# Patient Record
Sex: Male | Born: 1935 | Race: White | Hispanic: No | Smoking: Never smoker
Health system: Southern US, Community
[De-identification: ages and names within clinical notes are randomized; demographics above are authoritative.]

## PROBLEM LIST (undated history)

## (undated) DIAGNOSIS — G454 Transient global amnesia: Secondary | ICD-10-CM

## (undated) DIAGNOSIS — M169 Osteoarthritis of hip, unspecified: Secondary | ICD-10-CM

## (undated) DIAGNOSIS — M179 Osteoarthritis of knee, unspecified: Secondary | ICD-10-CM

## (undated) DIAGNOSIS — M1710 Unilateral primary osteoarthritis, unspecified knee: Secondary | ICD-10-CM

## (undated) DIAGNOSIS — N400 Enlarged prostate without lower urinary tract symptoms: Secondary | ICD-10-CM

## (undated) DIAGNOSIS — C9110 Chronic lymphocytic leukemia of B-cell type not having achieved remission: Secondary | ICD-10-CM

## (undated) DIAGNOSIS — N319 Neuromuscular dysfunction of bladder, unspecified: Secondary | ICD-10-CM

## (undated) DIAGNOSIS — K4090 Unilateral inguinal hernia, without obstruction or gangrene, not specified as recurrent: Secondary | ICD-10-CM

## (undated) DIAGNOSIS — K862 Cyst of pancreas: Secondary | ICD-10-CM

## (undated) DIAGNOSIS — M47812 Spondylosis without myelopathy or radiculopathy, cervical region: Secondary | ICD-10-CM

## (undated) DIAGNOSIS — N4340 Spermatocele of epididymis, unspecified: Secondary | ICD-10-CM

## (undated) DIAGNOSIS — R718 Other abnormality of red blood cells: Secondary | ICD-10-CM

## (undated) HISTORY — DX: Spermatocele of epididymis, unspecified: N43.40

## (undated) HISTORY — DX: Osteoarthritis of hip, unspecified: M16.9

## (undated) HISTORY — DX: Unilateral inguinal hernia, without obstruction or gangrene, not specified as recurrent: K40.90

## (undated) HISTORY — DX: Benign prostatic hyperplasia without lower urinary tract symptoms: N40.0

## (undated) HISTORY — DX: Spondylosis without myelopathy or radiculopathy, cervical region: M47.812

## (undated) HISTORY — DX: Cyst of pancreas: K86.2

## (undated) HISTORY — PX: COLONOSCOPY: SHX174

## (undated) HISTORY — DX: Chronic lymphocytic leukemia of B-cell type not having achieved remission: C91.10

## (undated) HISTORY — DX: Neuromuscular dysfunction of bladder, unspecified: N31.9

## (undated) HISTORY — DX: Osteoarthritis of knee, unspecified: M17.9

## (undated) HISTORY — PX: BACK SURGERY: SHX140

## (undated) HISTORY — DX: Unilateral primary osteoarthritis, unspecified knee: M17.10

## (undated) HISTORY — DX: Gilbert syndrome: E80.4

## (undated) HISTORY — DX: Other abnormality of red blood cells: R71.8

## (undated) HISTORY — DX: Transient global amnesia: G45.4

---

## 1997-11-24 ENCOUNTER — Inpatient Hospital Stay (HOSPITAL_COMMUNITY): Admission: RE | Admit: 1997-11-24 | Discharge: 1997-11-26 | Payer: Self-pay | Admitting: Urology

## 1997-12-04 ENCOUNTER — Ambulatory Visit (HOSPITAL_COMMUNITY): Admission: RE | Admit: 1997-12-04 | Discharge: 1997-12-04 | Payer: Self-pay | Admitting: Neurosurgery

## 1997-12-30 ENCOUNTER — Observation Stay (HOSPITAL_COMMUNITY): Admission: RE | Admit: 1997-12-30 | Discharge: 1997-12-31 | Payer: Self-pay | Admitting: Neurosurgery

## 2001-03-01 ENCOUNTER — Ambulatory Visit (HOSPITAL_COMMUNITY): Admission: RE | Admit: 2001-03-01 | Discharge: 2001-03-01 | Payer: Self-pay | Admitting: Geriatric Medicine

## 2002-01-13 ENCOUNTER — Encounter: Payer: Self-pay | Admitting: Geriatric Medicine

## 2002-01-13 ENCOUNTER — Encounter: Admission: RE | Admit: 2002-01-13 | Discharge: 2002-01-13 | Payer: Self-pay | Admitting: Geriatric Medicine

## 2002-01-14 ENCOUNTER — Ambulatory Visit (HOSPITAL_COMMUNITY): Admission: RE | Admit: 2002-01-14 | Discharge: 2002-01-14 | Payer: Self-pay | Admitting: Geriatric Medicine

## 2002-03-14 ENCOUNTER — Encounter: Payer: Self-pay | Admitting: Internal Medicine

## 2002-03-14 ENCOUNTER — Encounter: Admission: RE | Admit: 2002-03-14 | Discharge: 2002-03-14 | Payer: Self-pay | Admitting: Internal Medicine

## 2003-05-01 ENCOUNTER — Ambulatory Visit (HOSPITAL_COMMUNITY): Admission: RE | Admit: 2003-05-01 | Discharge: 2003-05-01 | Payer: Self-pay | Admitting: Geriatric Medicine

## 2003-05-20 ENCOUNTER — Ambulatory Visit (HOSPITAL_COMMUNITY): Admission: RE | Admit: 2003-05-20 | Discharge: 2003-05-20 | Payer: Self-pay | Admitting: Gastroenterology

## 2005-03-27 HISTORY — PX: EUS: SHX5427

## 2005-05-31 ENCOUNTER — Encounter: Admission: RE | Admit: 2005-05-31 | Discharge: 2005-05-31 | Payer: Self-pay | Admitting: Geriatric Medicine

## 2005-06-06 ENCOUNTER — Encounter: Admission: RE | Admit: 2005-06-06 | Discharge: 2005-06-06 | Payer: Self-pay | Admitting: Geriatric Medicine

## 2005-06-29 ENCOUNTER — Encounter (INDEPENDENT_AMBULATORY_CARE_PROVIDER_SITE_OTHER): Payer: Self-pay | Admitting: Specialist

## 2005-06-29 ENCOUNTER — Ambulatory Visit (HOSPITAL_COMMUNITY): Admission: RE | Admit: 2005-06-29 | Discharge: 2005-06-29 | Payer: Self-pay | Admitting: Gastroenterology

## 2005-07-03 ENCOUNTER — Ambulatory Visit: Payer: Self-pay | Admitting: Gastroenterology

## 2005-07-26 ENCOUNTER — Encounter: Payer: Self-pay | Admitting: Urology

## 2005-10-23 ENCOUNTER — Ambulatory Visit: Payer: Self-pay | Admitting: Gastroenterology

## 2005-10-24 ENCOUNTER — Ambulatory Visit: Payer: Self-pay | Admitting: Cardiology

## 2005-10-27 ENCOUNTER — Encounter: Admission: RE | Admit: 2005-10-27 | Discharge: 2005-10-27 | Payer: Self-pay | Admitting: Geriatric Medicine

## 2006-04-11 ENCOUNTER — Ambulatory Visit: Payer: Self-pay | Admitting: Gastroenterology

## 2006-04-11 LAB — CONVERTED CEMR LAB
BUN: 17 mg/dL (ref 6–23)
Creatinine, Ser: 1.2 mg/dL (ref 0.4–1.5)

## 2006-04-16 ENCOUNTER — Ambulatory Visit: Payer: Self-pay | Admitting: Internal Medicine

## 2006-05-08 ENCOUNTER — Ambulatory Visit: Payer: Self-pay | Admitting: Gastroenterology

## 2006-08-28 ENCOUNTER — Emergency Department (HOSPITAL_COMMUNITY): Admission: EM | Admit: 2006-08-28 | Discharge: 2006-08-28 | Payer: Self-pay | Admitting: Emergency Medicine

## 2009-05-05 ENCOUNTER — Ambulatory Visit (HOSPITAL_COMMUNITY): Admission: RE | Admit: 2009-05-05 | Discharge: 2009-05-05 | Payer: Self-pay | Admitting: Urology

## 2009-11-03 ENCOUNTER — Encounter: Admission: RE | Admit: 2009-11-03 | Discharge: 2009-11-03 | Payer: Self-pay | Admitting: Geriatric Medicine

## 2010-04-11 LAB — CBC
HCT: 42.7 % (ref 39.0–52.0)
MCH: 32.1 pg (ref 26.0–34.0)
MCHC: 34.7 g/dL (ref 30.0–36.0)
MCV: 92.6 fL (ref 78.0–100.0)
Platelets: 298 10*3/uL (ref 150–400)
RBC: 4.61 MIL/uL (ref 4.22–5.81)
RDW: 12.8 % (ref 11.5–15.5)
WBC: 13.4 10*3/uL — ABNORMAL HIGH (ref 4.0–10.5)

## 2010-04-11 LAB — BASIC METABOLIC PANEL
BUN: 16 mg/dL (ref 6–23)
CO2: 29 mEq/L (ref 19–32)
Calcium: 9.2 mg/dL (ref 8.4–10.5)
Chloride: 99 mEq/L (ref 96–112)
Creatinine, Ser: 1.1 mg/dL (ref 0.4–1.5)
GFR calc Af Amer: >60 mL/min (ref >60)
GFR calc non Af Amer: >60 mL/min (ref >60)
Glucose, Bld: 80 mg/dL (ref 70–99)
Potassium: 3.8 mEq/L (ref 3.5–5.1)
Sodium: 136 mEq/L (ref 135–145)

## 2010-04-11 LAB — SURGICAL PCR SCREEN
MRSA, PCR: NEGATIVE
Staphylococcus aureus: NEGATIVE

## 2010-04-15 ENCOUNTER — Ambulatory Visit (HOSPITAL_COMMUNITY)
Admission: RE | Admit: 2010-04-15 | Discharge: 2010-04-15 | Payer: Self-pay | Source: Home / Self Care | Attending: Urology | Admitting: Urology

## 2010-04-17 NOTE — Op Note (Signed)
  [name], Christopher Fowler               ACCOUNT NO.:  [id number]  MEDICAL RECORD NO.:  [id number]          PATIENT TYPE:  AMB  LOCATION:  DAY                          FACILITY:  [facility]  PHYSICIAN:  Heloise Purpura, MD      DATE OF BIRTH:  14-Feb-1925  DATE OF PROCEDURE:  04/15/2010 DATE OF DISCHARGE:                              OPERATIVE REPORT   PREOPERATIVE DIAGNOSIS:  Recurrent balanitis.  POSTOPERATIVE DIAGNOSIS:  Recurrent balanitis.  PROCEDURE:  Adult circumcision.  SURGEON:  Heloise Purpura, M.D.  ANESTHESIA:  General.  COMPLICATIONS:  None.  ESTIMATED BLOOD LOSS:  Minimal.  INTRAVENOUS FLUIDS:  800 mL of crystalloid.  INDICATIONS:  [name] is an 77 year old gentleman with recent recurrent balanitis.  We discussed options for long-term management, including circumcision.  He elected to proceed and, after discussion regarding the potential risks and benefits, he gave [place] his informed consent.  DESCRIPTION OF PROCEDURE:  The patient was taken to the operating room and a general anesthetic was administered.  He was given preoperative antibiotics, placed in the supine position, and his genitalia was prepped and draped in the usual sterile fashion.  Next, a preoperative time-out was performed.  The foreskin was then examined and pulled over the glans of the penis.  The proximal and distal incision sites were then marked appropriately in a circumferential fashion.  A knife was then used to incise the skin at each of these marked incision sites circumferentially and Strully scissors were placed under the foreskin to be resected and this was divided in the midline.  Mosquito clamps were then placed on the edges of the foreskin to be resected and a Bovie electrode was used to excise the skin from the underlying dartos fascia. Hemostasis was then achieved with electrocautery where necessary.  The skin edges were then reapproximated proximally and distally at the 12, 3, 6 and 9  o'clock positions with interrupted 3-0 chromic sutures. Running 3-0 chromic sutures were then used to close the remaining incisions in each quadrant.  Xeroform gauze was then applied with a Kling dressing and Coban to keep it in place.  A penile block was then performed with 10 mL of 1% plain lidocaine.  The patient tolerated the procedure well and without complications.  He was able to be extubated and transferred to the recovery unit in satisfactory condition.     Heloise Purpura, MD     LB/MEDQ  D:  04/15/2010  T:  04/15/2010  Job:  [id number]  Electronically Signed by Heloise Purpura MD on 04/17/2010 05:27:26 PM

## 2010-06-15 LAB — CBC
HCT: 43.9 % (ref 39.0–52.0)
Hemoglobin: 14.9 g/dL (ref 13.0–17.0)
MCHC: 34 g/dL (ref 30.0–36.0)
MCV: 95.9 fL (ref 78.0–100.0)
Platelets: 260 10*3/uL (ref 150–400)
RBC: 4.57 MIL/uL (ref 4.22–5.81)
RDW: 13.5 % (ref 11.5–15.5)
WBC: 10.9 10*3/uL — ABNORMAL HIGH (ref 4.0–10.5)

## 2010-08-12 NOTE — Assessment & Plan Note (Signed)
[facility]                         GASTROENTEROLOGY OFFICE NOTE   NAME:Christopher Fowler, Christopher Fowler                      MRN:          [id number]  DATE:05/08/2006                            DOB:          1924/07/07    PRIMARY CARE PHYSICIAN:  Dr. Ann Maki T. Fowler.   GASTROINTESTINAL PROBLEM LIST:  1. Incidentally found tail of pancreas cyst.  The original computed      tomography scan March 2007 found 1.8-cm x 2.1-cm cystic lesion with      nearby punctate calcifications.  Asymptomatic.  Then EUS April 2007      similar cyst with calcifications, not enough fluid to send for      tumor markers or amylase, but FNA showed no neoplastic cells.      Computed tomography scans July 2007 as well as January 2008 showed      no growth in this lesion.   INTERVAL HISTORY:  I last saw Christopher Fowler at the time of his upper  endoscopic ultrasound about 10 months ago.  Since then 2 serial CT scans  have shown no growth of the cystic lesion in the tail of his pancreas.  He has been asymptomatic.   CURRENT MEDICATIONS:  Avodart, hydrochlorothiazide, Lexapro, Ambien,  multivitamin, aspirin.   PHYSICAL EXAMINATION:  Height 6 foot 1 inches, weight 181 pounds, blood  pressure 140/78, pulse 84.  CONSTITUTIONAL:  Generally well-appearing.  ABDOMEN:  Soft, nontender, nondistended, normal bowel sounds.   ASSESSMENT/PLAN:  A 77 year old man with incidental small tail of  pancreas cyst.   Imaging over approximately one year has shown no growth of this cyst.  The USFNA showed no dysplastic cells.  I suspect this is a serous, non-  neoplastic process and given that this was picked up incidentally and it  has shown no growth, and that FNA showed no sign of malignant cells.  I  think he could just be followed clinically without need for routine  imaging.     Rachael Fee, MD  Electronically Signed    DPJ/MedQ  DD: 05/08/2006  DT: 05/08/2006  Job #: [id number]   cc:   Christopher T.  Fowler, M.D.

## 2010-08-12 NOTE — Op Note (Signed)
NAME:  Christopher Fowler, Christopher Fowler                         ACCOUNT NO.:  [id number]   MEDICAL RECORD NO.:  [id number]                   PATIENT TYPE:  AMB   LOCATION:  ENDO                                 FACILITY:  [facility]   PHYSICIAN:  Danise Edge, M.D.                DATE OF BIRTH:  Jul 12, 1924   DATE OF PROCEDURE:  05/20/2003  DATE OF DISCHARGE:                                 OPERATIVE REPORT   PROCEDURE:  Screening colonoscopy.   REFERRED BY:  Hal T. Stoneking, M.D.   INDICATIONS FOR PROCEDURE:  Christopher Fowler is a 77 year old male born  1924/05/06.  Christopher Fowler health maintenance flexible proctosigmoidoscopy  in 2000 was normal.  He is scheduled to undergo his first screening  colonoscopy with polypectomy to prevent colon cancer.   ENDOSCOPIST:  Danise Edge, M.D.   PREMEDICATION:  Versed 5 mg, Demerol 50 mg.   DESCRIPTION OF PROCEDURE:  After obtaining informed consent, Christopher Fowler was  placed in the left lateral decubitus position. I administered intravenous  Demerol and intravenous Versed to achieve conscious sedation for the  procedure. The patient's blood pressure, oxygen saturation and cardiac  rhythm were monitored throughout the procedure and documented in the medical  record.   Anal inspection was normal. Digital rectal exam revealed a nonnodular  prostate.  The Olympus adjustable pediatric colonoscope was introduced into  the rectum and advanced to the cecum. Colonic preparation for the exam today  was excellent.   RECTUM:  Normal.   SIGMOID COLON AND DESCENDING COLON:  Normal.   SPLENIC FLEXURE:  Normal.   TRANSVERSE COLON:  Normal.   HEPATIC FLEXURE:  Normal.   ASCENDING COLON:  Normal.   CECUM AND ILEOCECAL VALVE:  Normal.   ASSESSMENT:  Normal screening proctocolonoscopy to the cecum.  No endoscopic  evidence for the presence of colorectal neoplasia.                                               Danise Edge, M.D.    MJ/MEDQ  D:  05/20/2003   T:  05/20/2003  Job:  [id number]   cc:   Hal T. Stoneking, M.D.  [address] [place], [place] [address]  Fax: [phone]

## 2011-01-12 LAB — COMPREHENSIVE METABOLIC PANEL
ALT: 20
AST: 30
Albumin: 3.3 — ABNORMAL LOW
Alkaline Phosphatase: 51
BUN: 15
CO2: 27
Calcium: 8.3 — ABNORMAL LOW
Chloride: 97
Creatinine, Ser: 1.02
GFR calc Af Amer: >60
GFR calc non Af Amer: >60
Glucose, Bld: 128 — ABNORMAL HIGH
Potassium: 3.2 — ABNORMAL LOW
Sodium: 134 — ABNORMAL LOW
Total Bilirubin: 1.7 — ABNORMAL HIGH
Total Protein: 5.9 — ABNORMAL LOW

## 2011-01-12 LAB — URINALYSIS, ROUTINE W REFLEX MICROSCOPIC
Bilirubin Urine: NEGATIVE
Glucose, UA: NEGATIVE
Hgb urine dipstick: NEGATIVE
Ketones, ur: NEGATIVE
Nitrite: NEGATIVE
Protein, ur: NEGATIVE
Specific Gravity, Urine: 1.017
Urobilinogen, UA: 1
pH: 7.5

## 2011-01-12 LAB — DIFFERENTIAL
Basophils Absolute: 0
Basophils Relative: 0
Eosinophils Absolute: 0
Eosinophils Relative: 0
Lymphocytes Relative: 9 — ABNORMAL LOW
Lymphs Abs: 1
Monocytes Absolute: 0.9 — ABNORMAL HIGH
Monocytes Relative: 7
Neutro Abs: 10.1 — ABNORMAL HIGH
Neutrophils Relative %: 84 — ABNORMAL HIGH

## 2011-01-12 LAB — CBC
HCT: 42.4
Hemoglobin: 14.7
MCHC: 34.5
MCV: 93.1
Platelets: 261
RBC: 4.56
RDW: 13.1
WBC: 12.1 — ABNORMAL HIGH

## 2011-05-22 DIAGNOSIS — L570 Actinic keratosis: Secondary | ICD-10-CM | POA: Diagnosis not present

## 2011-05-22 DIAGNOSIS — L821 Other seborrheic keratosis: Secondary | ICD-10-CM | POA: Diagnosis not present

## 2011-05-22 DIAGNOSIS — L578 Other skin changes due to chronic exposure to nonionizing radiation: Secondary | ICD-10-CM | POA: Diagnosis not present

## 2011-05-29 DIAGNOSIS — F411 Generalized anxiety disorder: Secondary | ICD-10-CM | POA: Diagnosis not present

## 2011-07-24 ENCOUNTER — Other Ambulatory Visit: Payer: Self-pay | Admitting: Geriatric Medicine

## 2011-07-24 DIAGNOSIS — Z0000 Encounter for general adult medical examination without abnormal findings: Secondary | ICD-10-CM | POA: Diagnosis not present

## 2011-07-24 DIAGNOSIS — E041 Nontoxic single thyroid nodule: Secondary | ICD-10-CM | POA: Diagnosis not present

## 2011-07-24 DIAGNOSIS — Z1331 Encounter for screening for depression: Secondary | ICD-10-CM | POA: Diagnosis not present

## 2011-07-24 DIAGNOSIS — I10 Essential (primary) hypertension: Secondary | ICD-10-CM | POA: Diagnosis not present

## 2011-07-24 DIAGNOSIS — Z79899 Other long term (current) drug therapy: Secondary | ICD-10-CM | POA: Diagnosis not present

## 2011-07-28 ENCOUNTER — Ambulatory Visit
Admission: RE | Admit: 2011-07-28 | Discharge: 2011-07-28 | Disposition: A | Discharge status: Home / Self Care | Payer: Medicare Other | Source: Ambulatory Visit | Attending: Geriatric Medicine | Admitting: Geriatric Medicine

## 2011-07-28 DIAGNOSIS — E042 Nontoxic multinodular goiter: Secondary | ICD-10-CM | POA: Diagnosis not present

## 2011-07-28 DIAGNOSIS — E041 Nontoxic single thyroid nodule: Secondary | ICD-10-CM

## 2011-09-06 DIAGNOSIS — N35919 Unspecified urethral stricture, male, unspecified site: Secondary | ICD-10-CM | POA: Diagnosis not present

## 2011-09-06 DIAGNOSIS — R339 Retention of urine, unspecified: Secondary | ICD-10-CM | POA: Diagnosis not present

## 2011-09-15 DIAGNOSIS — R319 Hematuria, unspecified: Secondary | ICD-10-CM | POA: Diagnosis not present

## 2011-09-19 DIAGNOSIS — Z79899 Other long term (current) drug therapy: Secondary | ICD-10-CM | POA: Diagnosis not present

## 2011-09-19 DIAGNOSIS — R319 Hematuria, unspecified: Secondary | ICD-10-CM | POA: Diagnosis not present

## 2011-10-26 DIAGNOSIS — F411 Generalized anxiety disorder: Secondary | ICD-10-CM | POA: Diagnosis not present

## 2011-11-13 DIAGNOSIS — H2510 Age-related nuclear cataract, unspecified eye: Secondary | ICD-10-CM | POA: Diagnosis not present

## 2012-01-22 DIAGNOSIS — M542 Cervicalgia: Secondary | ICD-10-CM | POA: Diagnosis not present

## 2012-01-22 DIAGNOSIS — J309 Allergic rhinitis, unspecified: Secondary | ICD-10-CM | POA: Diagnosis not present

## 2012-01-22 DIAGNOSIS — I10 Essential (primary) hypertension: Secondary | ICD-10-CM | POA: Diagnosis not present

## 2012-01-22 DIAGNOSIS — Z79899 Other long term (current) drug therapy: Secondary | ICD-10-CM | POA: Diagnosis not present

## 2012-01-26 DIAGNOSIS — Z79899 Other long term (current) drug therapy: Secondary | ICD-10-CM | POA: Diagnosis not present

## 2012-01-26 DIAGNOSIS — E871 Hypo-osmolality and hyponatremia: Secondary | ICD-10-CM | POA: Diagnosis not present

## 2012-02-09 DIAGNOSIS — Z23 Encounter for immunization: Secondary | ICD-10-CM | POA: Diagnosis not present

## 2012-02-26 DIAGNOSIS — J309 Allergic rhinitis, unspecified: Secondary | ICD-10-CM | POA: Diagnosis not present

## 2012-02-26 DIAGNOSIS — I10 Essential (primary) hypertension: Secondary | ICD-10-CM | POA: Diagnosis not present

## 2012-02-26 DIAGNOSIS — M549 Dorsalgia, unspecified: Secondary | ICD-10-CM | POA: Diagnosis not present

## 2012-03-11 DIAGNOSIS — F411 Generalized anxiety disorder: Secondary | ICD-10-CM | POA: Diagnosis not present

## 2012-05-13 DIAGNOSIS — L821 Other seborrheic keratosis: Secondary | ICD-10-CM | POA: Diagnosis not present

## 2012-05-13 DIAGNOSIS — L570 Actinic keratosis: Secondary | ICD-10-CM | POA: Diagnosis not present

## 2012-05-13 DIAGNOSIS — L578 Other skin changes due to chronic exposure to nonionizing radiation: Secondary | ICD-10-CM | POA: Diagnosis not present

## 2012-05-13 DIAGNOSIS — D1801 Hemangioma of skin and subcutaneous tissue: Secondary | ICD-10-CM | POA: Diagnosis not present

## 2012-05-13 DIAGNOSIS — L819 Disorder of pigmentation, unspecified: Secondary | ICD-10-CM | POA: Diagnosis not present

## 2012-07-29 DIAGNOSIS — Z0000 Encounter for general adult medical examination without abnormal findings: Secondary | ICD-10-CM | POA: Diagnosis not present

## 2012-07-29 DIAGNOSIS — Z1331 Encounter for screening for depression: Secondary | ICD-10-CM | POA: Diagnosis not present

## 2012-07-29 DIAGNOSIS — Z79899 Other long term (current) drug therapy: Secondary | ICD-10-CM | POA: Diagnosis not present

## 2012-08-08 DIAGNOSIS — F411 Generalized anxiety disorder: Secondary | ICD-10-CM | POA: Diagnosis not present

## 2012-09-03 DIAGNOSIS — J309 Allergic rhinitis, unspecified: Secondary | ICD-10-CM | POA: Diagnosis not present

## 2012-09-13 DIAGNOSIS — R339 Retention of urine, unspecified: Secondary | ICD-10-CM | POA: Diagnosis not present

## 2012-09-13 DIAGNOSIS — N35919 Unspecified urethral stricture, male, unspecified site: Secondary | ICD-10-CM | POA: Diagnosis not present

## 2012-11-28 DIAGNOSIS — R198 Other specified symptoms and signs involving the digestive system and abdomen: Secondary | ICD-10-CM | POA: Diagnosis not present

## 2012-12-02 DIAGNOSIS — K5900 Constipation, unspecified: Secondary | ICD-10-CM | POA: Diagnosis not present

## 2012-12-09 DIAGNOSIS — Z1211 Encounter for screening for malignant neoplasm of colon: Secondary | ICD-10-CM | POA: Diagnosis not present

## 2012-12-11 DIAGNOSIS — Z23 Encounter for immunization: Secondary | ICD-10-CM | POA: Diagnosis not present

## 2013-01-06 DIAGNOSIS — F411 Generalized anxiety disorder: Secondary | ICD-10-CM | POA: Diagnosis not present

## 2013-01-31 DIAGNOSIS — I10 Essential (primary) hypertension: Secondary | ICD-10-CM | POA: Diagnosis not present

## 2013-01-31 DIAGNOSIS — C9110 Chronic lymphocytic leukemia of B-cell type not having achieved remission: Secondary | ICD-10-CM | POA: Diagnosis not present

## 2013-01-31 DIAGNOSIS — M542 Cervicalgia: Secondary | ICD-10-CM | POA: Diagnosis not present

## 2013-01-31 DIAGNOSIS — Z79899 Other long term (current) drug therapy: Secondary | ICD-10-CM | POA: Diagnosis not present

## 2013-02-10 DIAGNOSIS — H2510 Age-related nuclear cataract, unspecified eye: Secondary | ICD-10-CM | POA: Diagnosis not present

## 2013-02-19 DIAGNOSIS — L570 Actinic keratosis: Secondary | ICD-10-CM | POA: Diagnosis not present

## 2013-02-19 DIAGNOSIS — Z85828 Personal history of other malignant neoplasm of skin: Secondary | ICD-10-CM | POA: Diagnosis not present

## 2013-02-19 DIAGNOSIS — L723 Sebaceous cyst: Secondary | ICD-10-CM | POA: Diagnosis not present

## 2013-02-19 DIAGNOSIS — D1801 Hemangioma of skin and subcutaneous tissue: Secondary | ICD-10-CM | POA: Diagnosis not present

## 2013-02-19 DIAGNOSIS — L821 Other seborrheic keratosis: Secondary | ICD-10-CM | POA: Diagnosis not present

## 2013-02-19 DIAGNOSIS — L739 Follicular disorder, unspecified: Secondary | ICD-10-CM | POA: Diagnosis not present

## 2013-04-07 DIAGNOSIS — F411 Generalized anxiety disorder: Secondary | ICD-10-CM | POA: Diagnosis not present

## 2013-05-02 DIAGNOSIS — N35919 Unspecified urethral stricture, male, unspecified site: Secondary | ICD-10-CM | POA: Diagnosis not present

## 2013-05-06 ENCOUNTER — Other Ambulatory Visit: Payer: Self-pay | Admitting: Geriatric Medicine

## 2013-05-06 DIAGNOSIS — M542 Cervicalgia: Secondary | ICD-10-CM

## 2013-05-06 DIAGNOSIS — M546 Pain in thoracic spine: Secondary | ICD-10-CM | POA: Diagnosis not present

## 2013-05-06 DIAGNOSIS — C9110 Chronic lymphocytic leukemia of B-cell type not having achieved remission: Secondary | ICD-10-CM | POA: Diagnosis not present

## 2013-05-06 DIAGNOSIS — Z79899 Other long term (current) drug therapy: Secondary | ICD-10-CM | POA: Diagnosis not present

## 2013-05-06 DIAGNOSIS — I10 Essential (primary) hypertension: Secondary | ICD-10-CM | POA: Diagnosis not present

## 2013-05-10 ENCOUNTER — Ambulatory Visit
Admission: RE | Admit: 2013-05-10 | Discharge: 2013-05-10 | Disposition: A | Discharge status: Home / Self Care | Payer: Medicare Other | Source: Ambulatory Visit | Attending: Geriatric Medicine | Admitting: Geriatric Medicine

## 2013-05-10 DIAGNOSIS — M546 Pain in thoracic spine: Secondary | ICD-10-CM | POA: Diagnosis not present

## 2013-05-10 DIAGNOSIS — M542 Cervicalgia: Secondary | ICD-10-CM

## 2013-05-10 DIAGNOSIS — M47812 Spondylosis without myelopathy or radiculopathy, cervical region: Secondary | ICD-10-CM | POA: Diagnosis not present

## 2013-05-23 DIAGNOSIS — M542 Cervicalgia: Secondary | ICD-10-CM | POA: Diagnosis not present

## 2013-05-23 DIAGNOSIS — I10 Essential (primary) hypertension: Secondary | ICD-10-CM | POA: Diagnosis not present

## 2013-05-23 DIAGNOSIS — Z6829 Body mass index (BMI) 29.0-29.9, adult: Secondary | ICD-10-CM | POA: Diagnosis not present

## 2013-05-23 DIAGNOSIS — M47812 Spondylosis without myelopathy or radiculopathy, cervical region: Secondary | ICD-10-CM | POA: Diagnosis not present

## 2013-05-27 DIAGNOSIS — R293 Abnormal posture: Secondary | ICD-10-CM | POA: Diagnosis not present

## 2013-05-27 DIAGNOSIS — M2560 Stiffness of unspecified joint, not elsewhere classified: Secondary | ICD-10-CM | POA: Diagnosis not present

## 2013-05-27 DIAGNOSIS — M542 Cervicalgia: Secondary | ICD-10-CM | POA: Diagnosis not present

## 2013-05-28 DIAGNOSIS — M2560 Stiffness of unspecified joint, not elsewhere classified: Secondary | ICD-10-CM | POA: Diagnosis not present

## 2013-05-28 DIAGNOSIS — R293 Abnormal posture: Secondary | ICD-10-CM | POA: Diagnosis not present

## 2013-05-28 DIAGNOSIS — M542 Cervicalgia: Secondary | ICD-10-CM | POA: Diagnosis not present

## 2013-05-29 DIAGNOSIS — R293 Abnormal posture: Secondary | ICD-10-CM | POA: Diagnosis not present

## 2013-05-29 DIAGNOSIS — M542 Cervicalgia: Secondary | ICD-10-CM | POA: Diagnosis not present

## 2013-05-29 DIAGNOSIS — M2560 Stiffness of unspecified joint, not elsewhere classified: Secondary | ICD-10-CM | POA: Diagnosis not present

## 2013-06-02 DIAGNOSIS — M542 Cervicalgia: Secondary | ICD-10-CM | POA: Diagnosis not present

## 2013-06-02 DIAGNOSIS — M2560 Stiffness of unspecified joint, not elsewhere classified: Secondary | ICD-10-CM | POA: Diagnosis not present

## 2013-06-02 DIAGNOSIS — R293 Abnormal posture: Secondary | ICD-10-CM | POA: Diagnosis not present

## 2013-06-02 DIAGNOSIS — R030 Elevated blood-pressure reading, without diagnosis of hypertension: Secondary | ICD-10-CM | POA: Diagnosis not present

## 2013-06-11 DIAGNOSIS — I10 Essential (primary) hypertension: Secondary | ICD-10-CM | POA: Diagnosis not present

## 2013-06-11 DIAGNOSIS — M542 Cervicalgia: Secondary | ICD-10-CM | POA: Diagnosis not present

## 2013-07-11 DIAGNOSIS — J309 Allergic rhinitis, unspecified: Secondary | ICD-10-CM | POA: Diagnosis not present

## 2013-07-23 DIAGNOSIS — F411 Generalized anxiety disorder: Secondary | ICD-10-CM | POA: Diagnosis not present

## 2013-08-01 ENCOUNTER — Other Ambulatory Visit: Payer: Self-pay | Admitting: Geriatric Medicine

## 2013-08-01 DIAGNOSIS — Z23 Encounter for immunization: Secondary | ICD-10-CM | POA: Diagnosis not present

## 2013-08-01 DIAGNOSIS — Z0000 Encounter for general adult medical examination without abnormal findings: Secondary | ICD-10-CM | POA: Diagnosis not present

## 2013-08-01 DIAGNOSIS — I10 Essential (primary) hypertension: Secondary | ICD-10-CM | POA: Diagnosis not present

## 2013-08-01 DIAGNOSIS — E041 Nontoxic single thyroid nodule: Secondary | ICD-10-CM

## 2013-08-01 DIAGNOSIS — Z1331 Encounter for screening for depression: Secondary | ICD-10-CM | POA: Diagnosis not present

## 2013-08-01 DIAGNOSIS — R634 Abnormal weight loss: Secondary | ICD-10-CM | POA: Diagnosis not present

## 2013-08-01 DIAGNOSIS — Z79899 Other long term (current) drug therapy: Secondary | ICD-10-CM | POA: Diagnosis not present

## 2013-08-04 ENCOUNTER — Ambulatory Visit
Admission: RE | Admit: 2013-08-04 | Discharge: 2013-08-04 | Disposition: A | Discharge status: Home / Self Care | Payer: Medicare Other | Source: Ambulatory Visit | Attending: Geriatric Medicine | Admitting: Geriatric Medicine

## 2013-08-04 DIAGNOSIS — E041 Nontoxic single thyroid nodule: Secondary | ICD-10-CM

## 2013-09-12 DIAGNOSIS — M542 Cervicalgia: Secondary | ICD-10-CM | POA: Diagnosis not present

## 2013-09-12 DIAGNOSIS — R634 Abnormal weight loss: Secondary | ICD-10-CM | POA: Diagnosis not present

## 2013-09-12 DIAGNOSIS — I10 Essential (primary) hypertension: Secondary | ICD-10-CM | POA: Diagnosis not present

## 2013-09-19 DIAGNOSIS — N476 Balanoposthitis: Secondary | ICD-10-CM | POA: Diagnosis not present

## 2013-09-19 DIAGNOSIS — R339 Retention of urine, unspecified: Secondary | ICD-10-CM | POA: Diagnosis not present

## 2013-09-19 DIAGNOSIS — N400 Enlarged prostate without lower urinary tract symptoms: Secondary | ICD-10-CM | POA: Diagnosis not present

## 2013-09-22 DIAGNOSIS — L570 Actinic keratosis: Secondary | ICD-10-CM | POA: Diagnosis not present

## 2013-09-22 DIAGNOSIS — L820 Inflamed seborrheic keratosis: Secondary | ICD-10-CM | POA: Diagnosis not present

## 2013-09-22 DIAGNOSIS — Z85828 Personal history of other malignant neoplasm of skin: Secondary | ICD-10-CM | POA: Diagnosis not present

## 2013-09-24 DIAGNOSIS — F411 Generalized anxiety disorder: Secondary | ICD-10-CM | POA: Diagnosis not present

## 2013-12-10 DIAGNOSIS — Z23 Encounter for immunization: Secondary | ICD-10-CM | POA: Diagnosis not present

## 2013-12-29 DIAGNOSIS — N359 Urethral stricture, unspecified: Secondary | ICD-10-CM | POA: Diagnosis not present

## 2013-12-29 DIAGNOSIS — R339 Retention of urine, unspecified: Secondary | ICD-10-CM | POA: Diagnosis not present

## 2013-12-29 DIAGNOSIS — N400 Enlarged prostate without lower urinary tract symptoms: Secondary | ICD-10-CM | POA: Diagnosis not present

## 2014-01-26 DIAGNOSIS — F411 Generalized anxiety disorder: Secondary | ICD-10-CM | POA: Diagnosis not present

## 2014-02-23 DIAGNOSIS — D225 Melanocytic nevi of trunk: Secondary | ICD-10-CM | POA: Diagnosis not present

## 2014-02-23 DIAGNOSIS — D1801 Hemangioma of skin and subcutaneous tissue: Secondary | ICD-10-CM | POA: Diagnosis not present

## 2014-02-23 DIAGNOSIS — L814 Other melanin hyperpigmentation: Secondary | ICD-10-CM | POA: Diagnosis not present

## 2014-02-23 DIAGNOSIS — L821 Other seborrheic keratosis: Secondary | ICD-10-CM | POA: Diagnosis not present

## 2014-02-23 DIAGNOSIS — L570 Actinic keratosis: Secondary | ICD-10-CM | POA: Diagnosis not present

## 2014-02-23 DIAGNOSIS — Z85828 Personal history of other malignant neoplasm of skin: Secondary | ICD-10-CM | POA: Diagnosis not present

## 2014-02-24 DIAGNOSIS — N390 Urinary tract infection, site not specified: Secondary | ICD-10-CM | POA: Diagnosis not present

## 2014-03-25 DIAGNOSIS — Z79899 Other long term (current) drug therapy: Secondary | ICD-10-CM | POA: Diagnosis not present

## 2014-03-25 DIAGNOSIS — M791 Myalgia: Secondary | ICD-10-CM | POA: Diagnosis not present

## 2014-03-25 DIAGNOSIS — I10 Essential (primary) hypertension: Secondary | ICD-10-CM | POA: Diagnosis not present

## 2014-03-25 DIAGNOSIS — K219 Gastro-esophageal reflux disease without esophagitis: Secondary | ICD-10-CM | POA: Diagnosis not present

## 2014-04-20 DIAGNOSIS — H25043 Posterior subcapsular polar age-related cataract, bilateral: Secondary | ICD-10-CM | POA: Diagnosis not present

## 2014-05-04 DIAGNOSIS — K219 Gastro-esophageal reflux disease without esophagitis: Secondary | ICD-10-CM | POA: Diagnosis not present

## 2014-05-04 DIAGNOSIS — F458 Other somatoform disorders: Secondary | ICD-10-CM | POA: Diagnosis not present

## 2014-05-22 DIAGNOSIS — C4449 Other specified malignant neoplasm of skin of scalp and neck: Secondary | ICD-10-CM | POA: Diagnosis not present

## 2014-05-22 DIAGNOSIS — D492 Neoplasm of unspecified behavior of bone, soft tissue, and skin: Secondary | ICD-10-CM | POA: Diagnosis not present

## 2014-05-22 DIAGNOSIS — Z85828 Personal history of other malignant neoplasm of skin: Secondary | ICD-10-CM | POA: Diagnosis not present

## 2014-05-22 DIAGNOSIS — L820 Inflamed seborrheic keratosis: Secondary | ICD-10-CM | POA: Diagnosis not present

## 2014-06-01 DIAGNOSIS — C490 Malignant neoplasm of connective and soft tissue of head, face and neck: Secondary | ICD-10-CM | POA: Diagnosis not present

## 2014-06-01 DIAGNOSIS — Z85828 Personal history of other malignant neoplasm of skin: Secondary | ICD-10-CM | POA: Diagnosis not present

## 2014-06-01 DIAGNOSIS — C4442 Squamous cell carcinoma of skin of scalp and neck: Secondary | ICD-10-CM | POA: Diagnosis not present

## 2014-06-25 DIAGNOSIS — F411 Generalized anxiety disorder: Secondary | ICD-10-CM | POA: Diagnosis not present

## 2014-07-31 DIAGNOSIS — K219 Gastro-esophageal reflux disease without esophagitis: Secondary | ICD-10-CM | POA: Diagnosis not present

## 2014-08-25 ENCOUNTER — Ambulatory Visit (INDEPENDENT_AMBULATORY_CARE_PROVIDER_SITE_OTHER): Payer: Medicare Other | Admitting: Family Medicine

## 2014-08-25 VITALS — BP 128/72 | HR 81 | Temp 98.4°F | Resp 16 | Ht 72.5 in | Wt 167.8 lb

## 2014-08-25 DIAGNOSIS — W540XXA Bitten by dog, initial encounter: Secondary | ICD-10-CM

## 2014-08-25 DIAGNOSIS — L03113 Cellulitis of right upper limb: Secondary | ICD-10-CM

## 2014-08-25 DIAGNOSIS — T148 Other injury of unspecified body region: Secondary | ICD-10-CM

## 2014-08-25 MED ORDER — AMOXICILLIN-POT CLAVULANATE 875-125 MG PO TABS: 1.0000 | ORAL_TABLET | Freq: Two times a day (BID) | ORAL | Status: DC

## 2014-08-25 MED ORDER — MUPIROCIN 2 % EX OINT: 1.0000 "application " | TOPICAL_OINTMENT | Freq: Three times a day (TID) | CUTANEOUS | Status: DC

## 2014-08-25 NOTE — Progress Notes (Signed)
  Subjective:  Patient ID: Christopher Bile., male    DOB: 04/02/1924  Age: 81 y.o. MRN: [id number]  81 year old man who reached down to pet a dog that his daughter is caring for. The dog has a lot of pain due to cancer. The dog bit the patient on the right hand, the dorsum of the hand overlying the first and second metatarsals and a little around into the webspace area. This happened on Friday night almost 4 days ago. It was a holiday weekend. The patient intended to see his doctor but he found out that his doctor did not care for animal bite injuries and was sent over here. The patient did have a tetanus shot sometime in the last couple of years. The dog is cared for by his daughter who lives in [place]. The dog is a rescue dog that has a malignancy, and she is providing individualize hospice care for the dog. The dog is fully vaccinated. The dog has returned to [place].  Objective:  There are half a dozen bite marks in the back of the right hand. The largest one is about 1 cm in diameter with torn skin that is healing back and is crusted a little. There is some surrounding erythema.. The other marks or less apparent except for one that has a couple of tabs of skin folded back from it. The patient's hand is intact. Assessment & Plan:   Assessment: Dog bite right hand with mild cellulitis  Plan: There are no Patient Instructions on file for this visit.   HOPPER,DAVID, MD 08/25/2014

## 2014-08-25 NOTE — Patient Instructions (Signed)
.  Augmentin 875 twice a day for 1 week  Wash twice daily with soap and water and apply some mupirocin ointment. Continue this until the wound is looking nice and close to resolution  Return if needed  Informed your daughter that the dog needs to be kept under close observation for a minimum of 10-14 days  This is being reported to the health department authorities, and you may be contacted regarding it.

## 2014-09-18 ENCOUNTER — Other Ambulatory Visit: Payer: Self-pay | Admitting: Geriatric Medicine

## 2014-09-18 DIAGNOSIS — I10 Essential (primary) hypertension: Secondary | ICD-10-CM | POA: Diagnosis not present

## 2014-09-18 DIAGNOSIS — E041 Nontoxic single thyroid nodule: Secondary | ICD-10-CM

## 2014-09-18 DIAGNOSIS — N401 Enlarged prostate with lower urinary tract symptoms: Secondary | ICD-10-CM | POA: Diagnosis not present

## 2014-09-18 DIAGNOSIS — Z79899 Other long term (current) drug therapy: Secondary | ICD-10-CM | POA: Diagnosis not present

## 2014-09-18 DIAGNOSIS — R339 Retention of urine, unspecified: Secondary | ICD-10-CM | POA: Diagnosis not present

## 2014-09-18 DIAGNOSIS — Z0000 Encounter for general adult medical examination without abnormal findings: Secondary | ICD-10-CM | POA: Diagnosis not present

## 2014-09-18 DIAGNOSIS — R3916 Straining to void: Secondary | ICD-10-CM | POA: Diagnosis not present

## 2014-09-18 DIAGNOSIS — N359 Urethral stricture, unspecified: Secondary | ICD-10-CM | POA: Diagnosis not present

## 2014-09-18 DIAGNOSIS — C9110 Chronic lymphocytic leukemia of B-cell type not having achieved remission: Secondary | ICD-10-CM | POA: Diagnosis not present

## 2014-09-18 DIAGNOSIS — Z1389 Encounter for screening for other disorder: Secondary | ICD-10-CM | POA: Diagnosis not present

## 2014-09-23 ENCOUNTER — Ambulatory Visit
Admission: RE | Admit: 2014-09-23 | Discharge: 2014-09-23 | Disposition: A | Discharge status: Home / Self Care | Payer: Medicare Other | Source: Ambulatory Visit | Attending: Geriatric Medicine | Admitting: Geriatric Medicine

## 2014-09-23 DIAGNOSIS — E041 Nontoxic single thyroid nodule: Secondary | ICD-10-CM

## 2014-09-23 DIAGNOSIS — E042 Nontoxic multinodular goiter: Secondary | ICD-10-CM | POA: Diagnosis not present

## 2014-10-16 DIAGNOSIS — F411 Generalized anxiety disorder: Secondary | ICD-10-CM | POA: Diagnosis not present

## 2014-12-19 DIAGNOSIS — Z23 Encounter for immunization: Secondary | ICD-10-CM | POA: Diagnosis not present

## 2015-02-25 DIAGNOSIS — D485 Neoplasm of uncertain behavior of skin: Secondary | ICD-10-CM | POA: Diagnosis not present

## 2015-02-25 DIAGNOSIS — L570 Actinic keratosis: Secondary | ICD-10-CM | POA: Diagnosis not present

## 2015-02-25 DIAGNOSIS — L821 Other seborrheic keratosis: Secondary | ICD-10-CM | POA: Diagnosis not present

## 2015-02-25 DIAGNOSIS — D1801 Hemangioma of skin and subcutaneous tissue: Secondary | ICD-10-CM | POA: Diagnosis not present

## 2015-02-25 DIAGNOSIS — L820 Inflamed seborrheic keratosis: Secondary | ICD-10-CM | POA: Diagnosis not present

## 2015-02-25 DIAGNOSIS — Z85828 Personal history of other malignant neoplasm of skin: Secondary | ICD-10-CM | POA: Diagnosis not present

## 2015-03-12 DIAGNOSIS — C9110 Chronic lymphocytic leukemia of B-cell type not having achieved remission: Secondary | ICD-10-CM | POA: Diagnosis not present

## 2015-03-12 DIAGNOSIS — J309 Allergic rhinitis, unspecified: Secondary | ICD-10-CM | POA: Diagnosis not present

## 2015-03-12 DIAGNOSIS — I10 Essential (primary) hypertension: Secondary | ICD-10-CM | POA: Diagnosis not present

## 2015-03-12 DIAGNOSIS — Z79899 Other long term (current) drug therapy: Secondary | ICD-10-CM | POA: Diagnosis not present

## 2015-03-15 DIAGNOSIS — F411 Generalized anxiety disorder: Secondary | ICD-10-CM | POA: Diagnosis not present

## 2015-06-01 DIAGNOSIS — Z0000 Encounter for general adult medical examination without abnormal findings: Secondary | ICD-10-CM | POA: Diagnosis not present

## 2015-06-01 DIAGNOSIS — B965 Pseudomonas (aeruginosa) (mallei) (pseudomallei) as the cause of diseases classified elsewhere: Secondary | ICD-10-CM | POA: Diagnosis not present

## 2015-06-01 DIAGNOSIS — N390 Urinary tract infection, site not specified: Secondary | ICD-10-CM | POA: Diagnosis not present

## 2015-06-01 DIAGNOSIS — R300 Dysuria: Secondary | ICD-10-CM | POA: Diagnosis not present

## 2015-06-11 DIAGNOSIS — C9110 Chronic lymphocytic leukemia of B-cell type not having achieved remission: Secondary | ICD-10-CM | POA: Diagnosis not present

## 2015-08-13 DIAGNOSIS — C9110 Chronic lymphocytic leukemia of B-cell type not having achieved remission: Secondary | ICD-10-CM | POA: Diagnosis not present

## 2015-09-06 DIAGNOSIS — F411 Generalized anxiety disorder: Secondary | ICD-10-CM | POA: Diagnosis not present

## 2015-09-10 DIAGNOSIS — H2513 Age-related nuclear cataract, bilateral: Secondary | ICD-10-CM | POA: Diagnosis not present

## 2015-09-24 DIAGNOSIS — N401 Enlarged prostate with lower urinary tract symptoms: Secondary | ICD-10-CM | POA: Diagnosis not present

## 2015-09-24 DIAGNOSIS — R338 Other retention of urine: Secondary | ICD-10-CM | POA: Diagnosis not present

## 2015-09-24 DIAGNOSIS — N359 Urethral stricture, unspecified: Secondary | ICD-10-CM | POA: Diagnosis not present

## 2015-10-06 DIAGNOSIS — C9110 Chronic lymphocytic leukemia of B-cell type not having achieved remission: Secondary | ICD-10-CM | POA: Diagnosis not present

## 2015-10-19 ENCOUNTER — Encounter: Payer: Self-pay | Admitting: Hematology and Oncology

## 2015-10-19 DIAGNOSIS — Z79899 Other long term (current) drug therapy: Secondary | ICD-10-CM | POA: Diagnosis not present

## 2015-10-19 DIAGNOSIS — I10 Essential (primary) hypertension: Secondary | ICD-10-CM | POA: Diagnosis not present

## 2015-10-19 DIAGNOSIS — E041 Nontoxic single thyroid nodule: Secondary | ICD-10-CM | POA: Diagnosis not present

## 2015-10-19 DIAGNOSIS — F325 Major depressive disorder, single episode, in full remission: Secondary | ICD-10-CM | POA: Diagnosis not present

## 2015-10-19 DIAGNOSIS — Z0000 Encounter for general adult medical examination without abnormal findings: Secondary | ICD-10-CM | POA: Diagnosis not present

## 2015-10-19 DIAGNOSIS — C9110 Chronic lymphocytic leukemia of B-cell type not having achieved remission: Secondary | ICD-10-CM | POA: Diagnosis not present

## 2015-10-20 ENCOUNTER — Other Ambulatory Visit: Payer: Self-pay | Admitting: Geriatric Medicine

## 2015-10-20 DIAGNOSIS — E041 Nontoxic single thyroid nodule: Secondary | ICD-10-CM

## 2015-10-22 ENCOUNTER — Ambulatory Visit
Admission: RE | Admit: 2015-10-22 | Discharge: 2015-10-22 | Disposition: A | Discharge status: Home / Self Care | Payer: Medicare Other | Source: Ambulatory Visit | Attending: Geriatric Medicine | Admitting: Geriatric Medicine

## 2015-10-22 DIAGNOSIS — E042 Nontoxic multinodular goiter: Secondary | ICD-10-CM | POA: Diagnosis not present

## 2015-10-22 DIAGNOSIS — E041 Nontoxic single thyroid nodule: Secondary | ICD-10-CM

## 2015-10-28 ENCOUNTER — Other Ambulatory Visit (HOSPITAL_COMMUNITY)
Admission: RE | Admit: 2015-10-28 | Discharge: 2015-10-28 | Disposition: A | Discharge status: Home / Self Care | Payer: Medicare Other | Source: Ambulatory Visit | Attending: Hematology and Oncology | Admitting: Hematology and Oncology

## 2015-10-28 ENCOUNTER — Encounter: Payer: Self-pay | Admitting: Hematology and Oncology

## 2015-10-28 ENCOUNTER — Ambulatory Visit (HOSPITAL_BASED_OUTPATIENT_CLINIC_OR_DEPARTMENT_OTHER): Payer: Medicare Other

## 2015-10-28 ENCOUNTER — Ambulatory Visit (HOSPITAL_BASED_OUTPATIENT_CLINIC_OR_DEPARTMENT_OTHER): Payer: Medicare Other | Admitting: Hematology and Oncology

## 2015-10-28 ENCOUNTER — Telehealth: Payer: Self-pay | Admitting: Hematology and Oncology

## 2015-10-28 DIAGNOSIS — D72820 Lymphocytosis (symptomatic): Secondary | ICD-10-CM

## 2015-10-28 DIAGNOSIS — D649 Anemia, unspecified: Secondary | ICD-10-CM | POA: Diagnosis not present

## 2015-10-28 DIAGNOSIS — C9110 Chronic lymphocytic leukemia of B-cell type not having achieved remission: Secondary | ICD-10-CM | POA: Diagnosis not present

## 2015-10-28 DIAGNOSIS — D638 Anemia in other chronic diseases classified elsewhere: Secondary | ICD-10-CM

## 2015-10-28 LAB — CBC & DIFF AND RETIC
BASO%: 0.8 % (ref 0.0–2.0)
BASOS ABS: 0.3 10*3/uL — AB (ref 0.0–0.1)
EOS ABS: 0.3 10*3/uL (ref 0.0–0.5)
EOS%: 0.6 % (ref 0.0–7.0)
HEMATOCRIT: 36.9 % — AB (ref 38.4–49.9)
HGB: 12 g/dL — ABNORMAL LOW (ref 13.0–17.1)
IMMATURE RETIC FRACT: 3.1 % (ref 3.00–10.60)
LYMPH%: 86.2 % — AB (ref 14.0–49.0)
MCH: 29.6 pg (ref 27.2–33.4)
MCHC: 32.5 g/dL (ref 32.0–36.0)
MCV: 91.1 fL (ref 79.3–98.0)
MONO#: 0.2 10*3/uL (ref 0.1–0.9)
MONO%: 0.5 % (ref 0.0–14.0)
NEUT#: 5 10*3/uL (ref 1.5–6.5)
NEUT%: 11.9 % — AB (ref 39.0–75.0)
PLATELETS: 144 10*3/uL (ref 140–400)
RBC: 4.05 10*6/uL — ABNORMAL LOW (ref 4.20–5.82)
RDW: 14.5 % (ref 11.0–14.6)
Retic %: 0.77 % — ABNORMAL LOW (ref 0.80–1.80)
Retic Ct Abs: 31.19 10*3/uL — ABNORMAL LOW (ref 34.80–93.90)
WBC: 41.8 10*3/uL — AB (ref 4.0–10.3)
lymph#: 36 10*3/uL — ABNORMAL HIGH (ref 0.9–3.3)

## 2015-10-28 LAB — LACTATE DEHYDROGENASE: LDH: 192 U/L (ref 125–245)

## 2015-10-28 LAB — COMPREHENSIVE METABOLIC PANEL
ALT: 19 U/L (ref 0–55)
AST: 33 U/L (ref 5–34)
Albumin: 3.8 g/dL (ref 3.5–5.0)
Alkaline Phosphatase: 82 U/L (ref 40–150)
Anion Gap: 8 mEq/L (ref 3–11)
BUN: 22.9 mg/dL (ref 7.0–26.0)
CALCIUM: 8.8 mg/dL (ref 8.4–10.4)
CO2: 29 meq/L (ref 22–29)
Chloride: 99 mEq/L (ref 98–109)
Creatinine: 1.2 mg/dL (ref 0.7–1.3)
EGFR: 53 mL/min/{1.73_m2} — AB (ref >90)
Glucose: 97 mg/dl (ref 70–140)
POTASSIUM: 3.8 meq/L (ref 3.5–5.1)
Sodium: 136 mEq/L (ref 136–145)
Total Bilirubin: 1.57 mg/dL — ABNORMAL HIGH (ref 0.20–1.20)
Total Protein: 6.6 g/dL (ref 6.4–8.3)

## 2015-10-28 LAB — TECHNOLOGIST REVIEW

## 2015-10-28 NOTE — Progress Notes (Signed)
[facility] NOTE  Patient Care Team: Lajean Manes, MD as PCP - General (Internal Medicine)  CHIEF COMPLAINTS/PURPOSE OF CONSULTATION:  Chronic leukocytosis, suspect CLL  HISTORY OF PRESENTING ILLNESS:  Christopher Fowler. 82 y.o. male is here because of elevated WBC.  He was found to have abnormal CBC from abnormal CBC from routine blood work monitoring. I have the opportunity to review his CBC from 2008. He had mildly elevated leukocytosis but without lymphocytosis. According to blood work from his primary care physician office over the past 6 months, he had evidence of anemia and leukocytosis with white blood cell count as high as 35.9 and hemoglobin of 12.5 He denies recent infection. The last prescription antibiotics was more than 3 months ago There is not reported symptoms of sinus congestion, cough, urinary frequency/urgency or dysuria, diarrhea, joint swelling/pain or abnormal skin rash.  He had no prior history or diagnosis of cancer. His age appropriate screening programs are up-to-date. The patient has no prior diagnosis of autoimmune disease and was not prescribed corticosteroids related products. MEDICAL HISTORY:  Past Medical History:  Diagnosis Date  . BPH (benign prostatic hyperplasia)   . Degenerative joint disease (DJD) of hip    hip  . DJD (degenerative joint disease) of cervical spine   . DJD (degenerative joint disease) of knee    left  . Elevated MCV   . Gilbert's syndrome   . Hernia, inguinal    left  . Neurogenic bladder   . Pancreatic cyst    2cm negative needle aspiration  . Spermatocele    right  . 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 BACK SURGERY     L4 herniated disk    SOCIAL HISTORY: Social History   Social History  . Marital status: Married    Spouse name: [place]  . Number of children: 1  . Years of education: N/A   Occupational  History  . retired    Social History Main Topics  . Smoking status: Never Smoker  . Smokeless tobacco: Never Used  . Alcohol use 1.8 oz/week    3 Glasses of wine per week  . Drug use: No  . Sexual activity: Not on file   Other Topics Concern  . Not on file   Social History Narrative  . No narrative on file    FAMILY HISTORY: Family History  Problem Relation Age of Onset  . Cancer Mother 54    breast ca  . Cancer Paternal Uncle 34    unknown    ALLERGIES:  is allergic to sulfa antibiotics.  MEDICATIONS:  Current Outpatient Prescriptions  Medication Sig Dispense Refill  . aspirin 81 MG tablet Take 81 mg by mouth daily.    Marland Kitchen escitalopram (LEXAPRO) 20 MG tablet Take 20 mg by mouth daily.    . finasteride (PROSCAR) 5 MG tablet Take 5 mg by mouth daily.    . hydrochlorothiazide (MICROZIDE) 12.5 MG capsule Take 12.5 mg by mouth daily.    . Multiple Vitamins-Minerals (MULTIVITAMIN WITH MINERALS) tablet Take 1 tablet by mouth daily.    . polyethylene glycol (MIRALAX / GLYCOLAX) packet Take 17 g by mouth 2 (two) times daily.    . temazepam (RESTORIL) 30 MG capsule Take 30 mg by mouth at bedtime as needed for sleep.    Marland Kitchen VITAMIN B1-B12 IM Inject into the muscle every 30 (thirty) days.     No current  facility-administered medications for this visit.     REVIEW OF SYSTEMS:   Constitutional: Denies fevers, chills or abnormal night sweats Eyes: Denies blurriness of vision, double vision or watery eyes Ears, nose, mouth, throat, and face: Denies mucositis or sore throat Respiratory: Denies cough, dyspnea or wheezes Cardiovascular: Denies palpitation, chest discomfort or lower extremity swelling Gastrointestinal:  Denies nausea, heartburn or change in bowel habits Skin: Denies abnormal skin rashes Lymphatics: Denies new lymphadenopathy or easy bruising Neurological:Denies numbness, tingling or new weaknesses Behavioral/Psych: Mood is stable, no new changes  All other systems  were reviewed with the patient and are negative.  PHYSICAL EXAMINATION: ECOG PERFORMANCE STATUS: 0 - Asymptomatic  Vitals:   10/28/15 1333  BP: 123/60  Pulse: 91  Resp: 18  Temp: 98.1 F (36.7 C)   Filed Weights   10/28/15 1333  Weight: 166 lb 11.2 oz (75.6 kg)    GENERAL:alert, no distress and comfortable SKIN: skin color, texture, turgor are normal, no rashes or significant lesions EYES: normal, conjunctiva are pink and non-injected, sclera clear OROPHARYNX:no exudate, no erythema and lips, buccal mucosa, and tongue normal  NECK: supple, thyroid normal size, non-tender, without nodularity LYMPH:  no palpable lymphadenopathy in the cervical, axillary or inguinal LUNGS: clear to auscultation and percussion with normal breathing effort HEART: regular rate & rhythm and no murmurs and no lower extremity edema ABDOMEN:abdomen soft, non-tender and normal bowel sounds Musculoskeletal:no cyanosis of digits and no clubbing  PSYCH: alert & oriented x 3 with fluent speech NEURO: no focal motor/sensory deficits  LABORATORY DATA:  I have reviewed the data as listed Recent Results (from the past 2160 hour(s))  CBC & Diff and Retic     Status: Abnormal   Collection Time: 10/28/15  3:07 PM  Result Value Ref Range   WBC 41.8 (H) 4.0 - 10.3 10e3/uL   NEUT# 5.0 1.5 - 6.5 10e3/uL   HGB 12.0 (L) 13.0 - 17.1 g/dL   HCT 36.9 (L) 38.4 - 49.9 %   Platelets 144 140 - 400 10e3/uL   MCV 91.1 79.3 - 98.0 fL   MCH 29.6 27.2 - 33.4 pg   MCHC 32.5 32.0 - 36.0 g/dL   RBC 4.05 (L) 4.20 - 5.82 10e6/uL   RDW 14.5 11.0 - 14.6 %   lymph# 36.0 (H) 0.9 - 3.3 10e3/uL   MONO# 0.2 0.1 - 0.9 10e3/uL   Eosinophils Absolute 0.3 0.0 - 0.5 10e3/uL   Basophils Absolute 0.3 (H) 0.0 - 0.1 10e3/uL   NEUT% 11.9 (L) 39.0 - 75.0 %   LYMPH% 86.2 (H) 14.0 - 49.0 %   MONO% 0.5 0.0 - 14.0 %   EOS% 0.6 0.0 - 7.0 %   BASO% 0.8 0.0 - 2.0 %   Retic % 0.77 (L) 0.80 - 1.80 %   Retic Ct Abs 31.19 (L) 34.80 - 93.90 10e3/uL    Immature Retic Fract 3.10 3.00 - 10.60 %  Comprehensive metabolic panel     Status: Abnormal   Collection Time: 10/28/15  3:07 PM  Result Value Ref Range   Sodium 136 136 - 145 mEq/L   Potassium 3.8 3.5 - 5.1 mEq/L   Chloride 99 98 - 109 mEq/L   CO2 29 22 - 29 mEq/L   Glucose 97 70 - 140 mg/dl    Comment: Glucose reference range is for nonfasting patients. Fasting glucose reference range is 70- 100.   BUN 22.9 7.0 - 26.0 mg/dL   Creatinine 1.2 0.7 - 1.3 mg/dL  Total Bilirubin 1.57 (H) 0.20 - 1.20 mg/dL   Alkaline Phosphatase 82 40 - 150 U/L   AST 33 5 - 34 U/L   ALT 19 0 - 55 U/L   Total Protein 6.6 6.4 - 8.3 g/dL   Albumin 3.8 3.5 - 5.0 g/dL   Calcium 8.8 8.4 - 10.4 mg/dL   Anion Gap 8 3 - 11 mEq/L   EGFR 53 (L) >90 ml/min/1.73 m2    Comment: eGFR is calculated using the CKD-EPI Creatinine Equation (2009)  Lactate dehydrogenase (LDH)     Status: None   Collection Time: 10/28/15  3:07 PM  Result Value Ref Range   LDH 192 125 - 245 U/L  TECHNOLOGIST REVIEW     Status: None   Collection Time: 10/28/15  3:07 PM  Result Value Ref Range   Technologist Review      Large Variant lymphs present, many cleaved and/or with nucleoli    RADIOGRAPHIC STUDIES: I have personally reviewed the radiological images as listed and agreed with the findings in the report. US Thyroid  Result Date: 10/23/2015 CLINICAL DATA:  Follow-up thyroid nodules. EXAM: THYROID ULTRASOUND TECHNIQUE: Ultrasound examination of the thyroid gland and adjacent soft tissues was performed. COMPARISON:  09/23/2014 FINDINGS: Right thyroid lobe Measurements: 5.2 x 1.9 x 1.9 cm. At least three small heterogeneous nodules in the right thyroid lobe. Largest nodule is in the mid aspect and measures 0.7 x 0.4 x 0.3 cm. Nodule along the inferior right thyroid lobe measures 0.6 x 0.5 x 0.4 cm. Left thyroid lobe Measurements: 4.7 x 2.1 x 2.0 cm. Left thyroid tissue is mildly heterogeneous. There is a heterogeneous nodule along the  superior aspect that measures 1.0 x 0.7 x 0.5 cm. This nodule appears to be hypervascular and previously measured 0.9 x 0.5 x 0.8 cm. Nodule was hypervascular on the previous examination. Additional tiny nodules in the left thyroid lobe. Isthmus Thickness: 0.4 cm.  No nodules visualized. Lymphadenopathy Prominent hypoechoic nodules along both sides of the neck. Largest right neck lymph node measures 0.7 cm in short axis. Previously, the largest right neck measured up to 0.5 cm in the short axis. Largest left neck node measures 0.8 cm in the short axis and previously the largest left node measured 0.5 cm in the short axis. IMPRESSION: Bilateral thyroid nodules. No significant change in the dominant nodule in the left thyroid lobe. Prominent bilateral neck lymph nodes. There is concern for interval enlargement of these lymph nodes. Recommend clinical correlation. Lymph nodes may be better characterized with a neck CT. Electronically Signed   By: Markus Daft M.D.   On: 10/23/2015 08:30   ASSESSMENT & PLAN  CLL (chronic lymphocytic leukemia) (HCC) The absolute lymphocytosis with mild anemia is likely related to undiagnosed CLL. We discussed briefly about the natural history of CLL. I will order a full cytometry and FISH for prognosis. I will see him again next week to review test results  Gilbert's syndrome The elevated total bilirubin is likely due to Gilbert's syndrome. This has been present for long time and he is not symptomatic. Recommend close observation only  Anemia due to chronic illness This is likely anemia of chronic disease. The patient denies recent history of bleeding such as epistaxis, hematuria or hematochezia. He is asymptomatic from the anemia. We will observe for now.     Orders Placed This Encounter  Procedures  . CBC & Diff and Retic    Standing Status:   Future    Number  of Occurrences:   1    Standing Expiration Date:   12/01/2016  . Comprehensive metabolic panel     Standing Status:   Future    Number of Occurrences:   1    Standing Expiration Date:   12/01/2016  . Lactate dehydrogenase (LDH)    Standing Status:   Future    Number of Occurrences:   1    Standing Expiration Date:   12/01/2016  . FISH, Peripheral Blood    CLL    Standing Status:   Future    Number of Occurrences:   1    Standing Expiration Date:   12/01/2016  . Flow Cytometry    CLL    Standing Status:   Future    Number of Occurrences:   1    Standing Expiration Date:   12/01/2016    All questions were answered. The patient knows to call the clinic with any problems, questions or concerns. I spent 30 minutes counseling the patient face to face. The total time spent in the appointment was 40 minutes and more than 50% was on counseling.     [facility], Kemal Amores, MD 10/28/2015 4:15 PM

## 2015-10-28 NOTE — Telephone Encounter (Signed)
Gave pt cal & avs °

## 2015-10-29 ENCOUNTER — Encounter: Payer: Self-pay | Admitting: Hematology and Oncology

## 2015-10-29 DIAGNOSIS — D638 Anemia in other chronic diseases classified elsewhere: Secondary | ICD-10-CM | POA: Insufficient documentation

## 2015-10-29 NOTE — Assessment & Plan Note (Signed)
The elevated total bilirubin is likely due to Gilbert's syndrome. This has been present for long time and he is not symptomatic. Recommend close observation only 

## 2015-10-29 NOTE — Assessment & Plan Note (Signed)
This is likely anemia of chronic disease. The patient denies recent history of bleeding such as epistaxis, hematuria or hematochezia. He is asymptomatic from the anemia. We will observe for now.  

## 2015-10-29 NOTE — Assessment & Plan Note (Signed)
The absolute lymphocytosis with mild anemia is likely related to undiagnosed CLL. We discussed briefly about the natural history of CLL. I will order a full cytometry and FISH for prognosis. I will see him again next week to review test results

## 2015-11-02 LAB — FLOW CYTOMETRY

## 2015-11-03 LAB — TISSUE HYBRIDIZATION TO NCBH

## 2015-11-03 LAB — FISH, PERIPHERAL BLOOD

## 2015-11-05 ENCOUNTER — Telehealth: Payer: Self-pay | Admitting: Hematology and Oncology

## 2015-11-05 ENCOUNTER — Ambulatory Visit (HOSPITAL_BASED_OUTPATIENT_CLINIC_OR_DEPARTMENT_OTHER): Payer: Medicare Other | Admitting: Hematology and Oncology

## 2015-11-05 ENCOUNTER — Encounter: Payer: Self-pay | Admitting: Hematology and Oncology

## 2015-11-05 VITALS — BP 128/65 | HR 102 | Temp 98.2°F | Resp 18 | Wt 165.6 lb

## 2015-11-05 DIAGNOSIS — C9110 Chronic lymphocytic leukemia of B-cell type not having achieved remission: Secondary | ICD-10-CM | POA: Diagnosis not present

## 2015-11-05 DIAGNOSIS — D638 Anemia in other chronic diseases classified elsewhere: Secondary | ICD-10-CM | POA: Diagnosis not present

## 2015-11-05 NOTE — Telephone Encounter (Signed)
per pof to sch pt appr-gave pt copy of avs

## 2015-11-05 NOTE — Assessment & Plan Note (Signed)
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 He does not require treatment. We discussed some of the signs and symptoms of watch out for disease progression such as progressive fatigue due to anemia, increasing bruising due to thrombocytopenia, abnormal growth of lymphadenopathy, anorexia or weight loss. I will see him next year around April 2018 for further follow-up with repeat history, examination and blood work

## 2015-11-05 NOTE — Assessment & Plan Note (Signed)
This is likely anemia of chronic disease. The patient denies recent history of bleeding such as epistaxis, hematuria or hematochezia. He is asymptomatic from the anemia. We will observe for now.  

## 2015-11-05 NOTE — Progress Notes (Signed)
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 He returns today. He feels well. He asks a lot of questions regarding diagnosis of CLL. He bruises easily due to aspirin therapy but otherwise no major bleeding complications. The patient denies any recent signs or symptoms of bleeding such as spontaneous epistaxis, hematuria or hematochezia.  REVIEW OF SYSTEMS:   Constitutional: Denies fevers, chills or abnormal weight loss Eyes: Denies blurriness of vision Ears, nose, mouth, throat, and face: Denies mucositis or sore throat Respiratory: Denies cough, dyspnea or wheezes Cardiovascular: Denies palpitation, chest discomfort or lower extremity swelling Gastrointestinal:  Denies nausea, heartburn or change in bowel habits Skin: Denies abnormal skin rashes Lymphatics: Denies new lymphadenopathy  Neurological:Denies numbness, tingling or new weaknesses Behavioral/Psych: Mood is stable, no new changes  All other systems were reviewed with the patient and are negative.  I have reviewed the past medical history, past surgical history, social history and family history with the patient and they are unchanged from previous note.  ALLERGIES:  is allergic to sulfa antibiotics.  MEDICATIONS:  Current Outpatient Prescriptions  Medication Sig Dispense Refill  . aspirin 81 MG tablet Take 81 mg by mouth daily.    Marland Kitchen escitalopram (LEXAPRO) 20 MG tablet Take 20 mg by mouth daily.    . finasteride (PROSCAR) 5 MG tablet Take 5 mg by mouth daily.    . hydrochlorothiazide (MICROZIDE) 12.5 MG capsule Take 12.5 mg by mouth daily.    . Multiple Vitamins-Minerals  (MULTIVITAMIN WITH MINERALS) tablet Take 1 tablet by mouth daily.    . polyethylene glycol (MIRALAX / GLYCOLAX) packet Take 17 g by mouth 2 (two) times daily.    . temazepam (RESTORIL) 30 MG capsule Take 30 mg by mouth at bedtime as needed for sleep.    Marland Kitchen VITAMIN B1-B12 IM Inject into the muscle every 30 (thirty) days.     No current facility-administered medications for this visit.     PHYSICAL EXAMINATION: ECOG PERFORMANCE STATUS: 0 - Asymptomatic  Vitals:   11/05/15 0832  BP: 128/65  Pulse: (!) 102  Resp: 18  Temp: 98.2 F (36.8 C)   Filed Weights   11/05/15 0832  Weight: 165 lb 9.6 oz (75.1 kg)    GENERAL:alert, no distress and comfortable SKIN: he has minor skin bruising EYES: normal, Conjunctiva are pink and non-injected, sclera clear Musculoskeletal:no cyanosis of digits and no clubbing  NEURO: alert & oriented x 3 with fluent speech, no focal motor/sensory deficits  LABORATORY DATA:  I have reviewed the data as listed    Component Value Date/Time   NA 136 10/28/2015 1507   K 3.8 10/28/2015 1507   CL 99 04/06/2010 1045   CO2 29 10/28/2015 1507   GLUCOSE 97 10/28/2015 1507   BUN 22.9 10/28/2015 1507   CREATININE 1.2 10/28/2015 1507   CALCIUM 8.8 10/28/2015 1507   PROT 6.6 10/28/2015 1507   ALBUMIN 3.8 10/28/2015 1507   AST 33 10/28/2015 1507   ALT 19 10/28/2015 1507   ALKPHOS 82 10/28/2015 1507   BILITOT 1.57 (H) 10/28/2015 1507   GFRNONAA >60 04/06/2010 1045   GFRAA  04/06/2010 1045    >60        The eGFR has been calculated using the MDRD equation. This calculation has not been validated in all clinical situations. eGFR's persistently <60 mL/min signify possible Chronic Kidney Disease.    No results found for: SPEP, UPEP  Lab Results  Component Value Date   WBC 41.8 (H) 10/28/2015   NEUTROABS 5.0 10/28/2015   HGB 12.0 (L) 10/28/2015   HCT 36.9 (L) 10/28/2015   MCV 91.1 10/28/2015   PLT 144 10/28/2015      Chemistry      Component  Value Date/Time   NA 136 10/28/2015 1507   K 3.8 10/28/2015 1507   CL 99 04/06/2010 1045   CO2 29 10/28/2015 1507   BUN 22.9 10/28/2015 1507   CREATINE 1.2 10/28/2015 1507      Component Value Date/Time   CALCIUM 8.8 10/28/2015 1507   ALKPHOS 82 10/28/2015 1507   AST 33 10/28/2015 1507   ALT 19 10/28/2015 1507   BILITOT 1.57 (H) 10/28/2015 1507     ASSESSMENT & PLAN:  CLL (chronic lymphocytic leukemia) (HCC) Overall, he has stage 0 disease I believe the anemia is unrelated Deletion 13q is typically associated with slow disease progression. He does not require treatment. We discussed some of the signs and symptoms of watch out for disease progression such as progressive fatigue due to anemia, increasing bruising due to thrombocytopenia, abnormal growth of lymphadenopathy, anorexia or weight loss. I will see him next year around April 2018 for further follow-up with repeat history, examination and blood work  Anemia due to chronic illness This is likely anemia of chronic disease. The patient denies recent history of bleeding such as epistaxis, hematuria or hematochezia. He is asymptomatic from the anemia. We will observe for now.    Orders Placed This Encounter  Procedures  . CBC with Differential/Platelet    Standing Status:   Future    Standing Expiration Date:   12/09/2016   All questions were answered. The patient knows to call the clinic with any problems, questions or concerns. No barriers to learning was detected. I spent 15 minutes counseling the patient face to face. The total time spent in the appointment was 20 minutes and more than 50% was on counseling and review of test results     , , MD 11/05/2015 8:56 AM   

## 2015-11-08 NOTE — Telephone Encounter (Signed)
Pt notified of correct appt.05/04/16 0800

## 2015-12-20 DIAGNOSIS — Z23 Encounter for immunization: Secondary | ICD-10-CM | POA: Diagnosis not present

## 2016-01-31 DIAGNOSIS — F411 Generalized anxiety disorder: Secondary | ICD-10-CM | POA: Diagnosis not present

## 2016-02-23 DIAGNOSIS — I10 Essential (primary) hypertension: Secondary | ICD-10-CM | POA: Diagnosis not present

## 2016-02-23 DIAGNOSIS — F325 Major depressive disorder, single episode, in full remission: Secondary | ICD-10-CM | POA: Diagnosis not present

## 2016-05-04 ENCOUNTER — Telehealth: Payer: Self-pay | Admitting: Hematology and Oncology

## 2016-05-04 ENCOUNTER — Encounter: Payer: Self-pay | Admitting: Hematology and Oncology

## 2016-05-04 ENCOUNTER — Other Ambulatory Visit: Payer: Medicare Other

## 2016-05-04 ENCOUNTER — Ambulatory Visit (HOSPITAL_BASED_OUTPATIENT_CLINIC_OR_DEPARTMENT_OTHER): Payer: Medicare Other | Admitting: Hematology and Oncology

## 2016-05-04 DIAGNOSIS — C8300 Small cell B-cell lymphoma, unspecified site: Secondary | ICD-10-CM | POA: Insufficient documentation

## 2016-05-04 DIAGNOSIS — C9110 Chronic lymphocytic leukemia of B-cell type not having achieved remission: Secondary | ICD-10-CM

## 2016-05-04 DIAGNOSIS — D61818 Other pancytopenia: Secondary | ICD-10-CM | POA: Diagnosis not present

## 2016-05-04 DIAGNOSIS — D649 Anemia, unspecified: Secondary | ICD-10-CM | POA: Diagnosis not present

## 2016-05-04 LAB — CBC WITH DIFFERENTIAL/PLATELET
BASO%: 0.5 % (ref 0.0–2.0)
BASOS ABS: 0.5 10*3/uL — AB (ref 0.0–0.1)
EOS%: 0.5 % (ref 0.0–7.0)
Eosinophils Absolute: 0.5 10*3/uL (ref 0.0–0.5)
HEMATOCRIT: 36.7 % — AB (ref 38.4–49.9)
HEMOGLOBIN: 11.7 g/dL — AB (ref 13.0–17.1)
LYMPH#: 97.9 10*3/uL — AB (ref 0.9–3.3)
LYMPH%: 91.8 % — ABNORMAL HIGH (ref 14.0–49.0)
MCH: 30.6 pg (ref 27.2–33.4)
MCHC: 31.9 g/dL — ABNORMAL LOW (ref 32.0–36.0)
MCV: 95.8 fL (ref 79.3–98.0)
MONO#: 1.5 10*3/uL — ABNORMAL HIGH (ref 0.1–0.9)
MONO%: 1.4 % (ref 0.0–14.0)
NEUT#: 6.1 10*3/uL (ref 1.5–6.5)
NEUT%: 5.8 % — ABNORMAL LOW (ref 39.0–75.0)
Platelets: 127 10*3/uL — ABNORMAL LOW (ref 140–400)
RBC: 3.83 10*6/uL — ABNORMAL LOW (ref 4.20–5.82)
RDW: 15.1 % — ABNORMAL HIGH (ref 11.0–14.6)
WBC: 106.6 10*3/uL (ref 4.0–10.3)

## 2016-05-04 LAB — TECHNOLOGIST REVIEW

## 2016-05-04 NOTE — Telephone Encounter (Signed)
Appointments scheduled per 05/04/16 los. Patient was given a copy of the AVS report and appointment schedule, per 05/04/16 los. ° °

## 2016-05-04 NOTE — Assessment & Plan Note (Signed)
He is not symptomatic from anemia or thrombocytopenia. Observe only for now 

## 2016-05-04 NOTE — Assessment & Plan Note (Signed)
He has significant signs of disease progression with more than doubling of his white blood cell count in 6 months and associated pancytopenia. He had mild night sweats. I recommend PET/CT scan imaging. If PET scan show no signs of aggressive lymphoma, we will proceed with treatment. I briefly discussed choices of treatment including oral agents versus infusional treatment. Due to his financial situation, he prefers infusion treatment. I will bring him back again in about 2 weeks to review all test results. My plan would be to prescribe Gazyva single agent only I will arrange for further blood work and chemotherapy education class on his return visit

## 2016-05-04 NOTE — Progress Notes (Signed)
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 He returns for follow-up. He noted occasional night sweats. He denies palpable lymphadenopathy himself. No recent anorexia or weight loss. Denies recent infection. The patient denies any recent signs or symptoms of bleeding such as spontaneous epistaxis, hematuria or hematochezia.  REVIEW OF SYSTEMS:   Constitutional: Denies fevers, chills or abnormal weight loss Eyes: Denies blurriness of vision Ears, nose, mouth, throat, and face: Denies mucositis or sore throat Respiratory: Denies cough, dyspnea or wheezes Cardiovascular: Denies palpitation, chest discomfort or lower extremity swelling Gastrointestinal:  Denies nausea, heartburn or change in bowel habits Skin: Denies abnormal skin rashes Lymphatics: Denies new lymphadenopathy or easy bruising Neurological:Denies numbness, tingling or new weaknesses Behavioral/Psych: Mood is stable, no new changes  All other systems were reviewed with the patient and are negative.  I have reviewed the past medical history, past surgical history, social history and family history with the patient and they are unchanged from previous note.  ALLERGIES:  is allergic to sulfa antibiotics.  MEDICATIONS:  Current Outpatient Prescriptions  Medication Sig Dispense Refill  . aspirin 81 MG tablet Take 81 mg by mouth daily.    Marland Kitchen escitalopram (LEXAPRO) 20 MG tablet Take 20 mg by mouth daily.    . finasteride (PROSCAR) 5 MG tablet Take 5 mg by mouth daily.    . Multiple Vitamins-Minerals (MULTIVITAMIN WITH MINERALS) tablet Take 1 tablet by mouth daily.    .  polyethylene glycol (MIRALAX / GLYCOLAX) packet Take 17 g by mouth 2 (two) times daily.    . temazepam (RESTORIL) 30 MG capsule Take 30 mg by mouth at bedtime as needed for sleep.    Marland Kitchen VITAMIN B1-B12 IM Inject into the muscle every 30 (thirty) days.    Marland Kitchen LORazepam (ATIVAN) 0.5 MG tablet      No current facility-administered medications for this visit.     PHYSICAL EXAMINATION: ECOG PERFORMANCE STATUS: 1 - Symptomatic but completely ambulatory  Vitals:   05/04/16 0821  BP: 117/60  Pulse: 74  Resp: 18  Temp: 98.7 F (37.1 C)   Filed Weights   05/04/16 0821  Weight: 161 lb 14.4 oz (73.4 kg)    GENERAL:alert, no distress and comfortable SKIN: skin color, texture, turgor are normal, no rashes or significant lesions EYES: normal, Conjunctiva are pink and non-injected, sclera clear OROPHARYNX:no exudate, no erythema and lips, buccal mucosa, and tongue normal  NECK: supple, thyroid normal size, non-tender, without nodularity LYMPH:  He has palpable lymphadenopathy in bilateral inguinal region  LUNGS: clear to auscultation and percussion with normal breathing effort HEART: regular rate & rhythm and no murmurs and no lower extremity edema ABDOMEN:abdomen soft, non-tender and normal bowel sounds Musculoskeletal:no cyanosis of digits and no clubbing  NEURO: alert & oriented x 3 with fluent speech, no focal motor/sensory deficits  LABORATORY DATA:  I have reviewed the data as listed    Component Value Date/Time   NA 136 10/28/2015 1507   K 3.8 10/28/2015 1507   CL 99 04/06/2010 1045   CO2 29 10/28/2015 1507   GLUCOSE 97 10/28/2015 1507   BUN  22.9 10/28/2015 1507   CREATININE 1.2 10/28/2015 1507   CALCIUM 8.8 10/28/2015 1507   PROT 6.6 10/28/2015 1507   ALBUMIN 3.8 10/28/2015 1507   AST 33 10/28/2015 1507   ALT 19 10/28/2015 1507   ALKPHOS 82 10/28/2015 1507   BILITOT 1.57 (H) 10/28/2015 1507   GFRNONAA >60 04/06/2010 1045   GFRAA  04/06/2010 1045    >60        The eGFR has  been calculated using the MDRD equation. This calculation has not been validated in all clinical situations. eGFR's persistently <60 mL/min signify possible Chronic Kidney Disease.    No results found for: SPEP, UPEP  Lab Results  Component Value Date   WBC 41.8 (H) 10/28/2015   NEUTROABS 5.0 10/28/2015   HGB 12.0 (L) 10/28/2015   HCT 36.9 (L) 10/28/2015   MCV 91.1 10/28/2015   PLT 144 10/28/2015      Chemistry      Component Value Date/Time   NA 136 10/28/2015 1507   K 3.8 10/28/2015 1507   CL 99 04/06/2010 1045   CO2 29 10/28/2015 1507   BUN 22.9 10/28/2015 1507   CREATININE 1.2 10/28/2015 1507      Component Value Date/Time   CALCIUM 8.8 10/28/2015 1507   ALKPHOS 82 10/28/2015 1507   AST 33 10/28/2015 1507   ALT 19 10/28/2015 1507   BILITOT 1.57 (H) 10/28/2015 1507       ASSESSMENT & PLAN:  Lymphoma, small lymphocytic (HCC) He has significant signs of disease progression with more than doubling of his white blood cell count in 6 months and associated pancytopenia. He had mild night sweats. I recommend PET/CT scan imaging. If PET scan show no signs of aggressive lymphoma, we will proceed with treatment. I briefly discussed choices of treatment including oral agents versus infusional treatment. Due to his financial situation, he prefers infusion treatment. I will bring him back again in about 2 weeks to review all test results. My plan would be to prescribe Gazyva single agent only I will arrange for further blood work and chemotherapy education class on his return visit  Pancytopenia, acquired [facility]) He is not symptomatic from anemia or thrombocytopenia. Observe only for now   Orders Placed This Encounter  Procedures  . NM PET Image Initial (PI) Skull Base To Thigh    Standing Status:   Future    Standing Expiration Date:   07/04/2017    Order Specific Question:   Reason for Exam (SYMPTOM  OR DIAGNOSIS REQUIRED)    Answer:   staging lymphoma    Order  Specific Question:   Preferred imaging location?    Answer:   [facility]  . Comprehensive metabolic panel    Standing Status:   Future    Standing Expiration Date:   06/08/2017  . CBC with Differential/Platelet    Standing Status:   Future    Standing Expiration Date:   06/08/2017  . Lactate dehydrogenase (LDH)    Standing Status:   Future    Standing Expiration Date:   06/08/2017  . Hepatitis B core antibody, IgM    Standing Status:   Future    Standing Expiration Date:   06/08/2017  . Hepatitis B surface antibody    Standing Status:   Future    Standing Expiration Date:   06/08/2017  . Hepatitis B surface antigen    Standing Status:   Future    Standing Expiration Date:   06/08/2017  . Uric  acid    Standing Status:   Future    Standing Expiration Date:   06/08/2017   All questions were answered. The patient knows to call the clinic with any problems, questions or concerns. No barriers to learning was detected. I spent 25 minutes counseling the patient face to face. The total time spent in the appointment was 40 minutes and more than 50% was on counseling and review of test results     [name], MD 05/04/2016 8:51 AM

## 2016-05-11 ENCOUNTER — Encounter: Payer: Self-pay | Admitting: Hematology and Oncology

## 2016-05-12 ENCOUNTER — Telehealth: Payer: Self-pay

## 2016-05-12 NOTE — Telephone Encounter (Signed)
Patient called back, he left message that he still does not have his PET scan appointment. Sent message to [place] for authorization. Called patient and told him we are still working on it, he has appointment with Dr. Alvy Bimler on 2-20.

## 2016-05-16 ENCOUNTER — Other Ambulatory Visit: Payer: Medicare Other

## 2016-05-16 ENCOUNTER — Telehealth: Payer: Self-pay | Admitting: *Deleted

## 2016-05-16 ENCOUNTER — Ambulatory Visit: Payer: Medicare Other | Admitting: Hematology and Oncology

## 2016-05-16 NOTE — Telephone Encounter (Signed)
Spoke with patient regarding schedule. Pt verbalized understanding  05/24/16  0730- arrive for PET, NPO after [place]- [place] 1000 Chemo Education 1200 lab 1300 Dr Alvy Bimler  Msg to scheduler

## 2016-05-24 ENCOUNTER — Other Ambulatory Visit: Payer: Medicare Other

## 2016-05-24 ENCOUNTER — Other Ambulatory Visit (HOSPITAL_BASED_OUTPATIENT_CLINIC_OR_DEPARTMENT_OTHER): Payer: Medicare Other

## 2016-05-24 ENCOUNTER — Ambulatory Visit (HOSPITAL_COMMUNITY)
Admission: RE | Admit: 2016-05-24 | Discharge: 2016-05-24 | Disposition: A | Discharge status: Home / Self Care | Payer: Medicare Other | Source: Ambulatory Visit | Attending: Hematology and Oncology | Admitting: Hematology and Oncology

## 2016-05-24 ENCOUNTER — Encounter: Payer: Self-pay | Admitting: *Deleted

## 2016-05-24 ENCOUNTER — Telehealth: Payer: Self-pay | Admitting: Hematology and Oncology

## 2016-05-24 ENCOUNTER — Encounter: Payer: Self-pay | Admitting: Hematology and Oncology

## 2016-05-24 ENCOUNTER — Ambulatory Visit (HOSPITAL_BASED_OUTPATIENT_CLINIC_OR_DEPARTMENT_OTHER): Payer: Medicare Other | Admitting: Hematology and Oncology

## 2016-05-24 VITALS — BP 123/66 | HR 88 | Temp 98.5°F | Resp 18 | Ht 72.5 in | Wt 158.3 lb

## 2016-05-24 DIAGNOSIS — I2510 Atherosclerotic heart disease of native coronary artery without angina pectoris: Secondary | ICD-10-CM | POA: Insufficient documentation

## 2016-05-24 DIAGNOSIS — C9110 Chronic lymphocytic leukemia of B-cell type not having achieved remission: Secondary | ICD-10-CM

## 2016-05-24 DIAGNOSIS — N433 Hydrocele, unspecified: Secondary | ICD-10-CM | POA: Insufficient documentation

## 2016-05-24 DIAGNOSIS — M47896 Other spondylosis, lumbar region: Secondary | ICD-10-CM | POA: Insufficient documentation

## 2016-05-24 DIAGNOSIS — J320 Chronic maxillary sinusitis: Secondary | ICD-10-CM | POA: Diagnosis not present

## 2016-05-24 DIAGNOSIS — I708 Atherosclerosis of other arteries: Secondary | ICD-10-CM | POA: Diagnosis not present

## 2016-05-24 DIAGNOSIS — D61818 Other pancytopenia: Secondary | ICD-10-CM | POA: Diagnosis not present

## 2016-05-24 DIAGNOSIS — I700 Atherosclerosis of aorta: Secondary | ICD-10-CM | POA: Diagnosis not present

## 2016-05-24 DIAGNOSIS — M12852 Other specific arthropathies, not elsewhere classified, left hip: Secondary | ICD-10-CM | POA: Insufficient documentation

## 2016-05-24 DIAGNOSIS — Z7189 Other specified counseling: Secondary | ICD-10-CM | POA: Insufficient documentation

## 2016-05-24 DIAGNOSIS — M12851 Other specific arthropathies, not elsewhere classified, right hip: Secondary | ICD-10-CM | POA: Insufficient documentation

## 2016-05-24 DIAGNOSIS — C8300 Small cell B-cell lymphoma, unspecified site: Secondary | ICD-10-CM

## 2016-05-24 DIAGNOSIS — R161 Splenomegaly, not elsewhere classified: Secondary | ICD-10-CM | POA: Insufficient documentation

## 2016-05-24 DIAGNOSIS — C8590 Non-Hodgkin lymphoma, unspecified, unspecified site: Secondary | ICD-10-CM | POA: Diagnosis not present

## 2016-05-24 LAB — URIC ACID: Uric Acid, Serum: 5.2 mg/dl (ref 2.6–7.4)

## 2016-05-24 LAB — CBC WITH DIFFERENTIAL/PLATELET
BASO%: 0.2 % (ref 0.0–2.0)
Basophils Absolute: 0.2 10*3/uL — ABNORMAL HIGH (ref 0.0–0.1)
EOS ABS: 0.1 10*3/uL (ref 0.0–0.5)
EOS%: 0.1 % (ref 0.0–7.0)
HEMATOCRIT: 36.3 % — AB (ref 38.4–49.9)
HEMOGLOBIN: 11.8 g/dL — AB (ref 13.0–17.1)
LYMPH#: 95 10*3/uL — AB (ref 0.9–3.3)
LYMPH%: 95.8 % — ABNORMAL HIGH (ref 14.0–49.0)
MCH: 31.1 pg (ref 27.2–33.4)
MCHC: 32.5 g/dL (ref 32.0–36.0)
MCV: 95.8 fL (ref 79.3–98.0)
MONO#: 0.9 10*3/uL (ref 0.1–0.9)
MONO%: 0.9 % (ref 0.0–14.0)
NEUT%: 3 % — ABNORMAL LOW (ref 39.0–75.0)
NEUTROS ABS: 3 10*3/uL (ref 1.5–6.5)
PLATELETS: 159 10*3/uL (ref 140–400)
RBC: 3.79 10*6/uL — ABNORMAL LOW (ref 4.20–5.82)
RDW: 14.9 % — AB (ref 11.0–14.6)
WBC: 99.1 10*3/uL — AB (ref 4.0–10.3)

## 2016-05-24 LAB — COMPREHENSIVE METABOLIC PANEL
ALT: 20 U/L (ref 0–55)
AST: 35 U/L — AB (ref 5–34)
Albumin: 4.1 g/dL (ref 3.5–5.0)
Alkaline Phosphatase: 99 U/L (ref 40–150)
Anion Gap: 8 mEq/L (ref 3–11)
BUN: 21.5 mg/dL (ref 7.0–26.0)
CALCIUM: 9 mg/dL (ref 8.4–10.4)
CHLORIDE: 103 meq/L (ref 98–109)
CO2: 28 mEq/L (ref 22–29)
Creatinine: 1.2 mg/dL (ref 0.7–1.3)
EGFR: 53 mL/min/{1.73_m2} — ABNORMAL LOW (ref >90)
Glucose: 104 mg/dl (ref 70–140)
POTASSIUM: 4.1 meq/L (ref 3.5–5.1)
Sodium: 138 mEq/L (ref 136–145)
Total Bilirubin: 1.68 mg/dL — ABNORMAL HIGH (ref 0.20–1.20)
Total Protein: 6.8 g/dL (ref 6.4–8.3)

## 2016-05-24 LAB — TECHNOLOGIST REVIEW

## 2016-05-24 LAB — GLUCOSE, CAPILLARY: Glucose-Capillary: 107 mg/dL — ABNORMAL HIGH (ref 65–99)

## 2016-05-24 LAB — LACTATE DEHYDROGENASE: LDH: 180 U/L (ref 125–245)

## 2016-05-24 MED ORDER — ALLOPURINOL 300 MG PO TABS: 300.0000 mg | ORAL_TABLET | Freq: Every day | ORAL | 0 refills | Status: DC

## 2016-05-24 MED ORDER — ONDANSETRON HCL 8 MG PO TABS: 8.0000 mg | ORAL_TABLET | Freq: Three times a day (TID) | ORAL | 3 refills | Status: DC | PRN

## 2016-05-24 MED ORDER — FLUDEOXYGLUCOSE F - 18 (FDG) INJECTION
7.8700 | Freq: Once | INTRAVENOUS | Status: AC | PRN
  Administered 2016-05-24: 7.87 via INTRAVENOUS

## 2016-05-24 MED ORDER — ACYCLOVIR 400 MG PO TABS: 400.0000 mg | ORAL_TABLET | Freq: Every day | ORAL | 5 refills | Status: DC

## 2016-05-24 NOTE — Telephone Encounter (Signed)
Scheduled appts and gave Patient AVS  And calendar for appts per 05/24/2016 los.

## 2016-05-24 NOTE — Progress Notes (Signed)
START OFF PATHWAY REGIMEN - Lymphoma and CLL   [id number]:Obinutuzumab + Chlorambucil q28 Days:   A cycle is every 28 days:     Obinutuzumab        Dose Mod: None     Obinutuzumab        Dose Mod: None     Obinutuzumab        Dose Mod: None     Obinutuzumab        Dose Mod: None     Chlorambucil        Dose Mod: None  **Always confirm dose/schedule in your pharmacy ordering system**    Patient Characteristics: Small Lymphocytic Leukemia (SLL), First Line, Treatment Indicated, 17p del (-), Fit Patient, Age >= 86 Disease Type: Small Lymphocytic Leukemia (SLL) Disease Type: Not Applicable [place] Stage: IV Line of therapy: First Line Treatment Indicated? Treatment Indicated* 17p Deletion Status: Negative Fit or Frail* Patient? Fit Patient Patient Age: Age >= 65  Intent of Therapy: Non-Curative / Palliative Intent, Discussed with Patient

## 2016-05-24 NOTE — Assessment & Plan Note (Signed)
The patient is aware he has incurable disease and treatment is strictly palliative. 

## 2016-05-24 NOTE — Assessment & Plan Note (Signed)
I reviewed the imaging study with the patient and family members. Previous FISH analysis confirmed deletion 13 q.  PET CT scan is consistent with CLL without signs of malignant transformation to lymphoma. I do not feel biopsy is necessary. I do not feel port placement is necessary. Given his age and other comorbidities, I will not prescribe chemotherapy. I shared with them the current guidelines in terms of treatment options.  The decision was made based on publication at the [facility]. It is a category 1 recommendation from [employer]CCN. Nelson Chimes, M.D., Johna Sheriff, M.D., Rubbie Battiest, M.S., Syliva Overman, M.D., Buckner Malta, M.D., Lana Fish. Roderick Pee, M.D., Bradd Burner, M.D., John Giovanni, M.D., Cecil Cobbs, M.D., Lina Sar, M.D., Annie Main Opat, M.D., Derenda Mis. Roxy Manns, M.D., Kathyrn Drown, M.D., Boyce Medici, M.D., Lequita Asal, M.D., Viona Gilmore, M.D., Beryle Beams, Ph.D., Ree Kida, M.D., Sheralyn Boatman, M.D., Tyrone Apple, M.., Katherine Roan, B.., Sharen Heck, M.D., and Bonney Leitz, M.D. Alison Stalling J Med 2014; [id number] 20, 2014DOI: 10.1056/[id number]  The chemotherapy consists of obinutuzumab & chlorambucil. We discussed the role of chemotherapy. The intent is for palliative.   1. Chlorambucil was administered orally at a dose of 0.5 mg per kilogram of body weight on days 1 and 15 of each cycle  2. Obinutuzumab was administered intravenously at a dose of 1000 mg on days 1, 8, and 15 of cycle 1 and on day 1 of cycles 2 through 6  3. Prophylaxis for infusion-related reactions and the tumor lysis syndrome included fluid intake and premedication with allopurinol, acetaminophen, antihistamines, and glucocorticoids. 4. Antimicrobial prophylaxis with acyclovir   The combination chemotherapy yield an average progression free survival approximately 26.7 months and response rates approximately 21%.  Some of the short term  side-effects included, though not limited to, risk of fatigue, weight loss, tumor lysis syndrome, risk of allergic reactions, pancytopenia, life-threatening infections, need for transfusions of blood products, nausea, vomiting, change in bowel habits, risk of congestive heart failure, admission to hospital for various reasons, and risks of death.   Long term side-effects are also discussed including permanent damage to nerve function, chronic fatigue, and rare secondary malignancy including bone marrow disorders.   The patient is aware that the response rates discussed earlier is not guaranteed.    After a long discussion, patient made an informed decision to proceed with the prescribed plan of care.  Due to his age, I will not prescribe chlorambucil. I will start him on prednisone daily at 20 mg until the stop date of treatment.  He will start allopurinol for tumor lysis prophylaxis. I will start him on acyclovir for viral prophylaxis.  I plan to see him on second week of treatment for toxicity review.

## 2016-05-24 NOTE — Assessment & Plan Note (Signed)
He is not symptomatic from anemia or thrombocytopenia. Observe only for now The thrombocytopenia fluctuated back to normal today.  This is likely because of presence of splenomegaly.

## 2016-05-24 NOTE — Progress Notes (Signed)
[place] OFFICE PROGRESS NOTE  Patient Care Team: Lajean Manes, MD as PCP - General (Internal Medicine)  SUMMARY OF ONCOLOGIC HISTORY:   CLL (chronic lymphocytic leukemia) ([place])   10/28/2015 Pathology Results    Flow cytometry confirmed CLL      10/28/2015 Tumor Marker    FISH panel confirmed del 13 q      05/24/2016 PET scan    There is a mildly prominent AP window lymph node and numerous small axillary lymph nodes, but these are not hypermetabolic beyond the mediastinal blood pool activity. 2. Mild splenomegaly, without abnormal splenic activity. 3. Other imaging findings of potential clinical significance: Chronic left frontal and right maxillary sinusitis. Coronary, aortic arch, and branch vessel atherosclerotic vascular disease. Aortoiliac atherosclerotic vascular disease. Mildly prominent prostate gland. Right scrotal hydrocele. Spondylosis, with postoperative findings in the lumbar spine. Degenerative arthropathy of both hips.       INTERVAL HISTORY: Please see below for problem oriented charting. He returns with family members to review imaging study and test results. He has no new symptoms since I saw him.  REVIEW OF SYSTEMS:   Constitutional: Denies fevers, chills or abnormal weight loss Eyes: Denies blurriness of vision Ears, nose, mouth, throat, and face: Denies mucositis or sore throat Respiratory: Denies cough, dyspnea or wheezes Cardiovascular: Denies palpitation, chest discomfort or lower extremity swelling Gastrointestinal:  Denies nausea, heartburn or change in bowel habits Skin: Denies abnormal skin rashes Lymphatics: Denies new lymphadenopathy or easy bruising Neurological:Denies numbness, tingling or new weaknesses Behavioral/Psych: Mood is stable, no new changes  All other systems were reviewed with the patient and are negative.  I have reviewed the past medical history, past surgical history, social history and family history with the  patient and they are unchanged from previous note.  ALLERGIES:  is allergic to sulfa antibiotics.  MEDICATIONS:  Current Outpatient Prescriptions  Medication Sig Dispense Refill  . acyclovir (ZOVIRAX) 400 MG tablet Take 1 tablet (400 mg total) by mouth daily. 30 tablet 5  . allopurinol (ZYLOPRIM) 300 MG tablet Take 1 tablet (300 mg total) by mouth daily. 30 tablet 0  . aspirin 81 MG tablet Take 81 mg by mouth daily.    Marland Kitchen escitalopram (LEXAPRO) 20 MG tablet Take 20 mg by mouth daily.    . finasteride (PROSCAR) 5 MG tablet Take 5 mg by mouth daily.    Marland Kitchen LORazepam (ATIVAN) 0.5 MG tablet     . Multiple Vitamins-Minerals (MULTIVITAMIN WITH MINERALS) tablet Take 1 tablet by mouth daily.    . ondansetron (ZOFRAN) 8 MG tablet Take 1 tablet (8 mg total) by mouth every 8 (eight) hours as needed for nausea. 30 tablet 3  . polyethylene glycol (MIRALAX / GLYCOLAX) packet Take 17 g by mouth 2 (two) times daily.    . temazepam (RESTORIL) 30 MG capsule Take 30 mg by mouth at bedtime as needed for sleep.    Marland Kitchen VITAMIN B1-B12 IM Inject into the muscle every 30 (thirty) days.     No current facility-administered medications for this visit.     PHYSICAL EXAMINATION: ECOG PERFORMANCE STATUS: 0 - Asymptomatic  Vitals:   05/24/16 1235  BP: 123/66  Pulse: 88  Resp: 18  Temp: 98.5 F (36.9 C)   Filed Weights   05/24/16 1235  Weight: 158 lb 4.8 oz (71.8 kg)    GENERAL:alert, no distress and comfortable SKIN: skin color, texture, turgor are normal, no rashes or significant lesions EYES: normal, Conjunctiva are pink and non-injected,  sclera clear OROPHARYNX:no exudate, no erythema and lips, buccal mucosa, and tongue normal  NECK: supple, thyroid normal size, non-tender, without nodularity LYMPH:  no palpable lymphadenopathy in the cervical, axillary or inguinal LUNGS: clear to auscultation and percussion with normal breathing effort HEART: regular rate & rhythm and no murmurs and no lower extremity  edema ABDOMEN:abdomen soft, non-tender and normal bowel sounds Musculoskeletal:no cyanosis of digits and no clubbing  NEURO: alert & oriented x 3 with fluent speech, no focal motor/sensory deficits  LABORATORY DATA:  I have reviewed the data as listed    Component Value Date/Time   NA 138 05/24/2016 1208   K 4.1 05/24/2016 1208   CL 99 04/06/2010 1045   CO2 28 05/24/2016 1208   GLUCOSE 104 05/24/2016 1208   BUN 21.5 05/24/2016 1208   CREATININE 1.2 05/24/2016 1208   CALCIUM 9.0 05/24/2016 1208   PROT 6.8 05/24/2016 1208   ALBUMIN 4.1 05/24/2016 1208   AST 35 (H) 05/24/2016 1208   ALT 20 05/24/2016 1208   ALKPHOS 99 05/24/2016 1208   BILITOT 1.68 (H) 05/24/2016 1208   GFRNONAA >60 04/06/2010 1045   GFRAA  04/06/2010 1045    >60        The eGFR has been calculated using the MDRD equation. This calculation has not been validated in all clinical situations. eGFR's persistently <60 mL/min signify possible Chronic Kidney Disease.    No results found for: SPEP, UPEP  Lab Results  Component Value Date   WBC 99.1 (HH) 05/24/2016   NEUTROABS 3.0 05/24/2016   HGB 11.8 (L) 05/24/2016   HCT 36.3 (L) 05/24/2016   MCV 95.8 05/24/2016   PLT 159 05/24/2016      Chemistry      Component Value Date/Time   NA 138 05/24/2016 1208   K 4.1 05/24/2016 1208   CL 99 04/06/2010 1045   CO2 28 05/24/2016 1208   BUN 21.5 05/24/2016 1208   CREATININE 1.2 05/24/2016 1208      Component Value Date/Time   CALCIUM 9.0 05/24/2016 1208   ALKPHOS 99 05/24/2016 1208   AST 35 (H) 05/24/2016 1208   ALT 20 05/24/2016 1208   BILITOT 1.68 (H) 05/24/2016 1208       RADIOGRAPHIC STUDIES: I have personally reviewed the radiological images as listed and agreed with the findings in the report. 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 EXAM: NUCLEAR MEDICINE PET SKULL BASE TO THIGH TECHNIQUE:  7.9 mCi F-18 FDG was injected intravenously. Full-ring PET imaging was performed from the skull base to thigh after the radiotracer. CT data was obtained and used for attenuation correction and anatomic localization. FASTING BLOOD GLUCOSE:  Value: 107 mg/dl COMPARISON:  Cervical and thoracic MRI dated 05/11/2013 FINDINGS: NECK No hypermetabolic lymph nodes in the neck. Polypoid mucoperiosteal thickening in the left frontal sinus. Chronic right maxillary sinusitis. Mild bilateral carotid bulb atherosclerotic calcification. Torus palatinus. CHEST There are numerous small axillary lymph nodes, particularly on the right side, most of which have fatty hila. The right axillary lymph nodes have maximum SUV of 2.2. Background mediastinal blood pool activity at 2.4. AP window lymph node 1.3 cm in short axis, maximum SUV 2.4 Coronary, aortic arch, and branch vessel atherosclerotic vascular disease. Biapical pleuroparenchymal scarring. Mild dependent subsegmental atelectasis in both lungs. No worrisome nodules identified. ABDOMEN/PELVIS No abnormal hypermetabolic activity within the liver, pancreas, adrenal glands, or spleen. No hypermetabolic lymph  nodes in the abdomen or pelvis. Physiologic activity in bowel and in the ureters. Aortoiliac atherosclerotic vascular disease. Mildly prominent prostate gland indents the bladder base. Right scrotal hydrocele. Background liver activity SUV 2.9. Mild splenomegaly, splenic volume 580 cubic cm, no abnormal splenic activity currently. SKELETON No focal hypermetabolic activity to suggest skeletal metastasis. Spondylosis noted. Degenerative right sternoclavicular arthropathy. Postoperative findings in the lumbar spine. Lower lumbar degenerative facet arthropathy. Degenerative arthropathy of both hips. IMPRESSION: 1. There is a mildly prominent AP window lymph node and numerous small axillary lymph nodes, but these are not hypermetabolic beyond the mediastinal blood pool activity. 2. Mild  splenomegaly, without abnormal splenic activity. 3. Other imaging findings of potential clinical significance: Chronic left frontal and right maxillary sinusitis. Coronary, aortic arch, and branch vessel atherosclerotic vascular disease. Aortoiliac atherosclerotic vascular disease. Mildly prominent prostate gland. Right scrotal hydrocele. Spondylosis, with postoperative findings in the lumbar spine. Degenerative arthropathy of both hips. Electronically Signed   By: Van Clines M.D.   On: 05/24/2016 10:07    ASSESSMENT & PLAN:  CLL (chronic lymphocytic leukemia) ([place]) I reviewed the imaging study with the patient and family members. Previous FISH analysis confirmed deletion 13 q.  PET CT scan is consistent with CLL without signs of malignant transformation to lymphoma. I do not feel biopsy is necessary. I do not feel port placement is necessary. Given his age and other comorbidities, I will not prescribe chemotherapy. I shared with them the current guidelines in terms of treatment options.  The decision was made based on publication at the [facility]. It is a category 1 recommendation from [employer]CCN. Nelson Chimes, M.D., Johna Sheriff, M.D., Rubbie Battiest, M.S., Syliva Overman, M.D., Buckner Malta, M.D., Lana Fish. Roderick Pee, M.D., Bradd Burner, M.D., John Giovanni, M.D., Cecil Cobbs, M.D., Lina Sar, M.D., Annie Main Opat, M.D., Derenda Mis. Roxy Manns, M.D., Kathyrn Drown, M.D., Boyce Medici, M.D., Lequita Asal, M.D., Viona Gilmore, M.D., Beryle Beams, Ph.D., Ree Kida, M.D., Sheralyn Boatman, M.D., Tyrone Apple, M.Calpella., Katherine Roan, B.., Sharen Heck, M.D., and Bonney Leitz, M.D. Alison Stalling J Med 2014; [id number] 20, 2014DOI: 10.1056/[id number]  The chemotherapy consists of obinutuzumab & chlorambucil. We discussed the role of chemotherapy. The intent is for palliative.   1. Chlorambucil was administered orally at a dose of 0.5 mg per  kilogram of body weight on days 1 and 15 of each cycle  2. Obinutuzumab was administered intravenously at a dose of 1000 mg on days 1, 8, and 15 of cycle 1 and on day 1 of cycles 2 through 6  3. Prophylaxis for infusion-related reactions and the tumor lysis syndrome included fluid intake and premedication with allopurinol, acetaminophen, antihistamines, and glucocorticoids. 4. Antimicrobial prophylaxis with acyclovir   The combination chemotherapy yield an average progression free survival approximately 26.7 months and response rates approximately 21%.  Some of the short term side-effects included, though not limited to, risk of fatigue, weight loss, tumor lysis syndrome, risk of allergic reactions, pancytopenia, life-threatening infections, need for transfusions of blood products, nausea, vomiting, change in bowel habits, risk of congestive heart failure, admission to hospital for various reasons, and risks of death.   Long term side-effects are also discussed including permanent damage to nerve function, chronic fatigue, and rare secondary malignancy including bone marrow disorders.   The patient is aware that the response rates discussed earlier is not guaranteed.    After a long discussion, patient made an informed decision to proceed with the prescribed plan of care.  Due to his  age, I will not prescribe chlorambucil. I will start him on prednisone daily at 20 mg until the stop date of treatment.  He will start allopurinol for tumor lysis prophylaxis. I will start him on acyclovir for viral prophylaxis.  I plan to see him on second week of treatment for toxicity review.   Pancytopenia, acquired ([place]) He is not symptomatic from anemia or thrombocytopenia. Observe only for now The thrombocytopenia fluctuated back to normal today.  This is likely because of presence of splenomegaly.  Goals of care, counseling/discussion The patient is aware he has incurable disease and treatment is strictly  palliative.    No orders of the defined types were placed in this encounter.  All questions were answered. The patient knows to call the clinic with any problems, questions or concerns. No barriers to learning was detected. I spent 40 minutes counseling the patient face to face. The total time spent in the appointment was 50 minutes and more than 50% was on counseling and review of test results     [name], MD 05/24/2016 4:29 PM

## 2016-05-25 ENCOUNTER — Other Ambulatory Visit: Payer: Self-pay | Admitting: Hematology and Oncology

## 2016-05-25 ENCOUNTER — Telehealth: Payer: Self-pay

## 2016-05-25 DIAGNOSIS — C9110 Chronic lymphocytic leukemia of B-cell type not having achieved remission: Secondary | ICD-10-CM

## 2016-05-25 LAB — HEPATITIS B SURFACE ANTIBODY,QUALITATIVE: HEP B SURFACE AB, QUAL: NONREACTIVE

## 2016-05-25 LAB — HEPATITIS B SURFACE ANTIGEN: HBsAg Screen: NEGATIVE

## 2016-05-25 LAB — HEPATITIS B CORE ANTIBODY, IGM: Hep B Core Ab, IgM: NEGATIVE

## 2016-05-25 MED ORDER — PREDNISONE 20 MG PO TABS: 20.0000 mg | ORAL_TABLET | Freq: Every day | ORAL | 0 refills | Status: DC

## 2016-05-25 NOTE — Telephone Encounter (Signed)
Called patient and informed that prednisone Rx was e-scribed to pharmacy.

## 2016-05-26 DIAGNOSIS — H6123 Impacted cerumen, bilateral: Secondary | ICD-10-CM | POA: Diagnosis not present

## 2016-05-26 DIAGNOSIS — I10 Essential (primary) hypertension: Secondary | ICD-10-CM | POA: Diagnosis not present

## 2016-05-30 ENCOUNTER — Encounter: Payer: Self-pay | Admitting: Hematology and Oncology

## 2016-06-02 ENCOUNTER — Other Ambulatory Visit: Payer: Self-pay | Admitting: Hematology and Oncology

## 2016-06-02 DIAGNOSIS — C9110 Chronic lymphocytic leukemia of B-cell type not having achieved remission: Secondary | ICD-10-CM

## 2016-06-05 ENCOUNTER — Other Ambulatory Visit (HOSPITAL_BASED_OUTPATIENT_CLINIC_OR_DEPARTMENT_OTHER): Payer: Medicare Other

## 2016-06-05 ENCOUNTER — Encounter: Payer: Self-pay | Admitting: Nurse Practitioner

## 2016-06-05 ENCOUNTER — Ambulatory Visit (HOSPITAL_BASED_OUTPATIENT_CLINIC_OR_DEPARTMENT_OTHER): Payer: Medicare Other

## 2016-06-05 ENCOUNTER — Ambulatory Visit (HOSPITAL_BASED_OUTPATIENT_CLINIC_OR_DEPARTMENT_OTHER): Payer: Medicare Other | Admitting: Nurse Practitioner

## 2016-06-05 ENCOUNTER — Other Ambulatory Visit: Payer: Self-pay | Admitting: Hematology and Oncology

## 2016-06-05 VITALS — BP 105/53 | HR 82 | Temp 98.6°F | Resp 18

## 2016-06-05 DIAGNOSIS — F411 Generalized anxiety disorder: Secondary | ICD-10-CM

## 2016-06-05 DIAGNOSIS — Z5112 Encounter for antineoplastic immunotherapy: Secondary | ICD-10-CM

## 2016-06-05 DIAGNOSIS — C9110 Chronic lymphocytic leukemia of B-cell type not having achieved remission: Secondary | ICD-10-CM

## 2016-06-05 DIAGNOSIS — T7840XA Allergy, unspecified, initial encounter: Secondary | ICD-10-CM | POA: Insufficient documentation

## 2016-06-05 DIAGNOSIS — C8300 Small cell B-cell lymphoma, unspecified site: Secondary | ICD-10-CM

## 2016-06-05 LAB — CBC WITH DIFFERENTIAL/PLATELET
BASO%: 0.5 % (ref 0.0–2.0)
Basophils Absolute: 0.7 10*3/uL — ABNORMAL HIGH (ref 0.0–0.1)
EOS ABS: 0.2 10*3/uL (ref 0.0–0.5)
EOS%: 0.1 % (ref 0.0–7.0)
HCT: 34.3 % — ABNORMAL LOW (ref 38.4–49.9)
HEMOGLOBIN: 11.7 g/dL — AB (ref 13.0–17.1)
LYMPH%: 90.1 % — AB (ref 14.0–49.0)
MCH: 31.5 pg (ref 27.2–33.4)
MCHC: 34 g/dL (ref 32.0–36.0)
MCV: 92.8 fL (ref 79.3–98.0)
MONO#: 1.7 10*3/uL — ABNORMAL HIGH (ref 0.1–0.9)
MONO%: 1.1 % (ref 0.0–14.0)
NEUT%: 8.2 % — ABNORMAL LOW (ref 39.0–75.0)
NEUTROS ABS: 12.1 10*3/uL — AB (ref 1.5–6.5)
Platelets: 196 10*3/uL (ref 140–400)
RBC: 3.7 10*6/uL — AB (ref 4.20–5.82)
RDW: 14.5 % (ref 11.0–14.6)
WBC: 148.3 10*3/uL — AB (ref 4.0–10.3)
lymph#: 133.6 10*3/uL — ABNORMAL HIGH (ref 0.9–3.3)

## 2016-06-05 LAB — COMPREHENSIVE METABOLIC PANEL
ALK PHOS: 94 U/L (ref 40–150)
ALT: 19 U/L (ref 0–55)
ANION GAP: 8 meq/L (ref 3–11)
AST: 25 U/L (ref 5–34)
Albumin: 4.1 g/dL (ref 3.5–5.0)
BUN: 22.8 mg/dL (ref 7.0–26.0)
CALCIUM: 8.6 mg/dL (ref 8.4–10.4)
CHLORIDE: 103 meq/L (ref 98–109)
CO2: 25 mEq/L (ref 22–29)
CREATININE: 1.1 mg/dL (ref 0.7–1.3)
EGFR: 58 mL/min/{1.73_m2} — ABNORMAL LOW (ref >90)
Glucose: 134 mg/dl (ref 70–140)
Potassium: 4.3 mEq/L (ref 3.5–5.1)
Sodium: 136 mEq/L (ref 136–145)
Total Bilirubin: 1.57 mg/dL — ABNORMAL HIGH (ref 0.20–1.20)
Total Protein: 6.8 g/dL (ref 6.4–8.3)

## 2016-06-05 LAB — LACTATE DEHYDROGENASE: LDH: 161 U/L (ref 125–245)

## 2016-06-05 LAB — TECHNOLOGIST REVIEW

## 2016-06-05 LAB — URIC ACID: URIC ACID, SERUM: 2.1 mg/dL — AB (ref 2.6–7.4)

## 2016-06-05 MED ORDER — DEXAMETHASONE SODIUM PHOSPHATE 100 MG/10ML IJ SOLN
20.0000 mg | Freq: Once | INTRAMUSCULAR | Status: AC
  Administered 2016-06-05: 20 mg via INTRAVENOUS
  Filled 2016-06-05: qty 2

## 2016-06-05 MED ORDER — DIPHENHYDRAMINE HCL 50 MG/ML IJ SOLN
50.0000 mg | Freq: Once | INTRAMUSCULAR | Status: AC
  Administered 2016-06-05: 50 mg via INTRAVENOUS

## 2016-06-05 MED ORDER — METHYLPREDNISOLONE SODIUM SUCC 125 MG IJ SOLR
125.0000 mg | Freq: Once | INTRAMUSCULAR | Status: AC | PRN
  Administered 2016-06-05: 60 mg via INTRAVENOUS

## 2016-06-05 MED ORDER — FAMOTIDINE IN NACL 20-0.9 MG/50ML-% IV SOLN
20.0000 mg | Freq: Once | INTRAVENOUS | Status: AC | PRN
  Administered 2016-06-05: 20 mg via INTRAVENOUS

## 2016-06-05 MED ORDER — ACETAMINOPHEN 325 MG PO TABS
650.0000 mg | ORAL_TABLET | Freq: Once | ORAL | Status: AC
  Administered 2016-06-05: 650 mg via ORAL

## 2016-06-05 MED ORDER — ACETAMINOPHEN 325 MG PO TABS
ORAL_TABLET | ORAL | Status: AC
  Filled 2016-06-05: qty 2

## 2016-06-05 MED ORDER — DIPHENHYDRAMINE HCL 50 MG/ML IJ SOLN
INTRAMUSCULAR | Status: AC
  Filled 2016-06-05: qty 1

## 2016-06-05 MED ORDER — SODIUM CHLORIDE 0.9 % IV SOLN
Freq: Once | INTRAVENOUS | Status: AC
  Administered 2016-06-05: 12:00:00 via INTRAVENOUS

## 2016-06-05 MED ORDER — SODIUM CHLORIDE 0.9 % IV SOLN
100.0000 mg | Freq: Once | INTRAVENOUS | Status: AC
  Administered 2016-06-05: 100 mg via INTRAVENOUS
  Filled 2016-06-05: qty 4

## 2016-06-05 NOTE — Assessment & Plan Note (Signed)
Patient presented to the [facility] today to receive his first cycle of Gazyva infusions.  Patient was premedicated with Decadron 20 mg and Benadryl 50 mg.  During the infusion.  He complained of feeling flushed and increasingly anxious.  Patient notes that he has a history of panic attacks and chronic anxiety as well.  He is unclear if his symptoms are an actual hypersensitivity reaction versus anxiety.  Infusion was held; and patient was given Pepcid IV, and Solu-Medrol 60 mg IV as well.  All symptoms resolved and patient was able to complete his infusion as planned.

## 2016-06-05 NOTE — Patient Instructions (Signed)
[place] Discharge Instructions for Patients Receiving Chemotherapy  Today you received the following chemotherapy agents Gazyva (obinutuzumab)  To help prevent nausea and vomiting after your treatment, we encourage you to take your nausea medication as prescribed.  If you develop nausea and vomiting that is not controlled by your nausea medication, call the clinic.   BELOW ARE SYMPTOMS THAT SHOULD BE REPORTED IMMEDIATELY:  *FEVER GREATER THAN 100.5 F  *CHILLS WITH OR WITHOUT FEVER  NAUSEA AND VOMITING THAT IS NOT CONTROLLED WITH YOUR NAUSEA MEDICATION  *UNUSUAL SHORTNESS OF BREATH  *UNUSUAL BRUISING OR BLEEDING  TENDERNESS IN MOUTH AND THROAT WITH OR WITHOUT PRESENCE OF ULCERS  *URINARY PROBLEMS  *BOWEL PROBLEMS  UNUSUAL RASH Items with * indicate a potential emergency and should be followed up as soon as possible.  Feel free to call the clinic you have any questions or concerns. The clinic phone number is [phone].  Please show the [place] at check-in to the Emergency Department and triage nurse.  Obinutuzumab injection What is this medicine? OBINUTUZUMAB (OH bi nue TOOZ ue mab) is a monoclonal antibody. It is used to treat chronic lymphocytic leukemia (CLL) and a type of non-Hodgkin lymphoma (NHL), follicular lymphoma. This medicine may be used for other purposes; ask your health care provider or pharmacist if you have questions. COMMON BRAND NAME(S): GAZYVA What should I tell my health care provider before I take this medicine? They need to know if you have any of these conditions: -infection (especially a virus infection such as hepatitis B virus) -lung or breathing disease -heart disease -take medicines that treat or prevent blood clots -an unusual or allergic reaction to obinutuzumab, other medicines, foods, dyes, or preservatives -pregnant or trying to get pregnant -breast-feeding How should I use this medicine? This medicine is  for infusion into a vein. It is given by a health care professional in a hospital or clinic setting. Talk to your pediatrician regarding the use of this medicine in children. Special care may be needed. Overdosage: If you think you have taken too much of this medicine contact a poison control center or emergency room at once. NOTE: This medicine is only for you. Do not share this medicine with others. What if I miss a dose? Keep appointments for follow-up doses as directed. It is important not to miss your dose. Call your doctor or health care professional if you are unable to keep an appointment. What may interact with this medicine? -live virus vaccines This list may not describe all possible interactions. Give your health care provider a list of all the medicines, herbs, non-prescription drugs, or dietary supplements you use. Also tell them if you smoke, drink alcohol, or use illegal drugs. Some items may interact with your medicine. What should I watch for while using this medicine? Report any side effects that you notice during your treatment right away, such as changes in your breathing, fever, chills, dizziness or lightheadedness. These effects are more common with the first dose. Visit your prescriber or health care professional for checks on your progress. You will need to have regular blood work. Report any other side effects. The side effects of this medicine can continue after you finish your treatment. Continue your course of treatment even though you feel ill unless your doctor tells you to stop. Call your doctor or health care professional for advice if you get a fever, chills or sore throat, or other symptoms of a cold or flu. Do not treat  yourself. This drug decreases your body's ability to fight infections. Try to avoid being around people who are sick. This medicine may increase your risk to bruise or bleed. Call your doctor or health care professional if you notice any unusual  bleeding. What side effects may I notice from receiving this medicine? Side effects that you should report to your doctor or health care professional as soon as possible: -allergic reactions like skin rash, itching or hives, swelling of the face, lips, or tongue -breathing problems -changes in vision -chest pain or chest tightness -confusion -dizziness -loss of balance or coordination -low blood counts - this medicine may decrease the number of white blood cells, red blood cells and platelets. You may be at increased risk for infections and bleeding. -signs of decreased platelets or bleeding - bruising, pinpoint red spots on the skin, black, tarry stools, blood in the urine -signs of infection - fever or chills, cough, sore throat, pain or trouble passing urine -signs and symptoms of liver injury like dark yellow or brown urine; general ill feeling or flu-like symptoms; light-colored stools; loss of appetite; nausea; right upper belly pain; unusually weak or tired; yellowing of the eyes or skin -trouble speaking or understanding -trouble walking -vomiting Side effects that usually do not require medical attention (report to your doctor or health care professional if they continue or are bothersome): -constipation -joint pain -muscle pain This list may not describe all possible side effects. Call your doctor for medical advice about side effects. You may report side effects to FDA at 1-800-FDA-1088. Where should I keep my medicine? This drug is only given in a hospital or clinic and will not be stored at home. NOTE: This sheet is a summary. It may not cover all possible information. If you have questions about this medicine, talk to your doctor, pharmacist, or health care provider.  2018 Elsevier/Gold Standard (2015-04-15 08:54:03)

## 2016-06-05 NOTE — Progress Notes (Signed)
Patient tolerated remainder of infusion well. Patient and vital signs stable upon discharge.

## 2016-06-05 NOTE — Assessment & Plan Note (Signed)
Patient presented to the [facility] today to receive his first cycle of Gazyva infusions.  See further notes for details of today's visit.  Patient is scheduled to return tomorrow for cycle 1, day 2 of his infusion.  He is also scheduled for labs, visit, his next cycle of infusions on 06/12/2016.

## 2016-06-05 NOTE — Progress Notes (Signed)
Per Dr Alvy Bimler ok to treat with todays labs.  1st  Time Gazyva infusion started @1332 . At 1350 patient's face was flushed and complained of being hot and sweaty. Gazyva infusion stopped and NS was started at 999. VS taken and documented,  VS stable. Selena Lesser NP was notified and initiated hypersensitivity protocol in treatment area. Medications given as stated on MAR. @1415  patient face was no longer flushed, patient stated he was no longer sweaty or hot, and that he has had panic attacks in the past. Patient stated he thinks "it may have just been a panic attack". Patient stated he will take home medication temazepam prior to next infusion, to prevent another attack.  @ [place] NP notified, ordered to re-start infusion @ 26 mL/hour. Patient VS stable. No further symptoms. Will continue to monitor and report any changes to Selena Lesser NP.

## 2016-06-05 NOTE — Progress Notes (Signed)
SYMPTOM MANAGEMENT CLINIC    Chief Complaint: Hypersensitivity reaction  HPI:  Christopher Fowler. 83 y.o. male diagnosed with chronic leukocytic leukemia.  Currently undergoing Gazyva infusions.      CLL (chronic lymphocytic leukemia) ([place])   10/28/2015 Pathology Results    Flow cytometry confirmed CLL      10/28/2015 Tumor Marker    FISH panel confirmed del 13 q      05/24/2016 PET scan    There is a mildly prominent AP window lymph node and numerous small axillary lymph nodes, but these are not hypermetabolic beyond the mediastinal blood pool activity. 2. Mild splenomegaly, without abnormal splenic activity. 3. Other imaging findings of potential clinical significance: Chronic left frontal and right maxillary sinusitis. Coronary, aortic arch, and branch vessel atherosclerotic vascular disease. Aortoiliac atherosclerotic vascular disease. Mildly prominent prostate gland. Right scrotal hydrocele. Spondylosis, with postoperative findings in the lumbar spine. Degenerative arthropathy of both hips.       Review of Systems  Skin:       Complaint of feeling flushed  Psychiatric/Behavioral:       Complaint of feeling mildly anxious.  All other systems reviewed and are negative.   Past Medical History:  Diagnosis Date  . BPH (benign prostatic hyperplasia)   . Degenerative joint disease (DJD) of hip    hip  . DJD (degenerative joint disease) of cervical spine   . DJD (degenerative joint disease) of knee    left  . Elevated MCV   . Gilbert's syndrome   . Hernia, inguinal    left  . Neurogenic bladder   . Pancreatic cyst    2cm negative needle aspiration  . Spermatocele    right  . 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 BACK SURGERY     L4 herniated disk    has CLL (chronic lymphocytic leukemia) ([place]); Gilbert's syndrome; Anemia due to chronic illness; Lymphoma, small lymphocytic ([place]); Pancytopenia, acquired ([place]);  Goals of care, counseling/discussion; and Hypersensitivity reaction on his problem list.    is allergic to sulfa antibiotics.  Allergies as of 06/05/2016      Reactions   Sulfa Antibiotics Hives      Medication List       Accurate as of 06/05/16  4:15 PM. Always use your most recent med list.          acyclovir 400 MG tablet Commonly known as:  ZOVIRAX Take 1 tablet (400 mg total) by mouth daily.   allopurinol 300 MG tablet Commonly known as:  ZYLOPRIM Take 1 tablet (300 mg total) by mouth daily.   aspirin 81 MG tablet Take 81 mg by mouth daily.   escitalopram 20 MG tablet Commonly known as:  LEXAPRO Take 20 mg by mouth daily.   finasteride 5 MG tablet Commonly known as:  PROSCAR Take 5 mg by mouth daily.   LORazepam 0.5 MG tablet Commonly known as:  ATIVAN   multivitamin with minerals tablet Take 1 tablet by mouth daily.   ondansetron 8 MG tablet Commonly known as:  ZOFRAN Take 1 tablet (8 mg total) by mouth every 8 (eight) hours as needed for nausea.   polyethylene glycol packet Commonly known as:  MIRALAX / GLYCOLAX Take 17 g by mouth 2 (two) times daily.   predniSONE 20 MG tablet Commonly known as:  DELTASONE Take 1 tablet (20 mg total) by mouth daily with breakfast.   temazepam 30 MG capsule Commonly  known as:  RESTORIL Take 30 mg by mouth at bedtime as needed for sleep.   VITAMIN B1-B12 IM Inject into the muscle every 30 (thirty) days.        PHYSICAL EXAMINATION  Oncology Vitals 06/05/2016 06/05/2016  Height - -  Weight - -  Weight (lbs) - -  BMI (kg/m2) - -  Temp 98.5 98.5  Pulse 84 73  Resp 18 18  SpO2 100 98  BSA (m2) - -   BP Readings from Last 2 Encounters:  06/05/16 (!) 112/59  05/24/16 123/66    Physical Exam  Constitutional: He is oriented to person, place, and time and well-developed, well-nourished, and in no distress.  HENT:  Head: Normocephalic and atraumatic.  Eyes: Conjunctivae and EOM are normal. Pupils are equal,  round, and reactive to light.  Neck: Normal range of motion.  Pulmonary/Chest: Effort normal. No stridor. No respiratory distress.  Musculoskeletal: Normal range of motion.  Neurological: He is alert and oriented to person, place, and time.  Psychiatric: Affect normal.  Nursing note and vitals reviewed.   LABORATORY DATA:. Appointment on 06/05/2016  Component Date Value Ref Range Status  . Sodium 06/05/2016 136  136 - 145 mEq/L Final  . Potassium 06/05/2016 4.3  3.5 - 5.1 mEq/L Final  . Chloride 06/05/2016 103  98 - 109 mEq/L Final  . CO2 06/05/2016 25  22 - 29 mEq/L Final  . Glucose 06/05/2016 134  70 - 140 mg/dl Final  . BUN 06/05/2016 22.8  7.0 - 26.0 mg/dL Final  . Creatinine 06/05/2016 1.1  0.7 - 1.3 mg/dL Final  . Total Bilirubin 06/05/2016 1.57* 0.20 - 1.20 mg/dL Final  . Alkaline Phosphatase 06/05/2016 94  40 - 150 U/L Final  . AST 06/05/2016 25  5 - 34 U/L Final  . ALT 06/05/2016 19  0 - 55 U/L Final  . Total Protein 06/05/2016 6.8  6.4 - 8.3 g/dL Final  . Albumin 06/05/2016 4.1  3.5 - 5.0 g/dL Final  . Calcium 06/05/2016 8.6  8.4 - 10.4 mg/dL Final  . Anion Gap 06/05/2016 8  3 - 11 mEq/L Final  . EGFR 06/05/2016 58* >90 ml/min/1.73 m2 Final  . WBC 06/05/2016 148.3* 4.0 - 10.3 10e3/uL Final  . NEUT# 06/05/2016 12.1* 1.5 - 6.5 10e3/uL Final  . HGB 06/05/2016 11.7* 13.0 - 17.1 g/dL Final  . HCT 06/05/2016 34.3* 38.4 - 49.9 % Final  . Platelets 06/05/2016 196  140 - 400 10e3/uL Final  . MCV 06/05/2016 92.8  79.3 - 98.0 fL Final  . MCH 06/05/2016 31.5  27.2 - 33.4 pg Final  . MCHC 06/05/2016 34.0  32.0 - 36.0 g/dL Final  . RBC 06/05/2016 3.70* 4.20 - 5.82 10e6/uL Final  . RDW 06/05/2016 14.5  11.0 - 14.6 % Final  . lymph# 06/05/2016 133.6* 0.9 - 3.3 10e3/uL Final  . MONO# 06/05/2016 1.7* 0.1 - 0.9 10e3/uL Final  . Eosinophils Absolute 06/05/2016 0.2  0.0 - 0.5 10e3/uL Final  . Basophils Absolute 06/05/2016 0.7* 0.0 - 0.1 10e3/uL Final  . NEUT% 06/05/2016 8.2* 39.0 -  75.0 % Final  . LYMPH% 06/05/2016 90.1* 14.0 - 49.0 % Final  . MONO% 06/05/2016 1.1  0.0 - 14.0 % Final  . EOS% 06/05/2016 0.1  0.0 - 7.0 % Final  . BASO% 06/05/2016 0.5  0.0 - 2.0 % Final  . LDH 06/05/2016 161  125 - 245 U/L Final  . Uric Acid, Serum 06/05/2016 2.1* 2.6 - 7.4 mg/dl Final  .  Technologist Review 06/05/2016 Variant lymphs present (some with folded nucleus and some with nucleoli), Few smudge cells   Final    RADIOGRAPHIC STUDIES: No results found.  ASSESSMENT/PLAN:    Hypersensitivity reaction Patient presented to the [facility] today to receive his first cycle of Gazyva infusions.  Patient was premedicated with Decadron 20 mg and Benadryl 50 mg.  During the infusion.  He complained of feeling flushed and increasingly anxious.  Patient notes that he has a history of panic attacks and chronic anxiety as well.  He is unclear if his symptoms are an actual hypersensitivity reaction versus anxiety.  Infusion was held; and patient was given Pepcid IV, and Solu-Medrol 60 mg IV as well.  All symptoms resolved and patient was able to complete his infusion as planned.  CLL (chronic lymphocytic leukemia) ([place]) Patient presented to the [facility] today to receive his first cycle of Gazyva infusions.  See further notes for details of today's visit.  Patient is scheduled to return tomorrow for cycle 1, day 2 of his infusion.  He is also scheduled for labs, visit, his next cycle of infusions on 06/12/2016.   Patient stated understanding of all instructions; and was in agreement with this plan of care. The patient knows to call the clinic with any problems, questions or concerns.   Total time spent with patient was 15 minutes;  with greater than 75 percent of that time spent in face to face counseling regarding patient's symptoms,  and coordination of care and follow up.  Disclaimer:This dictation was prepared with Dragon/digital dictation along with [employer]. Any  transcriptional errors that result from this process are unintentional.  Drue Second, NP 06/05/2016

## 2016-06-06 ENCOUNTER — Ambulatory Visit (HOSPITAL_BASED_OUTPATIENT_CLINIC_OR_DEPARTMENT_OTHER): Payer: Medicare Other

## 2016-06-06 VITALS — BP 114/58 | HR 75 | Temp 97.7°F | Resp 18

## 2016-06-06 DIAGNOSIS — Z5112 Encounter for antineoplastic immunotherapy: Secondary | ICD-10-CM | POA: Diagnosis present

## 2016-06-06 DIAGNOSIS — C9110 Chronic lymphocytic leukemia of B-cell type not having achieved remission: Secondary | ICD-10-CM

## 2016-06-06 DIAGNOSIS — C8300 Small cell B-cell lymphoma, unspecified site: Secondary | ICD-10-CM

## 2016-06-06 MED ORDER — DIPHENHYDRAMINE HCL 50 MG/ML IJ SOLN
50.0000 mg | Freq: Once | INTRAMUSCULAR | Status: AC
  Administered 2016-06-06: 50 mg via INTRAVENOUS

## 2016-06-06 MED ORDER — SODIUM CHLORIDE 0.9 % IV SOLN
20.0000 mg | Freq: Once | INTRAVENOUS | Status: AC
  Administered 2016-06-06: 20 mg via INTRAVENOUS
  Filled 2016-06-06: qty 2

## 2016-06-06 MED ORDER — DIPHENHYDRAMINE HCL 50 MG/ML IJ SOLN
INTRAMUSCULAR | Status: AC
  Filled 2016-06-06: qty 1

## 2016-06-06 MED ORDER — SODIUM CHLORIDE 0.9 % IV SOLN
Freq: Once | INTRAVENOUS | Status: AC
  Administered 2016-06-06: 09:00:00 via INTRAVENOUS

## 2016-06-06 MED ORDER — OBINUTUZUMAB CHEMO INJECTION 1000 MG/40ML
900.0000 mg | Freq: Once | INTRAVENOUS | Status: AC
  Administered 2016-06-06: 900 mg via INTRAVENOUS
  Filled 2016-06-06: qty 36

## 2016-06-06 MED ORDER — ACETAMINOPHEN 325 MG PO TABS
650.0000 mg | ORAL_TABLET | Freq: Once | ORAL | Status: AC
  Administered 2016-06-06: 650 mg via ORAL

## 2016-06-06 MED ORDER — ACETAMINOPHEN 325 MG PO TABS
ORAL_TABLET | ORAL | Status: AC
  Filled 2016-06-06: qty 2

## 2016-06-06 NOTE — Patient Instructions (Addendum)
[facility] Discharge Instructions for Patients Receiving Chemotherapy  Today you received the following chemotherapy agents:  Gazyva  To help prevent nausea and vomiting after your treatment, we encourage you to take your nausea medication as prescribed.   If you develop nausea and vomiting that is not controlled by your nausea medication, call the clinic.   BELOW ARE SYMPTOMS THAT SHOULD BE REPORTED IMMEDIATELY:  *FEVER GREATER THAN 100.5 F  *CHILLS WITH OR WITHOUT FEVER  NAUSEA AND VOMITING THAT IS NOT CONTROLLED WITH YOUR NAUSEA MEDICATION  *UNUSUAL SHORTNESS OF BREATH  *UNUSUAL BRUISING OR BLEEDING  TENDERNESS IN MOUTH AND THROAT WITH OR WITHOUT PRESENCE OF ULCERS  *URINARY PROBLEMS  *BOWEL PROBLEMS  UNUSUAL RASH Items with * indicate a potential emergency and should be followed up as soon as possible.  Feel free to call the clinic you have any questions or concerns. The clinic phone number is [phone].  Please show the [facility] at check-in to the Emergency Department and triage nurse.     Obinutuzumab injection What is this medicine? OBINUTUZUMAB (OH bi nue TOOZ ue mab) is a monoclonal antibody. It is used to treat chronic lymphocytic leukemia (CLL) and a type of non-Hodgkin lymphoma (NHL), follicular lymphoma. This medicine may be used for other purposes; ask your health care provider or pharmacist if you have questions. COMMON BRAND NAME(S): GAZYVA What should I tell my health care provider before I take this medicine? They need to know if you have any of these conditions: -infection (especially a virus infection such as hepatitis B virus) -lung or breathing disease -heart disease -take medicines that treat or prevent blood clots -an unusual or allergic reaction to obinutuzumab, other medicines, foods, dyes, or preservatives -pregnant or trying to get pregnant -breast-feeding How should I use this medicine? This medicine is for  infusion into a vein. It is given by a health care professional in a hospital or clinic setting. Talk to your pediatrician regarding the use of this medicine in children. Special care may be needed. Overdosage: If you think you have taken too much of this medicine contact a poison control center or emergency room at once. NOTE: This medicine is only for you. Do not share this medicine with others. What if I miss a dose? Keep appointments for follow-up doses as directed. It is important not to miss your dose. Call your doctor or health care professional if you are unable to keep an appointment. What may interact with this medicine? -live virus vaccines This list may not describe all possible interactions. Give your health care provider a list of all the medicines, herbs, non-prescription drugs, or dietary supplements you use. Also tell them if you smoke, drink alcohol, or use illegal drugs. Some items may interact with your medicine. What should I watch for while using this medicine? Report any side effects that you notice during your treatment right away, such as changes in your breathing, fever, chills, dizziness or lightheadedness. These effects are more common with the first dose. Visit your prescriber or health care professional for checks on your progress. You will need to have regular blood work. Report any other side effects. The side effects of this medicine can continue after you finish your treatment. Continue your course of treatment even though you feel ill unless your doctor tells you to stop. Call your doctor or health care professional for advice if you get a fever, chills or sore throat, or other symptoms of a cold or  flu. Do not treat yourself. This drug decreases your body's ability to fight infections. Try to avoid being around people who are sick. This medicine may increase your risk to bruise or bleed. Call your doctor or health care professional if you notice any unusual  bleeding. What side effects may I notice from receiving this medicine? Side effects that you should report to your doctor or health care professional as soon as possible: -allergic reactions like skin rash, itching or hives, swelling of the face, lips, or tongue -breathing problems -changes in vision -chest pain or chest tightness -confusion -dizziness -loss of balance or coordination -low blood counts - this medicine may decrease the number of white blood cells, red blood cells and platelets. You may be at increased risk for infections and bleeding. -signs of decreased platelets or bleeding - bruising, pinpoint red spots on the skin, black, tarry stools, blood in the urine -signs of infection - fever or chills, cough, sore throat, pain or trouble passing urine -signs and symptoms of liver injury like dark yellow or brown urine; general ill feeling or flu-like symptoms; light-colored stools; loss of appetite; nausea; right upper belly pain; unusually weak or tired; yellowing of the eyes or skin -trouble speaking or understanding -trouble walking -vomiting Side effects that usually do not require medical attention (report to your doctor or health care professional if they continue or are bothersome): -constipation -joint pain -muscle pain This list may not describe all possible side effects. Call your doctor for medical advice about side effects. You may report side effects to FDA at 1-800-FDA-1088. Where should I keep my medicine? This drug is only given in a hospital or clinic and will not be stored at home. NOTE: This sheet is a summary. It may not cover all possible information. If you have questions about this medicine, talk to your doctor, pharmacist, or health care provider.  2018 Elsevier/Gold Standard (2015-04-15 08:54:03)

## 2016-06-12 ENCOUNTER — Ambulatory Visit (HOSPITAL_BASED_OUTPATIENT_CLINIC_OR_DEPARTMENT_OTHER): Payer: Medicare Other | Admitting: Hematology and Oncology

## 2016-06-12 ENCOUNTER — Ambulatory Visit (HOSPITAL_BASED_OUTPATIENT_CLINIC_OR_DEPARTMENT_OTHER): Payer: Medicare Other

## 2016-06-12 ENCOUNTER — Other Ambulatory Visit (HOSPITAL_BASED_OUTPATIENT_CLINIC_OR_DEPARTMENT_OTHER): Payer: Medicare Other

## 2016-06-12 ENCOUNTER — Encounter: Payer: Self-pay | Admitting: Hematology and Oncology

## 2016-06-12 VITALS — BP 108/53 | HR 75 | Temp 98.8°F | Resp 16

## 2016-06-12 DIAGNOSIS — D61818 Other pancytopenia: Secondary | ICD-10-CM | POA: Diagnosis not present

## 2016-06-12 DIAGNOSIS — C9110 Chronic lymphocytic leukemia of B-cell type not having achieved remission: Secondary | ICD-10-CM

## 2016-06-12 DIAGNOSIS — Z5112 Encounter for antineoplastic immunotherapy: Secondary | ICD-10-CM

## 2016-06-12 DIAGNOSIS — C8300 Small cell B-cell lymphoma, unspecified site: Secondary | ICD-10-CM

## 2016-06-12 LAB — COMPREHENSIVE METABOLIC PANEL
ALT: 25 U/L (ref 0–55)
ANION GAP: 6 meq/L (ref 3–11)
AST: 30 U/L (ref 5–34)
Albumin: 3.4 g/dL — ABNORMAL LOW (ref 3.5–5.0)
Alkaline Phosphatase: 72 U/L (ref 40–150)
BUN: 21.3 mg/dL (ref 7.0–26.0)
CALCIUM: 8.2 mg/dL — AB (ref 8.4–10.4)
CHLORIDE: 102 meq/L (ref 98–109)
CO2: 25 meq/L (ref 22–29)
CREATININE: 1 mg/dL (ref 0.7–1.3)
EGFR: 70 mL/min/{1.73_m2} — AB (ref >90)
Glucose: 126 mg/dl (ref 70–140)
POTASSIUM: 4.2 meq/L (ref 3.5–5.1)
Sodium: 134 mEq/L — ABNORMAL LOW (ref 136–145)
Total Bilirubin: 1.59 mg/dL — ABNORMAL HIGH (ref 0.20–1.20)
Total Protein: 5.7 g/dL — ABNORMAL LOW (ref 6.4–8.3)

## 2016-06-12 LAB — LACTATE DEHYDROGENASE: LDH: 192 U/L (ref 125–245)

## 2016-06-12 LAB — CBC WITH DIFFERENTIAL/PLATELET
BASO%: 0.5 % (ref 0.0–2.0)
BASOS ABS: 0 10*3/uL (ref 0.0–0.1)
EOS ABS: 0.2 10*3/uL (ref 0.0–0.5)
EOS%: 2.7 % (ref 0.0–7.0)
HEMATOCRIT: 33.1 % — AB (ref 38.4–49.9)
HEMOGLOBIN: 11.3 g/dL — AB (ref 13.0–17.1)
LYMPH#: 1.1 10*3/uL (ref 0.9–3.3)
LYMPH%: 18.4 % (ref 14.0–49.0)
MCH: 31 pg (ref 27.2–33.4)
MCHC: 34.1 g/dL (ref 32.0–36.0)
MCV: 90.9 fL (ref 79.3–98.0)
MONO#: 0.6 10*3/uL (ref 0.1–0.9)
MONO%: 9.9 % (ref 0.0–14.0)
NEUT%: 68.5 % (ref 39.0–75.0)
NEUTROS ABS: 4 10*3/uL (ref 1.5–6.5)
PLATELETS: 119 10*3/uL — AB (ref 140–400)
RBC: 3.64 10*6/uL — ABNORMAL LOW (ref 4.20–5.82)
RDW: 14.6 % (ref 11.0–14.6)
WBC: 5.9 10*3/uL (ref 4.0–10.3)

## 2016-06-12 LAB — URIC ACID: URIC ACID, SERUM: 3 mg/dL (ref 2.6–7.4)

## 2016-06-12 MED ORDER — SODIUM CHLORIDE 0.9 % IV SOLN
Freq: Once | INTRAVENOUS | Status: AC
  Administered 2016-06-12: 09:00:00 via INTRAVENOUS

## 2016-06-12 MED ORDER — ACETAMINOPHEN 325 MG PO TABS
650.0000 mg | ORAL_TABLET | Freq: Once | ORAL | Status: AC
  Administered 2016-06-12: 650 mg via ORAL

## 2016-06-12 MED ORDER — SODIUM CHLORIDE 0.9 % IV SOLN
20.0000 mg | Freq: Once | INTRAVENOUS | Status: AC
  Administered 2016-06-12: 20 mg via INTRAVENOUS
  Filled 2016-06-12: qty 2

## 2016-06-12 MED ORDER — DIPHENHYDRAMINE HCL 50 MG/ML IJ SOLN
INTRAMUSCULAR | Status: AC
  Filled 2016-06-12: qty 1

## 2016-06-12 MED ORDER — ACETAMINOPHEN 325 MG PO TABS
ORAL_TABLET | ORAL | Status: AC
  Filled 2016-06-12: qty 2

## 2016-06-12 MED ORDER — SODIUM CHLORIDE 0.9 % IV SOLN
1000.0000 mg | Freq: Once | INTRAVENOUS | Status: AC
  Administered 2016-06-12: 1000 mg via INTRAVENOUS
  Filled 2016-06-12: qty 40

## 2016-06-12 MED ORDER — DIPHENHYDRAMINE HCL 50 MG/ML IJ SOLN
50.0000 mg | Freq: Once | INTRAMUSCULAR | Status: AC
  Administered 2016-06-12: 50 mg via INTRAVENOUS

## 2016-06-12 NOTE — Assessment & Plan Note (Signed)
He is not symptomatic from anemia or thrombocytopenia. Observe only for now

## 2016-06-12 NOTE — Assessment & Plan Note (Addendum)
He tolerated treatment well without major side effects except for pancytopenia. His recent cold-like symptom is likely related to immune response. He had remarkable response to treatment with resolution of leukocytosis. The patient is pleasantly surprised with the result We will proceed with treatment without dose adjustment.

## 2016-06-12 NOTE — Progress Notes (Signed)
[facility] OFFICE PROGRESS NOTE  Patient Care Team: Lajean Manes, MD as PCP - General (Internal Medicine)  SUMMARY OF ONCOLOGIC HISTORY:   CLL (chronic lymphocytic leukemia) ([place])   10/28/2015 Pathology Results    Flow cytometry confirmed CLL      10/28/2015 Tumor Marker    FISH panel confirmed del 13 q      05/24/2016 PET scan    There is a mildly prominent AP window lymph node and numerous small axillary lymph nodes, but these are not hypermetabolic beyond the mediastinal blood pool activity. 2. Mild splenomegaly, without abnormal splenic activity. 3. Other imaging findings of potential clinical significance: Chronic left frontal and right maxillary sinusitis. Coronary, aortic arch, and branch vessel atherosclerotic vascular disease. Aortoiliac atherosclerotic vascular disease. Mildly prominent prostate gland. Right scrotal hydrocele. Spondylosis, with postoperative findings in the lumbar spine. Degenerative arthropathy of both hips.      06/05/2016 -  Chemotherapy    He was started on Gazyva       INTERVAL HISTORY: Please see below for problem oriented charting. He returns for further follow-up. He feels well. He had one episode of cold-like symptoms last week that resolved. He has some mild sinus allergy symptoms. No recent infection.  Denies recent lymphadenopathy  REVIEW OF SYSTEMS:   Constitutional: Denies fevers, chills or abnormal weight loss Eyes: Denies blurriness of vision Ears, nose, mouth, throat, and face: Denies mucositis or sore throat Respiratory: Denies cough, dyspnea or wheezes Cardiovascular: Denies palpitation, chest discomfort or lower extremity swelling Gastrointestinal:  Denies nausea, heartburn or change in bowel habits Skin: Denies abnormal skin rashes Lymphatics: Denies new lymphadenopathy or easy bruising Neurological:Denies numbness, tingling or new weaknesses Behavioral/Psych: Mood is stable, no new changes  All other systems  were reviewed with the patient and are negative.  I have reviewed the past medical history, past surgical history, social history and family history with the patient and they are unchanged from previous note.  ALLERGIES:  is allergic to sulfa antibiotics.  MEDICATIONS:  Current Outpatient Prescriptions  Medication Sig Dispense Refill  . acyclovir (ZOVIRAX) 400 MG tablet Take 1 tablet (400 mg total) by mouth daily. 30 tablet 5  . allopurinol (ZYLOPRIM) 300 MG tablet Take 1 tablet (300 mg total) by mouth daily. 30 tablet 0  . aspirin 81 MG tablet Take 81 mg by mouth daily.    Marland Kitchen escitalopram (LEXAPRO) 20 MG tablet Take 20 mg by mouth daily.    . finasteride (PROSCAR) 5 MG tablet Take 5 mg by mouth daily.    Marland Kitchen LORazepam (ATIVAN) 0.5 MG tablet     . Multiple Vitamins-Minerals (MULTIVITAMIN WITH MINERALS) tablet Take 1 tablet by mouth daily.    . ondansetron (ZOFRAN) 8 MG tablet Take 1 tablet (8 mg total) by mouth every 8 (eight) hours as needed for nausea. 30 tablet 3  . polyethylene glycol (MIRALAX / GLYCOLAX) packet Take 17 g by mouth 2 (two) times daily.    . predniSONE (DELTASONE) 20 MG tablet Take 1 tablet (20 mg total) by mouth daily with breakfast. 15 tablet 0  . temazepam (RESTORIL) 30 MG capsule Take 30 mg by mouth at bedtime as needed for sleep.    Marland Kitchen VITAMIN B1-B12 IM Inject into the muscle every 30 (thirty) days.     No current facility-administered medications for this visit.     PHYSICAL EXAMINATION: ECOG PERFORMANCE STATUS: 1 - Symptomatic but completely ambulatory  Vitals:   06/12/16 0814  BP: 125/60  Pulse:  92  Resp: 17  Temp: 98.6 F (37 C)   Filed Weights   06/12/16 0814  Weight: 161 lb (73 kg)    GENERAL:alert, no distress and comfortable SKIN: skin color, texture, turgor are normal, no rashes or significant lesions EYES: normal, Conjunctiva are pink and non-injected, sclera clear OROPHARYNX:no exudate, no erythema and lips, buccal mucosa, and tongue normal   NECK: supple, thyroid normal size, non-tender, without nodularity LYMPH:  no palpable lymphadenopathy in the cervical, axillary or inguinal LUNGS: clear to auscultation and percussion with normal breathing effort HEART: regular rate & rhythm and no murmurs and no lower extremity edema ABDOMEN:abdomen soft, non-tender and normal bowel sounds Musculoskeletal:no cyanosis of digits and no clubbing  NEURO: alert & oriented x 3 with fluent speech, no focal motor/sensory deficits  LABORATORY DATA:  I have reviewed the data as listed    Component Value Date/Time   NA 136 06/05/2016 1136   K 4.3 06/05/2016 1136   CL 99 04/06/2010 1045   CO2 25 06/05/2016 1136   GLUCOSE 134 06/05/2016 1136   BUN 22.8 06/05/2016 1136   CREATININE 1.1 06/05/2016 1136   CALCIUM 8.6 06/05/2016 1136   PROT 6.8 06/05/2016 1136   ALBUMIN 4.1 06/05/2016 1136   AST 25 06/05/2016 1136   ALT 19 06/05/2016 1136   ALKPHOS 94 06/05/2016 1136   BILITOT 1.57 (H) 06/05/2016 1136   GFRNONAA >60 04/06/2010 1045   GFRAA  04/06/2010 1045    >60        The eGFR has been calculated using the MDRD equation. This calculation has not been validated in all clinical situations. eGFR's persistently <60 mL/min signify possible Chronic Kidney Disease.    No results found for: SPEP, UPEP  Lab Results  Component Value Date   WBC 5.9 06/12/2016   NEUTROABS 4.0 06/12/2016   HGB 11.3 (L) 06/12/2016   HCT 33.1 (L) 06/12/2016   MCV 90.9 06/12/2016   PLT 119 (L) 06/12/2016      Chemistry      Component Value Date/Time   NA 136 06/05/2016 1136   K 4.3 06/05/2016 1136   CL 99 04/06/2010 1045   CO2 25 06/05/2016 1136   BUN 22.8 06/05/2016 1136   CREATININE 1.1 06/05/2016 1136      Component Value Date/Time   CALCIUM 8.6 06/05/2016 1136   ALKPHOS 94 06/05/2016 1136   AST 25 06/05/2016 1136   ALT 19 06/05/2016 1136   BILITOT 1.57 (H) 06/05/2016 1136       RADIOGRAPHIC STUDIES: I have personally reviewed the  radiological images as listed and agreed with the findings in the report. 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 EXAM: NUCLEAR MEDICINE PET SKULL BASE TO THIGH TECHNIQUE: 7.9 mCi F-18 FDG was injected intravenously. Full-ring PET imaging was performed from the skull base to thigh after the radiotracer. CT data was obtained and used for attenuation correction and anatomic localization. FASTING BLOOD GLUCOSE:  Value: 107 mg/dl COMPARISON:  Cervical and thoracic MRI dated 05/11/2013 FINDINGS: NECK No hypermetabolic lymph nodes in the neck. Polypoid mucoperiosteal thickening in the left frontal sinus. Chronic right maxillary sinusitis. Mild bilateral carotid bulb atherosclerotic calcification. Torus palatinus. CHEST There are numerous small axillary lymph nodes, particularly on the right side, most of which have fatty hila. The right axillary lymph nodes have maximum SUV of 2.2. Background mediastinal blood pool activity at 2.4. AP window  lymph node 1.3 cm in short axis, maximum SUV 2.4 Coronary, aortic arch, and branch vessel atherosclerotic vascular disease. Biapical pleuroparenchymal scarring. Mild dependent subsegmental atelectasis in both lungs. No worrisome nodules identified. ABDOMEN/PELVIS No abnormal hypermetabolic activity within the liver, pancreas, adrenal glands, or spleen. No hypermetabolic lymph nodes in the abdomen or pelvis. Physiologic activity in bowel and in the ureters. Aortoiliac atherosclerotic vascular disease. Mildly prominent prostate gland indents the bladder base. Right scrotal hydrocele. Background liver activity SUV 2.9. Mild splenomegaly, splenic volume 580 cubic cm, no abnormal splenic activity currently. SKELETON No focal hypermetabolic activity to suggest skeletal metastasis. Spondylosis noted. Degenerative right sternoclavicular arthropathy. Postoperative findings in the lumbar  spine. Lower lumbar degenerative facet arthropathy. Degenerative arthropathy of both hips. IMPRESSION: 1. There is a mildly prominent AP window lymph node and numerous small axillary lymph nodes, but these are not hypermetabolic beyond the mediastinal blood pool activity. 2. Mild splenomegaly, without abnormal splenic activity. 3. Other imaging findings of potential clinical significance: Chronic left frontal and right maxillary sinusitis. Coronary, aortic arch, and branch vessel atherosclerotic vascular disease. Aortoiliac atherosclerotic vascular disease. Mildly prominent prostate gland. Right scrotal hydrocele. Spondylosis, with postoperative findings in the lumbar spine. Degenerative arthropathy of both hips. Electronically Signed   By: Van Clines M.D.   On: 05/24/2016 10:07    ASSESSMENT & PLAN:  CLL (chronic lymphocytic leukemia) ([place]) He tolerated treatment well without major side effects except for pancytopenia. His recent cold-like symptom is likely related to immune response. He had remarkable response to treatment with resolution of leukocytosis. The patient is pleasantly surprised with the result We will proceed with treatment without dose adjustment.  Pancytopenia, acquired ([place]) He is not symptomatic from anemia or thrombocytopenia. Observe only for now   No orders of the defined types were placed in this encounter.  All questions were answered. The patient knows to call the clinic with any problems, questions or concerns. No barriers to learning was detected. I spent 15 minutes counseling the patient face to face. The total time spent in the appointment was 20 minutes and more than 50% was on counseling and review of test results     [name], MD 06/12/2016 8:32 AM

## 2016-06-12 NOTE — Patient Instructions (Signed)
[facility] Discharge Instructions for Patients Receiving Chemotherapy  Today you received the following chemotherapy agents: Gazyva   To help prevent nausea and vomiting after your treatment, we encourage you to take your nausea medication as directed.    If you develop nausea and vomiting that is not controlled by your nausea medication, call the clinic.   BELOW ARE SYMPTOMS THAT SHOULD BE REPORTED IMMEDIATELY:  *FEVER GREATER THAN 100.5 F  *CHILLS WITH OR WITHOUT FEVER  NAUSEA AND VOMITING THAT IS NOT CONTROLLED WITH YOUR NAUSEA MEDICATION  *UNUSUAL SHORTNESS OF BREATH  *UNUSUAL BRUISING OR BLEEDING  TENDERNESS IN MOUTH AND THROAT WITH OR WITHOUT PRESENCE OF ULCERS  *URINARY PROBLEMS  *BOWEL PROBLEMS  UNUSUAL RASH Items with * indicate a potential emergency and should be followed up as soon as possible.  Feel free to call the clinic you have any questions or concerns. The clinic phone number is [phone].  Please show the CHEMO ALERT CARD at check-in to the Emergency Department and triage nurse.   

## 2016-06-16 ENCOUNTER — Other Ambulatory Visit: Payer: Self-pay | Admitting: Nurse Practitioner

## 2016-06-16 ENCOUNTER — Telehealth: Payer: Self-pay | Admitting: Nurse Practitioner

## 2016-06-16 ENCOUNTER — Telehealth: Payer: Self-pay

## 2016-06-16 ENCOUNTER — Ambulatory Visit (HOSPITAL_BASED_OUTPATIENT_CLINIC_OR_DEPARTMENT_OTHER): Payer: Medicare Other

## 2016-06-16 DIAGNOSIS — Z79899 Other long term (current) drug therapy: Secondary | ICD-10-CM | POA: Diagnosis present

## 2016-06-16 DIAGNOSIS — R300 Dysuria: Secondary | ICD-10-CM

## 2016-06-16 DIAGNOSIS — N390 Urinary tract infection, site not specified: Secondary | ICD-10-CM

## 2016-06-16 DIAGNOSIS — C9110 Chronic lymphocytic leukemia of B-cell type not having achieved remission: Secondary | ICD-10-CM | POA: Diagnosis not present

## 2016-06-16 LAB — URINALYSIS, MICROSCOPIC - CHCC
BILIRUBIN (URINE): NEGATIVE
Glucose: NEGATIVE mg/dL
Ketones: NEGATIVE mg/dL
NITRITE: POSITIVE
Protein: NEGATIVE mg/dL
Specific Gravity, Urine: 1.01 (ref 1.003–1.035)
Urobilinogen, UR: 0.2 mg/dL (ref 0.2–1)
pH: 6 (ref 4.6–8.0)

## 2016-06-16 MED ORDER — CIPROFLOXACIN HCL 500 MG PO TABS: 500.0000 mg | ORAL_TABLET | Freq: Two times a day (BID) | ORAL | 0 refills | Status: DC

## 2016-06-16 NOTE — Telephone Encounter (Signed)
Patient called earlier this morning with complaint of foul-smelling urine.  He states that he has self cathed himself for several years.  He states he has frequent UTIs in the past as well.  He denies any recent fevers or chills; but does complain of some trace dysuria when he caths himself.  He denies any other new symptoms whatsoever.  Urinalysis obtained today did reveal positive UTI.  Urine culture results are pending.  This provider then called patient to review his urinalysis results.  Patient states that he typically take Cipro which has always managed all of his symptoms in the past.  Patient states that he has had no ill effects with Cipro in the past.  Will prescribe patient Cipro antibiotics for treatment of his UTI.  Also, will watch for the urine culture results as well.  Patient was advised to go directly to the emergency department for any worsening symptoms over the weekend.  Patient stated that he has an appointment this coming Monday 8 AM here at the cancer center as well.

## 2016-06-16 NOTE — Telephone Encounter (Signed)
Pt called stating he is a pt of Dr Alvy Bimler. He has been self cathing for many years. He has been having a change in his urine odor for the last 2 days. He states the color is good, he has been drinking plenty of water and the urine quantity is good. He denies fever.  s/w cyndee and had pt come in for ua & culture. He was instructed to go home and we will call him with the results. appt made and orders placed.

## 2016-06-18 LAB — URINE CULTURE

## 2016-06-19 ENCOUNTER — Other Ambulatory Visit (HOSPITAL_BASED_OUTPATIENT_CLINIC_OR_DEPARTMENT_OTHER): Payer: Medicare Other

## 2016-06-19 ENCOUNTER — Ambulatory Visit (HOSPITAL_BASED_OUTPATIENT_CLINIC_OR_DEPARTMENT_OTHER): Payer: Medicare Other

## 2016-06-19 VITALS — BP 110/80 | HR 69 | Temp 97.7°F | Resp 16

## 2016-06-19 DIAGNOSIS — Z5112 Encounter for antineoplastic immunotherapy: Secondary | ICD-10-CM | POA: Diagnosis present

## 2016-06-19 DIAGNOSIS — C9110 Chronic lymphocytic leukemia of B-cell type not having achieved remission: Secondary | ICD-10-CM | POA: Diagnosis present

## 2016-06-19 DIAGNOSIS — C8300 Small cell B-cell lymphoma, unspecified site: Secondary | ICD-10-CM

## 2016-06-19 LAB — CBC WITH DIFFERENTIAL/PLATELET
BASO%: 4.9 % — ABNORMAL HIGH (ref 0.0–2.0)
Basophils Absolute: 0.3 10*3/uL — ABNORMAL HIGH (ref 0.0–0.1)
EOS ABS: 0.2 10*3/uL (ref 0.0–0.5)
EOS%: 3.5 % (ref 0.0–7.0)
HEMATOCRIT: 33.5 % — AB (ref 38.4–49.9)
HEMOGLOBIN: 11.6 g/dL — AB (ref 13.0–17.1)
LYMPH#: 1.2 10*3/uL (ref 0.9–3.3)
LYMPH%: 20.2 % (ref 14.0–49.0)
MCH: 31.5 pg (ref 27.2–33.4)
MCHC: 34.7 g/dL (ref 32.0–36.0)
MCV: 90.9 fL (ref 79.3–98.0)
MONO#: 0.6 10*3/uL (ref 0.1–0.9)
MONO%: 9.9 % (ref 0.0–14.0)
NEUT#: 3.7 10*3/uL (ref 1.5–6.5)
NEUT%: 61.5 % (ref 39.0–75.0)
PLATELETS: 217 10*3/uL (ref 140–400)
RBC: 3.68 10*6/uL — ABNORMAL LOW (ref 4.20–5.82)
RDW: 15.1 % — AB (ref 11.0–14.6)
WBC: 6 10*3/uL (ref 4.0–10.3)

## 2016-06-19 LAB — COMPREHENSIVE METABOLIC PANEL
ALT: 22 U/L (ref 0–55)
AST: 25 U/L (ref 5–34)
Albumin: 3.6 g/dL (ref 3.5–5.0)
Alkaline Phosphatase: 67 U/L (ref 40–150)
Anion Gap: 8 mEq/L (ref 3–11)
BUN: 22.1 mg/dL (ref 7.0–26.0)
CHLORIDE: 102 meq/L (ref 98–109)
CO2: 26 meq/L (ref 22–29)
Calcium: 8.5 mg/dL (ref 8.4–10.4)
Creatinine: 1.1 mg/dL (ref 0.7–1.3)
EGFR: 62 mL/min/{1.73_m2} — AB (ref >90)
GLUCOSE: 77 mg/dL (ref 70–140)
POTASSIUM: 3.7 meq/L (ref 3.5–5.1)
SODIUM: 136 meq/L (ref 136–145)
TOTAL PROTEIN: 6 g/dL — AB (ref 6.4–8.3)
Total Bilirubin: 2.08 mg/dL — ABNORMAL HIGH (ref 0.20–1.20)

## 2016-06-19 MED ORDER — SODIUM CHLORIDE 0.9 % IV SOLN
Freq: Once | INTRAVENOUS | Status: AC
  Administered 2016-06-19: 08:00:00 via INTRAVENOUS

## 2016-06-19 MED ORDER — ACETAMINOPHEN 325 MG PO TABS
650.0000 mg | ORAL_TABLET | Freq: Once | ORAL | Status: AC
  Administered 2016-06-19: 650 mg via ORAL

## 2016-06-19 MED ORDER — DIPHENHYDRAMINE HCL 50 MG/ML IJ SOLN
INTRAMUSCULAR | Status: AC
  Filled 2016-06-19: qty 1

## 2016-06-19 MED ORDER — ACETAMINOPHEN 325 MG PO TABS
ORAL_TABLET | ORAL | Status: AC
Start: 2016-06-19 — End: 2016-06-19
  Filled 2016-06-19: qty 2

## 2016-06-19 MED ORDER — SODIUM CHLORIDE 0.9 % IV SOLN
20.0000 mg | Freq: Once | INTRAVENOUS | Status: AC
  Administered 2016-06-19: 20 mg via INTRAVENOUS
  Filled 2016-06-19: qty 2

## 2016-06-19 MED ORDER — DIPHENHYDRAMINE HCL 50 MG/ML IJ SOLN
50.0000 mg | Freq: Once | INTRAMUSCULAR | Status: AC
  Administered 2016-06-19: 50 mg via INTRAVENOUS

## 2016-06-19 MED ORDER — SODIUM CHLORIDE 0.9 % IV SOLN
1000.0000 mg | Freq: Once | INTRAVENOUS | Status: AC
  Administered 2016-06-19: 1000 mg via INTRAVENOUS
  Filled 2016-06-19: qty 40

## 2016-06-19 NOTE — Patient Instructions (Signed)
[facility] Discharge Instructions for Patients Receiving Chemotherapy  Today you received the following chemotherapy agents: Gazyva   To help prevent nausea and vomiting after your treatment, we encourage you to take your nausea medication as directed.    If you develop nausea and vomiting that is not controlled by your nausea medication, call the clinic.   BELOW ARE SYMPTOMS THAT SHOULD BE REPORTED IMMEDIATELY:  *FEVER GREATER THAN 100.5 F  *CHILLS WITH OR WITHOUT FEVER  NAUSEA AND VOMITING THAT IS NOT CONTROLLED WITH YOUR NAUSEA MEDICATION  *UNUSUAL SHORTNESS OF BREATH  *UNUSUAL BRUISING OR BLEEDING  TENDERNESS IN MOUTH AND THROAT WITH OR WITHOUT PRESENCE OF ULCERS  *URINARY PROBLEMS  *BOWEL PROBLEMS  UNUSUAL RASH Items with * indicate a potential emergency and should be followed up as soon as possible.  Feel free to call the clinic you have any questions or concerns. The clinic phone number is [phone].  Please show the CHEMO ALERT CARD at check-in to the Emergency Department and triage nurse.   

## 2016-06-30 DIAGNOSIS — F411 Generalized anxiety disorder: Secondary | ICD-10-CM | POA: Diagnosis not present

## 2016-07-03 ENCOUNTER — Other Ambulatory Visit (HOSPITAL_BASED_OUTPATIENT_CLINIC_OR_DEPARTMENT_OTHER): Payer: Medicare Other

## 2016-07-03 ENCOUNTER — Telehealth: Payer: Self-pay | Admitting: Hematology and Oncology

## 2016-07-03 ENCOUNTER — Encounter: Payer: Self-pay | Admitting: Hematology and Oncology

## 2016-07-03 ENCOUNTER — Ambulatory Visit (HOSPITAL_BASED_OUTPATIENT_CLINIC_OR_DEPARTMENT_OTHER): Payer: Medicare Other

## 2016-07-03 ENCOUNTER — Ambulatory Visit (HOSPITAL_BASED_OUTPATIENT_CLINIC_OR_DEPARTMENT_OTHER): Payer: Medicare Other | Admitting: Hematology and Oncology

## 2016-07-03 VITALS — BP 115/59 | HR 73 | Temp 98.3°F | Resp 18

## 2016-07-03 DIAGNOSIS — R634 Abnormal weight loss: Secondary | ICD-10-CM | POA: Insufficient documentation

## 2016-07-03 DIAGNOSIS — C9110 Chronic lymphocytic leukemia of B-cell type not having achieved remission: Secondary | ICD-10-CM

## 2016-07-03 DIAGNOSIS — D638 Anemia in other chronic diseases classified elsewhere: Secondary | ICD-10-CM

## 2016-07-03 DIAGNOSIS — C8300 Small cell B-cell lymphoma, unspecified site: Secondary | ICD-10-CM

## 2016-07-03 DIAGNOSIS — Z5112 Encounter for antineoplastic immunotherapy: Secondary | ICD-10-CM | POA: Diagnosis present

## 2016-07-03 LAB — CBC WITH DIFFERENTIAL/PLATELET
BASO%: 2 % (ref 0.0–2.0)
Basophils Absolute: 0.1 10*3/uL (ref 0.0–0.1)
EOS%: 1.9 % (ref 0.0–7.0)
Eosinophils Absolute: 0.1 10*3/uL (ref 0.0–0.5)
HCT: 35.2 % — ABNORMAL LOW (ref 38.4–49.9)
HGB: 12.1 g/dL — ABNORMAL LOW (ref 13.0–17.1)
LYMPH%: 28.4 % (ref 14.0–49.0)
MCH: 31.4 pg (ref 27.2–33.4)
MCHC: 34.4 g/dL (ref 32.0–36.0)
MCV: 91.2 fL (ref 79.3–98.0)
MONO#: 0.5 10*3/uL (ref 0.1–0.9)
MONO%: 10.8 % (ref 0.0–14.0)
NEUT#: 2.9 10*3/uL (ref 1.5–6.5)
NEUT%: 56.9 % (ref 39.0–75.0)
Platelets: 200 10*3/uL (ref 140–400)
RBC: 3.86 10*6/uL — ABNORMAL LOW (ref 4.20–5.82)
RDW: 15.4 % — ABNORMAL HIGH (ref 11.0–14.6)
WBC: 5.1 10*3/uL (ref 4.0–10.3)
lymph#: 1.4 10*3/uL (ref 0.9–3.3)

## 2016-07-03 LAB — COMPREHENSIVE METABOLIC PANEL WITH GFR
ALT: 21 U/L (ref 0–55)
AST: 33 U/L (ref 5–34)
Albumin: 3.7 g/dL (ref 3.5–5.0)
Alkaline Phosphatase: 61 U/L (ref 40–150)
Anion Gap: 7 meq/L (ref 3–11)
BUN: 20.5 mg/dL (ref 7.0–26.0)
CO2: 28 meq/L (ref 22–29)
Calcium: 8.7 mg/dL (ref 8.4–10.4)
Chloride: 101 meq/L (ref 98–109)
Creatinine: 1 mg/dL (ref 0.7–1.3)
EGFR: 66 mL/min/{1.73_m2} — ABNORMAL LOW
Glucose: 101 mg/dL (ref 70–140)
Potassium: 4 meq/L (ref 3.5–5.1)
Sodium: 137 meq/L (ref 136–145)
Total Bilirubin: 2.16 mg/dL — ABNORMAL HIGH (ref 0.20–1.20)
Total Protein: 6.1 g/dL — ABNORMAL LOW (ref 6.4–8.3)

## 2016-07-03 MED ORDER — DIPHENHYDRAMINE HCL 50 MG/ML IJ SOLN
50.0000 mg | Freq: Once | INTRAMUSCULAR | Status: AC
  Administered 2016-07-03: 50 mg via INTRAVENOUS

## 2016-07-03 MED ORDER — DIPHENHYDRAMINE HCL 50 MG/ML IJ SOLN
INTRAMUSCULAR | Status: AC
  Filled 2016-07-03: qty 1

## 2016-07-03 MED ORDER — ACETAMINOPHEN 325 MG PO TABS
ORAL_TABLET | ORAL | Status: AC
  Filled 2016-07-03: qty 2

## 2016-07-03 MED ORDER — SODIUM CHLORIDE 0.9 % IV SOLN
20.0000 mg | Freq: Once | INTRAVENOUS | Status: AC
  Administered 2016-07-03: 20 mg via INTRAVENOUS
  Filled 2016-07-03: qty 2

## 2016-07-03 MED ORDER — ACETAMINOPHEN 325 MG PO TABS
650.0000 mg | ORAL_TABLET | Freq: Once | ORAL | Status: AC
  Administered 2016-07-03: 650 mg via ORAL

## 2016-07-03 MED ORDER — SODIUM CHLORIDE 0.9 % IV SOLN
1000.0000 mg | Freq: Once | INTRAVENOUS | Status: AC
  Administered 2016-07-03: 1000 mg via INTRAVENOUS
  Filled 2016-07-03: qty 40

## 2016-07-03 MED ORDER — SODIUM CHLORIDE 0.9 % IV SOLN
Freq: Once | INTRAVENOUS | Status: AC
  Administered 2016-07-03: 09:00:00 via INTRAVENOUS

## 2016-07-03 MED ORDER — DIPHENHYDRAMINE HCL 25 MG PO CAPS
ORAL_CAPSULE | ORAL | Status: AC
  Filled 2016-07-03: qty 2

## 2016-07-03 NOTE — Assessment & Plan Note (Signed)
The elevated total bilirubin is likely due to Gilbert's syndrome. This has been present for long time and he is not symptomatic. Recommend close observation only

## 2016-07-03 NOTE — Telephone Encounter (Signed)
Gave patient AVS and calender per 4/9 los. 

## 2016-07-03 NOTE — Assessment & Plan Note (Signed)
He has mild weight loss.  I recommend increase oral intake as tolerated

## 2016-07-03 NOTE — Assessment & Plan Note (Signed)
He tolerated treatment well without major side effects  He had remarkable response to treatment with resolution of leukocytosis. The patient is pleasantly surprised with the result We will proceed with treatment without dose adjustment. I plan to give him minimum 3 cycles of treatment before repeat imaging study.  He will continue acyclovir for antiviral prophylaxis

## 2016-07-03 NOTE — Assessment & Plan Note (Signed)
This is likely anemia of chronic disease. The patient denies recent history of bleeding such as epistaxis, hematuria or hematochezia. He is asymptomatic from the anemia. We will observe for now.  

## 2016-07-03 NOTE — Patient Instructions (Signed)
[facility] Discharge Instructions for Patients Receiving Chemotherapy  Today you received the following chemotherapy agents:  Gazyva  To help prevent nausea and vomiting after your treatment, we encourage you to take your nausea medication as prescribed.   If you develop nausea and vomiting that is not controlled by your nausea medication, call the clinic.   BELOW ARE SYMPTOMS THAT SHOULD BE REPORTED IMMEDIATELY:  *FEVER GREATER THAN 100.5 F  *CHILLS WITH OR WITHOUT FEVER  NAUSEA AND VOMITING THAT IS NOT CONTROLLED WITH YOUR NAUSEA MEDICATION  *UNUSUAL SHORTNESS OF BREATH  *UNUSUAL BRUISING OR BLEEDING  TENDERNESS IN MOUTH AND THROAT WITH OR WITHOUT PRESENCE OF ULCERS  *URINARY PROBLEMS  *BOWEL PROBLEMS  UNUSUAL RASH Items with * indicate a potential emergency and should be followed up as soon as possible.  Feel free to call the clinic you have any questions or concerns. The clinic phone number is [phone].  Please show the CHEMO ALERT CARD at check-in to the Emergency Department and triage nurse.   

## 2016-07-03 NOTE — Progress Notes (Signed)
[facility] OFFICE PROGRESS NOTE  Patient Care Team: Lajean Manes, MD as PCP - General (Internal Medicine)  SUMMARY OF ONCOLOGIC HISTORY:   CLL (chronic lymphocytic leukemia) ([place])   10/28/2015 Pathology Results    Flow cytometry confirmed CLL      10/28/2015 Tumor Marker    FISH panel confirmed del 13 q      05/24/2016 PET scan    There is a mildly prominent AP window lymph node and numerous small axillary lymph nodes, but these are not hypermetabolic beyond the mediastinal blood pool activity. 2. Mild splenomegaly, without abnormal splenic activity. 3. Other imaging findings of potential clinical significance: Chronic left frontal and right maxillary sinusitis. Coronary, aortic arch, and branch vessel atherosclerotic vascular disease. Aortoiliac atherosclerotic vascular disease. Mildly prominent prostate gland. Right scrotal hydrocele. Spondylosis, with postoperative findings in the lumbar spine. Degenerative arthropathy of both hips.      06/05/2016 -  Chemotherapy    He was started on Gazyva       INTERVAL HISTORY: Please see below for problem oriented charting. He is seen prior to cycle 2 of treatment. He tolerated recent treatment well No recent infection No new lymphadenopathy. He has lost some weight  REVIEW OF SYSTEMS:   Constitutional: Denies fevers, chills Eyes: Denies blurriness of vision Ears, nose, mouth, throat, and face: Denies mucositis or sore throat Respiratory: Denies cough, dyspnea or wheezes Cardiovascular: Denies palpitation, chest discomfort or lower extremity swelling Gastrointestinal:  Denies nausea, heartburn or change in bowel habits Skin: Denies abnormal skin rashes Lymphatics: Denies new lymphadenopathy or easy bruising Neurological:Denies numbness, tingling or new weaknesses Behavioral/Psych: Mood is stable, no new changes  All other systems were reviewed with the patient and are negative.  I have reviewed the past medical  history, past surgical history, social history and family history with the patient and they are unchanged from previous note.  ALLERGIES:  is allergic to sulfa antibiotics.  MEDICATIONS:  Current Outpatient Prescriptions  Medication Sig Dispense Refill  . acyclovir (ZOVIRAX) 400 MG tablet Take 1 tablet (400 mg total) by mouth daily. 30 tablet 5  . aspirin 81 MG tablet Take 81 mg by mouth daily.    Marland Kitchen escitalopram (LEXAPRO) 20 MG tablet Take 20 mg by mouth daily.    . finasteride (PROSCAR) 5 MG tablet Take 5 mg by mouth daily.    . Multiple Vitamins-Minerals (MULTIVITAMIN WITH MINERALS) tablet Take 1 tablet by mouth daily.    . polyethylene glycol (MIRALAX / GLYCOLAX) packet Take 17 g by mouth 2 (two) times daily.    . temazepam (RESTORIL) 30 MG capsule Take 30 mg by mouth at bedtime as needed for sleep.    Marland Kitchen VITAMIN B1-B12 IM Inject into the muscle every 30 (thirty) days.    Marland Kitchen LORazepam (ATIVAN) 0.5 MG tablet     . ondansetron (ZOFRAN) 8 MG tablet Take 1 tablet (8 mg total) by mouth every 8 (eight) hours as needed for nausea. (Patient not taking: Reported on 07/03/2016) 30 tablet 3   No current facility-administered medications for this visit.     PHYSICAL EXAMINATION: ECOG PERFORMANCE STATUS: 0 - Asymptomatic  Vitals:   07/03/16 0810  BP: 111/61  Pulse: 84  Resp: 18  Temp: 98.2 F (36.8 C)   Filed Weights   07/03/16 0810  Weight: 156 lb 3.2 oz (70.9 kg)    GENERAL:alert, no distress and comfortable SKIN: skin color, texture, turgor are normal, no rashes or significant lesions EYES: normal, Conjunctiva  are pink and non-injected, sclera clear OROPHARYNX:no exudate, no erythema and lips, buccal mucosa, and tongue normal  NECK: supple, thyroid normal size, non-tender, without nodularity LYMPH:  no palpable lymphadenopathy in the cervical, axillary or inguinal LUNGS: clear to auscultation and percussion with normal breathing effort HEART: regular rate & rhythm and no murmurs and  no lower extremity edema ABDOMEN:abdomen soft, non-tender and normal bowel sounds Musculoskeletal:no cyanosis of digits and no clubbing  NEURO: alert & oriented x 3 with fluent speech, no focal motor/sensory deficits  LABORATORY DATA:  I have reviewed the data as listed    Component Value Date/Time   NA 136 06/19/2016 0747   K 3.7 06/19/2016 0747   CL 99 04/06/2010 1045   CO2 26 06/19/2016 0747   GLUCOSE 77 06/19/2016 0747   BUN 22.1 06/19/2016 0747   CREATININE 1.1 06/19/2016 0747   CALCIUM 8.5 06/19/2016 0747   PROT 6.0 (L) 06/19/2016 0747   ALBUMIN 3.6 06/19/2016 0747   AST 25 06/19/2016 0747   ALT 22 06/19/2016 0747   ALKPHOS 67 06/19/2016 0747   BILITOT 2.08 (H) 06/19/2016 0747   GFRNONAA >60 04/06/2010 1045   GFRAA  04/06/2010 1045    >60        The eGFR has been calculated using the MDRD equation. This calculation has not been validated in all clinical situations. eGFR's persistently <60 mL/min signify possible Chronic Kidney Disease.    No results found for: SPEP, UPEP  Lab Results  Component Value Date   WBC 5.1 07/03/2016   NEUTROABS 2.9 07/03/2016   HGB 12.1 (L) 07/03/2016   HCT 35.2 (L) 07/03/2016   MCV 91.2 07/03/2016   PLT 200 07/03/2016      Chemistry      Component Value Date/Time   NA 136 06/19/2016 0747   K 3.7 06/19/2016 0747   CL 99 04/06/2010 1045   CO2 26 06/19/2016 0747   BUN 22.1 06/19/2016 0747   CREATININE 1.1 06/19/2016 0747      Component Value Date/Time   CALCIUM 8.5 06/19/2016 0747   ALKPHOS 67 06/19/2016 0747   AST 25 06/19/2016 0747   ALT 22 06/19/2016 0747   BILITOT 2.08 (H) 06/19/2016 0747       ASSESSMENT & PLAN:  CLL (chronic lymphocytic leukemia) ([place]) He tolerated treatment well without major side effects  He had remarkable response to treatment with resolution of leukocytosis. The patient is pleasantly surprised with the result We will proceed with treatment without dose adjustment. I plan to give him  minimum 3 cycles of treatment before repeat imaging study.  He will continue acyclovir for antiviral prophylaxis  Anemia due to chronic illness This is likely anemia of chronic disease. The patient denies recent history of bleeding such as epistaxis, hematuria or hematochezia. He is asymptomatic from the anemia. We will observe for now.   Gilbert's syndrome The elevated total bilirubin is likely due to Gilbert's syndrome. This has been present for long time and he is not symptomatic. Recommend close observation only  Weight loss He has mild weight loss.  I recommend increase oral intake as tolerated   No orders of the defined types were placed in this encounter.  All questions were answered. The patient knows to call the clinic with any problems, questions or concerns. No barriers to learning was detected. I spent 15 minutes counseling the patient face to face. The total time spent in the appointment was 20 minutes and more than 50% was on counseling  and review of test results     [name], MD 07/03/2016 8:22 AM

## 2016-07-31 ENCOUNTER — Encounter: Payer: Self-pay | Admitting: Hematology and Oncology

## 2016-07-31 ENCOUNTER — Ambulatory Visit (HOSPITAL_BASED_OUTPATIENT_CLINIC_OR_DEPARTMENT_OTHER): Payer: Medicare Other | Admitting: Hematology and Oncology

## 2016-07-31 ENCOUNTER — Other Ambulatory Visit (HOSPITAL_BASED_OUTPATIENT_CLINIC_OR_DEPARTMENT_OTHER): Payer: Medicare Other

## 2016-07-31 ENCOUNTER — Ambulatory Visit (HOSPITAL_BASED_OUTPATIENT_CLINIC_OR_DEPARTMENT_OTHER): Payer: Medicare Other

## 2016-07-31 VITALS — BP 129/67 | HR 66 | Temp 98.0°F | Resp 16

## 2016-07-31 VITALS — BP 124/57 | HR 78 | Temp 98.5°F | Resp 18 | Ht 72.0 in | Wt 154.5 lb

## 2016-07-31 DIAGNOSIS — C9110 Chronic lymphocytic leukemia of B-cell type not having achieved remission: Secondary | ICD-10-CM | POA: Diagnosis present

## 2016-07-31 DIAGNOSIS — Z5112 Encounter for antineoplastic immunotherapy: Secondary | ICD-10-CM

## 2016-07-31 DIAGNOSIS — D61818 Other pancytopenia: Secondary | ICD-10-CM

## 2016-07-31 DIAGNOSIS — K5909 Other constipation: Secondary | ICD-10-CM | POA: Diagnosis not present

## 2016-07-31 DIAGNOSIS — C9190 Lymphoid leukemia, unspecified not having achieved remission: Secondary | ICD-10-CM

## 2016-07-31 DIAGNOSIS — C8300 Small cell B-cell lymphoma, unspecified site: Secondary | ICD-10-CM

## 2016-07-31 LAB — COMPREHENSIVE METABOLIC PANEL
ALBUMIN: 3.8 g/dL (ref 3.5–5.0)
ALK PHOS: 49 U/L (ref 40–150)
ALT: 18 U/L (ref 0–55)
AST: 31 U/L (ref 5–34)
Anion Gap: 8 mEq/L (ref 3–11)
BUN: 19.2 mg/dL (ref 7.0–26.0)
CALCIUM: 8.9 mg/dL (ref 8.4–10.4)
CHLORIDE: 101 meq/L (ref 98–109)
CO2: 28 mEq/L (ref 22–29)
Creatinine: 1 mg/dL (ref 0.7–1.3)
EGFR: 63 mL/min/{1.73_m2} — AB (ref >90)
GLUCOSE: 86 mg/dL (ref 70–140)
POTASSIUM: 4.1 meq/L (ref 3.5–5.1)
SODIUM: 137 meq/L (ref 136–145)
Total Bilirubin: 1.98 mg/dL — ABNORMAL HIGH (ref 0.20–1.20)
Total Protein: 6.3 g/dL — ABNORMAL LOW (ref 6.4–8.3)

## 2016-07-31 LAB — CBC WITH DIFFERENTIAL/PLATELET
BASO%: 1 % (ref 0.0–2.0)
Basophils Absolute: 0.1 10*3/uL (ref 0.0–0.1)
EOS ABS: 0.1 10*3/uL (ref 0.0–0.5)
EOS%: 1.8 % (ref 0.0–7.0)
HEMATOCRIT: 35.1 % — AB (ref 38.4–49.9)
HGB: 12.1 g/dL — ABNORMAL LOW (ref 13.0–17.1)
LYMPH#: 2 10*3/uL (ref 0.9–3.3)
LYMPH%: 30 % (ref 14.0–49.0)
MCH: 31.7 pg (ref 27.2–33.4)
MCHC: 34.6 g/dL (ref 32.0–36.0)
MCV: 91.6 fL (ref 79.3–98.0)
MONO#: 0.7 10*3/uL (ref 0.1–0.9)
MONO%: 10.5 % (ref 0.0–14.0)
NEUT%: 56.7 % (ref 39.0–75.0)
NEUTROS ABS: 3.8 10*3/uL (ref 1.5–6.5)
PLATELETS: 232 10*3/uL (ref 140–400)
RBC: 3.83 10*6/uL — ABNORMAL LOW (ref 4.20–5.82)
RDW: 14.4 % (ref 11.0–14.6)
WBC: 6.6 10*3/uL (ref 4.0–10.3)

## 2016-07-31 MED ORDER — SODIUM CHLORIDE 0.9 % IV SOLN
Freq: Once | INTRAVENOUS | Status: AC
  Administered 2016-07-31: 10:00:00 via INTRAVENOUS

## 2016-07-31 MED ORDER — ACETAMINOPHEN 325 MG PO TABS
650.0000 mg | ORAL_TABLET | Freq: Once | ORAL | Status: AC
  Administered 2016-07-31: 650 mg via ORAL

## 2016-07-31 MED ORDER — SODIUM CHLORIDE 0.9 % IV SOLN
20.0000 mg | Freq: Once | INTRAVENOUS | Status: AC
  Administered 2016-07-31: 20 mg via INTRAVENOUS
  Filled 2016-07-31: qty 2

## 2016-07-31 MED ORDER — ACETAMINOPHEN 325 MG PO TABS
ORAL_TABLET | ORAL | Status: AC
  Filled 2016-07-31: qty 2

## 2016-07-31 MED ORDER — DIPHENHYDRAMINE HCL 50 MG/ML IJ SOLN
INTRAMUSCULAR | Status: AC
  Filled 2016-07-31: qty 1

## 2016-07-31 MED ORDER — OBINUTUZUMAB CHEMO INJECTION 1000 MG/40ML
1000.0000 mg | Freq: Once | INTRAVENOUS | Status: AC
  Administered 2016-07-31: 1000 mg via INTRAVENOUS
  Filled 2016-07-31: qty 40

## 2016-07-31 MED ORDER — DIPHENHYDRAMINE HCL 50 MG/ML IJ SOLN
50.0000 mg | Freq: Once | INTRAMUSCULAR | Status: AC
  Administered 2016-07-31: 50 mg via INTRAVENOUS

## 2016-07-31 NOTE — Assessment & Plan Note (Signed)
The elevated total bilirubin is likely due to Gilbert's syndrome. This has been present for long time and he is not symptomatic. Recommend close observation only

## 2016-07-31 NOTE — Progress Notes (Signed)
[facility] OFFICE PROGRESS NOTE  Patient Care Team: Lajean Manes, MD as PCP - General (Internal Medicine)  SUMMARY OF ONCOLOGIC HISTORY:   CLL (chronic lymphocytic leukemia) ([place])   10/28/2015 Pathology Results    Flow cytometry confirmed CLL      10/28/2015 Tumor Marker    FISH panel confirmed del 13 q      05/24/2016 PET scan    There is a mildly prominent AP window lymph node and numerous small axillary lymph nodes, but these are not hypermetabolic beyond the mediastinal blood pool activity. 2. Mild splenomegaly, without abnormal splenic activity. 3. Other imaging findings of potential clinical significance: Chronic left frontal and right maxillary sinusitis. Coronary, aortic arch, and branch vessel atherosclerotic vascular disease. Aortoiliac atherosclerotic vascular disease. Mildly prominent prostate gland. Right scrotal hydrocele. Spondylosis, with postoperative findings in the lumbar spine. Degenerative arthropathy of both hips.      06/05/2016 -  Chemotherapy    He was started on Gazyva       INTERVAL HISTORY: Please see below for problem oriented charting. He returns for follow-up and chemo His only complaint is recent constipation, resolved with laxative Denies recent infection Appetite is stable No new lymphadenopathy  REVIEW OF SYSTEMS:   Constitutional: Denies fevers, chills or abnormal weight loss Eyes: Denies blurriness of vision Ears, nose, mouth, throat, and face: Denies mucositis or sore throat Respiratory: Denies cough, dyspnea or wheezes Cardiovascular: Denies palpitation, chest discomfort or lower extremity swelling Gastrointestinal:  Denies nausea, heartburn or change in bowel habits Skin: Denies abnormal skin rashes Lymphatics: Denies new lymphadenopathy or easy bruising Neurological:Denies numbness, tingling or new weaknesses Behavioral/Psych: Mood is stable, no new changes  All other systems were reviewed with the patient and are  negative.  I have reviewed the past medical history, past surgical history, social history and family history with the patient and they are unchanged from previous note.  ALLERGIES:  is allergic to sulfa antibiotics.  MEDICATIONS:  Current Outpatient Prescriptions  Medication Sig Dispense Refill  . acyclovir (ZOVIRAX) 400 MG tablet Take 1 tablet (400 mg total) by mouth daily. 30 tablet 5  . aspirin 81 MG tablet Take 81 mg by mouth daily.    Marland Kitchen escitalopram (LEXAPRO) 20 MG tablet Take 20 mg by mouth daily.    . finasteride (PROSCAR) 5 MG tablet Take 5 mg by mouth daily.    . Multiple Vitamins-Minerals (MULTIVITAMIN WITH MINERALS) tablet Take 1 tablet by mouth daily.    . polyethylene glycol (MIRALAX / GLYCOLAX) packet Take 17 g by mouth 2 (two) times daily.    . temazepam (RESTORIL) 30 MG capsule Take 30 mg by mouth at bedtime as needed for sleep. Patient takes 15 mg    . VITAMIN B1-B12 IM Inject into the muscle every 30 (thirty) days.    Marland Kitchen LORazepam (ATIVAN) 0.5 MG tablet     . ondansetron (ZOFRAN) 8 MG tablet Take 1 tablet (8 mg total) by mouth every 8 (eight) hours as needed for nausea. (Patient not taking: Reported on 07/03/2016) 30 tablet 3   No current facility-administered medications for this visit.    Facility-Administered Medications Ordered in Other Visits  Medication Dose Route Frequency Provider Last Rate Last Dose  . obinutuzumab (GAZYVA) 1,000 mg in sodium chloride 0.9 % 250 mL (3.4483 mg/mL) chemo infusion  1,000 mg Intravenous Once [name], MD        PHYSICAL EXAMINATION: ECOG PERFORMANCE STATUS: 0 - Asymptomatic  Vitals:   07/31/16 0847  BP: (!) 124/57  Pulse: 78  Resp: 18  Temp: 98.5 F (36.9 C)   Filed Weights   07/31/16 0847  Weight: 154 lb 8 oz (70.1 kg)    GENERAL:alert, no distress and comfortable SKIN: skin color, texture, turgor are normal, no rashes or significant lesions EYES: normal, Conjunctiva are pink and non-injected, sclera  clear OROPHARYNX:no exudate, no erythema and lips, buccal mucosa, and tongue normal  NECK: supple, thyroid normal size, non-tender, without nodularity LYMPH:  no palpable lymphadenopathy in the cervical, axillary or inguinal LUNGS: clear to auscultation and percussion with normal breathing effort HEART: regular rate & rhythm and no murmurs and no lower extremity edema ABDOMEN:abdomen soft, non-tender and normal bowel sounds Musculoskeletal:no cyanosis of digits and no clubbing  NEURO: alert & oriented x 3 with fluent speech, no focal motor/sensory deficits  LABORATORY DATA:  I have reviewed the data as listed    Component Value Date/Time   NA 137 07/31/2016 0811   K 4.1 07/31/2016 0811   CL 99 04/06/2010 1045   CO2 28 07/31/2016 0811   GLUCOSE 86 07/31/2016 0811   BUN 19.2 07/31/2016 0811   CREATININE 1.0 07/31/2016 0811   CALCIUM 8.9 07/31/2016 0811   PROT 6.3 (L) 07/31/2016 0811   ALBUMIN 3.8 07/31/2016 0811   AST 31 07/31/2016 0811   ALT 18 07/31/2016 0811   ALKPHOS 49 07/31/2016 0811   BILITOT 1.98 (H) 07/31/2016 0811   GFRNONAA >60 04/06/2010 1045   GFRAA  04/06/2010 1045    >60        The eGFR has been calculated using the MDRD equation. This calculation has not been validated in all clinical situations. eGFR's persistently <60 mL/min signify possible Chronic Kidney Disease.    No results found for: SPEP, UPEP  Lab Results  Component Value Date   WBC 6.6 07/31/2016   NEUTROABS 3.8 07/31/2016   HGB 12.1 (L) 07/31/2016   HCT 35.1 (L) 07/31/2016   MCV 91.6 07/31/2016   PLT 232 07/31/2016      Chemistry      Component Value Date/Time   NA 137 07/31/2016 0811   K 4.1 07/31/2016 0811   CL 99 04/06/2010 1045   CO2 28 07/31/2016 0811   BUN 19.2 07/31/2016 0811   CREATININE 1.0 07/31/2016 0811      Component Value Date/Time   CALCIUM 8.9 07/31/2016 0811   ALKPHOS 49 07/31/2016 0811   AST 31 07/31/2016 0811   ALT 18 07/31/2016 0811   BILITOT 1.98 (H)  07/31/2016 0811       ASSESSMENT & PLAN:  CLL (chronic lymphocytic leukemia) ([place]) He tolerated treatment well without major side effects  He had remarkable response to treatment with resolution of leukocytosis. We will proceed with treatment without dose adjustment. He will continue acyclovir for antiviral prophylaxis I plan to repeat imaging study before his next treatment  Gilbert's syndrome The elevated total bilirubin is likely due to Gilbert's syndrome. This has been present for long time and he is not symptomatic. Recommend close observation only  Pancytopenia, acquired ([place]) He has mild constipation, resolved with laxative, unlikely due to chemotherapy I recommend he takes laxatives on a regular basis   Orders Placed This Encounter  Procedures  . NM PET Image Restag (PS) Skull Base To Thigh    Standing Status:   Future    Standing Expiration Date:   09/04/2017    Order Specific Question:   Reason for exam:    Answer:  staging CLL    Order Specific Question:   Preferred imaging location?    Answer:   [facility]   All questions were answered. The patient knows to call the clinic with any problems, questions or concerns. No barriers to learning was detected. I spent 15 minutes counseling the patient face to face. The total time spent in the appointment was 20 minutes and more than 50% was on counseling and review of test results     [name], MD 07/31/2016 10:26 AM

## 2016-07-31 NOTE — Assessment & Plan Note (Signed)
He tolerated treatment well without major side effects  He had remarkable response to treatment with resolution of leukocytosis. We will proceed with treatment without dose adjustment. He will continue acyclovir for antiviral prophylaxis I plan to repeat imaging study before his next treatment

## 2016-07-31 NOTE — Assessment & Plan Note (Signed)
He has mild constipation, resolved with laxative, unlikely due to chemotherapy I recommend he takes laxatives on a regular basis

## 2016-07-31 NOTE — Patient Instructions (Signed)
[facility] Discharge Instructions for Patients Receiving Chemotherapy  Today you received the following chemotherapy agents: Gazyva   To help prevent nausea and vomiting after your treatment, we encourage you to take your nausea medication as directed.    If you develop nausea and vomiting that is not controlled by your nausea medication, call the clinic.   BELOW ARE SYMPTOMS THAT SHOULD BE REPORTED IMMEDIATELY:  *FEVER GREATER THAN 100.5 F  *CHILLS WITH OR WITHOUT FEVER  NAUSEA AND VOMITING THAT IS NOT CONTROLLED WITH YOUR NAUSEA MEDICATION  *UNUSUAL SHORTNESS OF BREATH  *UNUSUAL BRUISING OR BLEEDING  TENDERNESS IN MOUTH AND THROAT WITH OR WITHOUT PRESENCE OF ULCERS  *URINARY PROBLEMS  *BOWEL PROBLEMS  UNUSUAL RASH Items with * indicate a potential emergency and should be followed up as soon as possible.  Feel free to call the clinic you have any questions or concerns. The clinic phone number is [phone].  Please show the CHEMO ALERT CARD at check-in to the Emergency Department and triage nurse.   

## 2016-08-24 ENCOUNTER — Other Ambulatory Visit: Payer: Self-pay | Admitting: Hematology and Oncology

## 2016-08-25 ENCOUNTER — Other Ambulatory Visit (HOSPITAL_BASED_OUTPATIENT_CLINIC_OR_DEPARTMENT_OTHER): Payer: Medicare Other

## 2016-08-25 ENCOUNTER — Ambulatory Visit (HOSPITAL_COMMUNITY)
Admission: RE | Admit: 2016-08-25 | Discharge: 2016-08-25 | Disposition: A | Discharge status: Home / Self Care | Payer: Medicare Other | Source: Ambulatory Visit | Attending: Hematology and Oncology | Admitting: Hematology and Oncology

## 2016-08-25 DIAGNOSIS — C9190 Lymphoid leukemia, unspecified not having achieved remission: Secondary | ICD-10-CM | POA: Diagnosis not present

## 2016-08-25 DIAGNOSIS — I2510 Atherosclerotic heart disease of native coronary artery without angina pectoris: Secondary | ICD-10-CM | POA: Diagnosis not present

## 2016-08-25 DIAGNOSIS — N400 Enlarged prostate without lower urinary tract symptoms: Secondary | ICD-10-CM | POA: Insufficient documentation

## 2016-08-25 DIAGNOSIS — K5900 Constipation, unspecified: Secondary | ICD-10-CM | POA: Insufficient documentation

## 2016-08-25 DIAGNOSIS — C9111 Chronic lymphocytic leukemia of B-cell type in remission: Secondary | ICD-10-CM | POA: Diagnosis not present

## 2016-08-25 DIAGNOSIS — J321 Chronic frontal sinusitis: Secondary | ICD-10-CM | POA: Diagnosis not present

## 2016-08-25 DIAGNOSIS — C9110 Chronic lymphocytic leukemia of B-cell type not having achieved remission: Secondary | ICD-10-CM

## 2016-08-25 DIAGNOSIS — N433 Hydrocele, unspecified: Secondary | ICD-10-CM | POA: Insufficient documentation

## 2016-08-25 LAB — COMPREHENSIVE METABOLIC PANEL
ALBUMIN: 4 g/dL (ref 3.5–5.0)
ALK PHOS: 59 U/L (ref 40–150)
ALT: 17 U/L (ref 0–55)
ANION GAP: 8 meq/L (ref 3–11)
AST: 29 U/L (ref 5–34)
BUN: 17.4 mg/dL (ref 7.0–26.0)
CALCIUM: 9.2 mg/dL (ref 8.4–10.4)
CO2: 29 mEq/L (ref 22–29)
Chloride: 102 mEq/L (ref 98–109)
Creatinine: 1.1 mg/dL (ref 0.7–1.3)
EGFR: 60 mL/min/{1.73_m2} — ABNORMAL LOW (ref >90)
Glucose: 98 mg/dl (ref 70–140)
POTASSIUM: 4.1 meq/L (ref 3.5–5.1)
Sodium: 139 mEq/L (ref 136–145)
Total Bilirubin: 2.07 mg/dL — ABNORMAL HIGH (ref 0.20–1.20)
Total Protein: 6.7 g/dL (ref 6.4–8.3)

## 2016-08-25 LAB — CBC WITH DIFFERENTIAL/PLATELET
BASO%: 1.4 % (ref 0.0–2.0)
Basophils Absolute: 0.1 10*3/uL (ref 0.0–0.1)
EOS%: 2.5 % (ref 0.0–7.0)
Eosinophils Absolute: 0.2 10*3/uL (ref 0.0–0.5)
HEMATOCRIT: 38.8 % (ref 38.4–49.9)
HGB: 12.8 g/dL — ABNORMAL LOW (ref 13.0–17.1)
LYMPH#: 1.6 10*3/uL (ref 0.9–3.3)
LYMPH%: 27.3 % (ref 14.0–49.0)
MCH: 30.2 pg (ref 27.2–33.4)
MCHC: 33 g/dL (ref 32.0–36.0)
MCV: 91.5 fL (ref 79.3–98.0)
MONO#: 0.7 10*3/uL (ref 0.1–0.9)
MONO%: 11.9 % (ref 0.0–14.0)
NEUT%: 56.9 % (ref 39.0–75.0)
NEUTROS ABS: 3.4 10*3/uL (ref 1.5–6.5)
PLATELETS: 208 10*3/uL (ref 140–400)
RBC: 4.24 10*6/uL (ref 4.20–5.82)
RDW: 13.4 % (ref 11.0–14.6)
WBC: 5.9 10*3/uL (ref 4.0–10.3)

## 2016-08-25 LAB — GLUCOSE, CAPILLARY: Glucose-Capillary: 102 mg/dL — ABNORMAL HIGH (ref 65–99)

## 2016-08-25 MED ORDER — FLUDEOXYGLUCOSE F - 18 (FDG) INJECTION: 927.0000 | Freq: Once | INTRAVENOUS | Status: DC | PRN

## 2016-08-25 MED ORDER — FLUDEOXYGLUCOSE F - 18 (FDG) INJECTION
9.2700 | Freq: Once | INTRAVENOUS | Status: AC | PRN
  Administered 2016-08-25: 7.7 via INTRAVENOUS

## 2016-08-28 ENCOUNTER — Ambulatory Visit (HOSPITAL_BASED_OUTPATIENT_CLINIC_OR_DEPARTMENT_OTHER): Payer: Medicare Other | Admitting: Hematology and Oncology

## 2016-08-28 ENCOUNTER — Ambulatory Visit (HOSPITAL_BASED_OUTPATIENT_CLINIC_OR_DEPARTMENT_OTHER): Payer: Medicare Other

## 2016-08-28 ENCOUNTER — Telehealth: Payer: Self-pay | Admitting: Hematology and Oncology

## 2016-08-28 VITALS — BP 153/73 | HR 74 | Temp 98.6°F | Resp 17

## 2016-08-28 DIAGNOSIS — D638 Anemia in other chronic diseases classified elsewhere: Secondary | ICD-10-CM

## 2016-08-28 DIAGNOSIS — Z5112 Encounter for antineoplastic immunotherapy: Secondary | ICD-10-CM | POA: Diagnosis present

## 2016-08-28 DIAGNOSIS — C9110 Chronic lymphocytic leukemia of B-cell type not having achieved remission: Secondary | ICD-10-CM

## 2016-08-28 DIAGNOSIS — C8300 Small cell B-cell lymphoma, unspecified site: Secondary | ICD-10-CM

## 2016-08-28 MED ORDER — ACETAMINOPHEN 325 MG PO TABS
ORAL_TABLET | ORAL | Status: AC
  Filled 2016-08-28: qty 2

## 2016-08-28 MED ORDER — SODIUM CHLORIDE 0.9 % IV SOLN
20.0000 mg | Freq: Once | INTRAVENOUS | Status: AC
  Administered 2016-08-28: 20 mg via INTRAVENOUS
  Filled 2016-08-28: qty 2

## 2016-08-28 MED ORDER — ACETAMINOPHEN 325 MG PO TABS
650.0000 mg | ORAL_TABLET | Freq: Once | ORAL | Status: AC
  Administered 2016-08-28: 650 mg via ORAL

## 2016-08-28 MED ORDER — DIPHENHYDRAMINE HCL 50 MG/ML IJ SOLN
INTRAMUSCULAR | Status: AC
  Filled 2016-08-28: qty 1

## 2016-08-28 MED ORDER — DIPHENHYDRAMINE HCL 50 MG/ML IJ SOLN
50.0000 mg | Freq: Once | INTRAMUSCULAR | Status: AC
  Administered 2016-08-28: 50 mg via INTRAVENOUS

## 2016-08-28 MED ORDER — SODIUM CHLORIDE 0.9 % IV SOLN
Freq: Once | INTRAVENOUS | Status: AC
  Administered 2016-08-28: 09:00:00 via INTRAVENOUS

## 2016-08-28 MED ORDER — DIPHENHYDRAMINE HCL 25 MG PO CAPS
ORAL_CAPSULE | ORAL | Status: AC
  Filled 2016-08-28: qty 2

## 2016-08-28 MED ORDER — SODIUM CHLORIDE 0.9 % IV SOLN
1000.0000 mg | Freq: Once | INTRAVENOUS | Status: AC
  Administered 2016-08-28: 1000 mg via INTRAVENOUS
  Filled 2016-08-28: qty 40

## 2016-08-28 NOTE — Patient Instructions (Signed)
[facility] Discharge Instructions for Patients Receiving Chemotherapy  Today you received the following agents: Obinutuzumab.  To help prevent nausea and vomiting after your treatment, we encourage you to take your nausea medication: Zofran. Take one every 8 hours as needed.   If you develop nausea and vomiting that is not controlled by your nausea medication, call the clinic.   BELOW ARE SYMPTOMS THAT SHOULD BE REPORTED IMMEDIATELY:  *FEVER GREATER THAN 100.5 F  *CHILLS WITH OR WITHOUT FEVER  NAUSEA AND VOMITING THAT IS NOT CONTROLLED WITH YOUR NAUSEA MEDICATION  *UNUSUAL SHORTNESS OF BREATH  *UNUSUAL BRUISING OR BLEEDING  TENDERNESS IN MOUTH AND THROAT WITH OR WITHOUT PRESENCE OF ULCERS  *URINARY PROBLEMS  *BOWEL PROBLEMS  UNUSUAL RASH Items with * indicate a potential emergency and should be followed up as soon as possible.  Feel free to call the clinic should you have any questions or concerns. The clinic phone number is [phone].  Please show the [place] at check-in to the Emergency Department and triage nurse.

## 2016-08-28 NOTE — Telephone Encounter (Signed)
Gave patient AVS and calender per 6/4 LOS.

## 2016-08-30 ENCOUNTER — Encounter: Payer: Self-pay | Admitting: Hematology and Oncology

## 2016-08-30 NOTE — Assessment & Plan Note (Signed)
This is likely anemia of chronic disease. The patient denies recent history of bleeding such as epistaxis, hematuria or hematochezia. He is asymptomatic from the anemia. We will observe for now.  

## 2016-08-30 NOTE — Assessment & Plan Note (Signed)
The elevated total bilirubin is likely due to Gilbert's syndrome. This has been present for long time and he is not symptomatic. Recommend close observation only

## 2016-08-30 NOTE — Assessment & Plan Note (Signed)
I reviewed recent PET CT scan with the patient. He has near complete response to treatment. We will continue for total 6 cycles of treatment. He will take acyclovir for antiviral prophylaxis So far, he tolerated treatment very well with no side effects.

## 2016-08-30 NOTE — Progress Notes (Signed)
[facility] OFFICE PROGRESS NOTE  Patient Care Team: Lajean Manes, MD as PCP - General (Internal Medicine)  SUMMARY OF ONCOLOGIC HISTORY:   CLL (chronic lymphocytic leukemia) ([place])   10/28/2015 Pathology Results    Flow cytometry confirmed CLL      10/28/2015 Tumor Marker    FISH panel confirmed del 13 q      05/24/2016 PET scan    There is a mildly prominent AP window lymph node and numerous small axillary lymph nodes, but these are not hypermetabolic beyond the mediastinal blood pool activity. 2. Mild splenomegaly, without abnormal splenic activity. 3. Other imaging findings of potential clinical significance: Chronic left frontal and right maxillary sinusitis. Coronary, aortic arch, and branch vessel atherosclerotic vascular disease. Aortoiliac atherosclerotic vascular disease. Mildly prominent prostate gland. Right scrotal hydrocele. Spondylosis, with postoperative findings in the lumbar spine. Degenerative arthropathy of both hips.      06/05/2016 -  Chemotherapy    He was started on Gazyva      08/25/2016 PET scan    1. The AP window lymph node is slightly smaller at 1.1 cm, and remains of similar activity to background blood pool. 2. Prior splenomegaly has resolved. Normal splenic activity. 3. No current active hypermetabolic activity to suggest active malignancy. 4. Other imaging findings of potential clinical significance: Aortic Atherosclerosis (ICD10-I70.0). Coronary atherosclerosis. Prominent stool throughout the colon favors constipation. Prostatomegaly. Right scrotal hydrocele. Mild chronic left frontal sinusitis. Biapical pleuroparenchymal scarring.       INTERVAL HISTORY: Please see below for problem oriented charting. He returns for further follow-up. He tolerated treatment well without recent side effects. No recent nausea or vomiting. No recent infection. No new lymphadenopathy.  REVIEW OF SYSTEMS:   Constitutional: Denies fevers, chills or  abnormal weight loss Eyes: Denies blurriness of vision Ears, nose, mouth, throat, and face: Denies mucositis or sore throat Respiratory: Denies cough, dyspnea or wheezes Cardiovascular: Denies palpitation, chest discomfort or lower extremity swelling Gastrointestinal:  Denies nausea, heartburn or change in bowel habits Skin: Denies abnormal skin rashes Lymphatics: Denies new lymphadenopathy or easy bruising Neurological:Denies numbness, tingling or new weaknesses Behavioral/Psych: Mood is stable, no new changes  All other systems were reviewed with the patient and are negative.  I have reviewed the past medical history, past surgical history, social history and family history with the patient and they are unchanged from previous note.  ALLERGIES:  is allergic to sulfa antibiotics.  MEDICATIONS:  Current Outpatient Prescriptions  Medication Sig Dispense Refill  . acyclovir (ZOVIRAX) 400 MG tablet Take 1 tablet (400 mg total) by mouth daily. 30 tablet 5  . aspirin 81 MG tablet Take 81 mg by mouth daily.    Marland Kitchen escitalopram (LEXAPRO) 20 MG tablet Take 20 mg by mouth daily.    . finasteride (PROSCAR) 5 MG tablet Take 5 mg by mouth daily.    . Multiple Vitamins-Minerals (MULTIVITAMIN WITH MINERALS) tablet Take 1 tablet by mouth daily.    . polyethylene glycol (MIRALAX / GLYCOLAX) packet Take 17 g by mouth 2 (two) times daily.    . temazepam (RESTORIL) 30 MG capsule Take 30 mg by mouth at bedtime as needed for sleep. Patient takes 15 mg    . VITAMIN B1-B12 IM Inject into the muscle every 30 (thirty) days.    Marland Kitchen LORazepam (ATIVAN) 0.5 MG tablet     . ondansetron (ZOFRAN) 8 MG tablet Take 1 tablet (8 mg total) by mouth every 8 (eight) hours as needed for nausea. (Patient  not taking: Reported on 07/03/2016) 30 tablet 3   No current facility-administered medications for this visit.     PHYSICAL EXAMINATION: ECOG PERFORMANCE STATUS: 0 - Asymptomatic  Vitals:   08/28/16 0813  BP: 123/67  Pulse:  91  Resp: 20  Temp: 98.6 F (37 C)   Filed Weights   08/28/16 0813  Weight: 155 lb 6.4 oz (70.5 kg)    GENERAL:alert, no distress and comfortable SKIN: skin color, texture, turgor are normal, no rashes or significant lesions EYES: normal, Conjunctiva are pink and non-injected, sclera clear OROPHARYNX:no exudate, no erythema and lips, buccal mucosa, and tongue normal  NECK: supple, thyroid normal size, non-tender, without nodularity LYMPH:  no palpable lymphadenopathy in the cervical, axillary or inguinal LUNGS: clear to auscultation and percussion with normal breathing effort HEART: regular rate & rhythm and no murmurs and no lower extremity edema ABDOMEN:abdomen soft, non-tender and normal bowel sounds Musculoskeletal:no cyanosis of digits and no clubbing  NEURO: alert & oriented x 3 with fluent speech, no focal motor/sensory deficits  LABORATORY DATA:  I have reviewed the data as listed    Component Value Date/Time   NA 139 08/25/2016 0812   K 4.1 08/25/2016 0812   CL 99 04/06/2010 1045   CO2 29 08/25/2016 0812   GLUCOSE 98 08/25/2016 0812   BUN 17.4 08/25/2016 0812   CREATININE 1.1 08/25/2016 0812   CALCIUM 9.2 08/25/2016 0812   PROT 6.7 08/25/2016 0812   ALBUMIN 4.0 08/25/2016 0812   AST 29 08/25/2016 0812   ALT 17 08/25/2016 0812   ALKPHOS 59 08/25/2016 0812   BILITOT 2.07 (H) 08/25/2016 0812   GFRNONAA >60 04/06/2010 1045   GFRAA  04/06/2010 1045    >60        The eGFR has been calculated using the MDRD equation. This calculation has not been validated in all clinical situations. eGFR's persistently <60 mL/min signify possible Chronic Kidney Disease.    No results found for: SPEP, UPEP  Lab Results  Component Value Date   WBC 5.9 08/25/2016   NEUTROABS 3.4 08/25/2016   HGB 12.8 (L) 08/25/2016   HCT 38.8 08/25/2016   MCV 91.5 08/25/2016   PLT 208 08/25/2016      Chemistry      Component Value Date/Time   NA 139 08/25/2016 0812   K 4.1  08/25/2016 0812   CL 99 04/06/2010 1045   CO2 29 08/25/2016 0812   BUN 17.4 08/25/2016 0812   CREATININE 1.1 08/25/2016 0812      Component Value Date/Time   CALCIUM 9.2 08/25/2016 0812   ALKPHOS 59 08/25/2016 0812   AST 29 08/25/2016 0812   ALT 17 08/25/2016 0812   BILITOT 2.07 (H) 08/25/2016 6384       RADIOGRAPHIC STUDIES: I have personally reviewed the radiological images as listed and agreed with the findings in the report. Nm Pet Image Restag (ps) Skull Base To Thigh  Result Date: 08/25/2016 CLINICAL DATA:  Subsequent treatment strategy for chronic lymphocytic leukemia. EXAM: NUCLEAR MEDICINE PET SKULL BASE TO THIGH TECHNIQUE: 7.7 mCi F-18 FDG was injected intravenously. Full-ring PET imaging was performed from the skull base to thigh after the radiotracer. CT data was obtained and used for attenuation correction and anatomic localization. FASTING BLOOD GLUCOSE:  Value: 102 mg/dl COMPARISON:  05/24/2016 FINDINGS: NECK No hypermetabolic lymph nodes in the neck. Mild chronic left frontal sinusitis. CHEST AP window lymph node 1.1 cm in short axis on image 74/4, previously 1.3 cm. No hypermetabolic  activity beyond the background blood pool in this or other lymph nodes in the mediastinum. There scattered small right axillary lymph nodes which are not hypermetabolic Coronary, aortic arch, and branch vessel atherosclerotic vascular disease. Biapical pleuroparenchymal scarring. Stable mild dependent interstitial accentuation probably from mild atelectasis. No worrisome pulmonary nodules. ABDOMEN/PELVIS Several small hypodense hepatic lesions are stable and not hypermetabolic. The spleen measures 10.8 by 4.4 by 10.0 cm (volume = 250 cm^3). There is some mild physiologic activity in bowel no hypermetabolic activity suggestive of malignancy in the liver, spleen, pancreas, or kidneys. No hypermetabolic adenopathy in the abdomen or pelvis. Prominent stool throughout the colon favors constipation.  Aortoiliac atherosclerotic vascular disease. Prostatomegaly. Right scrotal hydrocele. SKELETON Bridging fusion of the right SI joint. Considerable degenerative hip arthropathy bilaterally. Postoperative findings in the lower lumbar spine. No significant abnormal hypermetabolic bony activity is identified. IMPRESSION: 1. The AP window lymph node is slightly smaller at 1.1 cm, and remains of similar activity to background blood pool. 2. Prior splenomegaly has resolved.  Normal splenic activity. 3. No current active hypermetabolic activity to suggest active malignancy. 4. Other imaging findings of potential clinical significance: Aortic Atherosclerosis (ICD10-I70.0). Coronary atherosclerosis. Prominent stool throughout the colon favors constipation. Prostatomegaly. Right scrotal hydrocele. Mild chronic left frontal sinusitis. Biapical pleuroparenchymal scarring. Electronically Signed   By: Van Clines M.D.   On: 08/25/2016 12:40    ASSESSMENT & PLAN:  CLL (chronic lymphocytic leukemia) ([place]) I reviewed recent PET CT scan with the patient. He has near complete response to treatment. We will continue for total 6 cycles of treatment. He will take acyclovir for antiviral prophylaxis So far, he tolerated treatment very well with no side effects.  Anemia due to chronic illness This is likely anemia of chronic disease. The patient denies recent history of bleeding such as epistaxis, hematuria or hematochezia. He is asymptomatic from the anemia. We will observe for now.   Gilbert's syndrome The elevated total bilirubin is likely due to Gilbert's syndrome. This has been present for long time and he is not symptomatic. Recommend close observation only   No orders of the defined types were placed in this encounter.  All questions were answered. The patient knows to call the clinic with any problems, questions or concerns. No barriers to learning was detected. I spent 15 minutes counseling the patient  face to face. The total time spent in the appointment was 20 minutes and more than 50% was on counseling and review of test results     [name], MD 08/30/2016 7:02 AM

## 2016-09-25 ENCOUNTER — Other Ambulatory Visit (HOSPITAL_BASED_OUTPATIENT_CLINIC_OR_DEPARTMENT_OTHER): Payer: Medicare Other

## 2016-09-25 ENCOUNTER — Ambulatory Visit (HOSPITAL_BASED_OUTPATIENT_CLINIC_OR_DEPARTMENT_OTHER): Payer: Medicare Other

## 2016-09-25 VITALS — BP 149/70 | HR 86 | Temp 98.1°F | Resp 17

## 2016-09-25 DIAGNOSIS — C9110 Chronic lymphocytic leukemia of B-cell type not having achieved remission: Secondary | ICD-10-CM

## 2016-09-25 DIAGNOSIS — Z5112 Encounter for antineoplastic immunotherapy: Secondary | ICD-10-CM

## 2016-09-25 DIAGNOSIS — C8300 Small cell B-cell lymphoma, unspecified site: Secondary | ICD-10-CM

## 2016-09-25 LAB — CBC WITH DIFFERENTIAL/PLATELET
BASO%: 1.4 % (ref 0.0–2.0)
Basophils Absolute: 0.1 10*3/uL (ref 0.0–0.1)
EOS ABS: 0.1 10*3/uL (ref 0.0–0.5)
EOS%: 1.5 % (ref 0.0–7.0)
HCT: 37.6 % — ABNORMAL LOW (ref 38.4–49.9)
HGB: 12.7 g/dL — ABNORMAL LOW (ref 13.0–17.1)
LYMPH#: 1.6 10*3/uL (ref 0.9–3.3)
LYMPH%: 25.6 % (ref 14.0–49.0)
MCH: 30.1 pg (ref 27.2–33.4)
MCHC: 33.9 g/dL (ref 32.0–36.0)
MCV: 88.9 fL (ref 79.3–98.0)
MONO#: 0.6 10*3/uL (ref 0.1–0.9)
MONO%: 9.8 % (ref 0.0–14.0)
NEUT%: 61.7 % (ref 39.0–75.0)
NEUTROS ABS: 4 10*3/uL (ref 1.5–6.5)
PLATELETS: 253 10*3/uL (ref 140–400)
RBC: 4.23 10*6/uL (ref 4.20–5.82)
RDW: 13.6 % (ref 11.0–14.6)
WBC: 6.4 10*3/uL (ref 4.0–10.3)

## 2016-09-25 LAB — COMPREHENSIVE METABOLIC PANEL
ALBUMIN: 3.9 g/dL (ref 3.5–5.0)
ALK PHOS: 55 U/L (ref 40–150)
ALT: 21 U/L (ref 0–55)
AST: 35 U/L — AB (ref 5–34)
Anion Gap: 10 mEq/L (ref 3–11)
BUN: 17.9 mg/dL (ref 7.0–26.0)
CHLORIDE: 101 meq/L (ref 98–109)
CO2: 26 mEq/L (ref 22–29)
Calcium: 9 mg/dL (ref 8.4–10.4)
Creatinine: 1 mg/dL (ref 0.7–1.3)
EGFR: 68 mL/min/{1.73_m2} — AB (ref >90)
GLUCOSE: 109 mg/dL (ref 70–140)
Potassium: 3.8 mEq/L (ref 3.5–5.1)
SODIUM: 136 meq/L (ref 136–145)
Total Bilirubin: 1.76 mg/dL — ABNORMAL HIGH (ref 0.20–1.20)
Total Protein: 6.6 g/dL (ref 6.4–8.3)

## 2016-09-25 MED ORDER — ACETAMINOPHEN 325 MG PO TABS
650.0000 mg | ORAL_TABLET | Freq: Once | ORAL | Status: AC
  Administered 2016-09-25: 650 mg via ORAL

## 2016-09-25 MED ORDER — SODIUM CHLORIDE 0.9 % IV SOLN
20.0000 mg | Freq: Once | INTRAVENOUS | Status: AC
  Administered 2016-09-25: 20 mg via INTRAVENOUS
  Filled 2016-09-25: qty 2

## 2016-09-25 MED ORDER — SODIUM CHLORIDE 0.9 % IV SOLN
1000.0000 mg | Freq: Once | INTRAVENOUS | Status: AC
  Administered 2016-09-25: 1000 mg via INTRAVENOUS
  Filled 2016-09-25: qty 40

## 2016-09-25 MED ORDER — SODIUM CHLORIDE 0.9 % IV SOLN
Freq: Once | INTRAVENOUS | Status: AC
Start: 2016-09-25 — End: 2016-09-25
  Administered 2016-09-25: 10:00:00 via INTRAVENOUS

## 2016-09-25 MED ORDER — DIPHENHYDRAMINE HCL 50 MG/ML IJ SOLN
50.0000 mg | Freq: Once | INTRAMUSCULAR | Status: AC
  Administered 2016-09-25: 50 mg via INTRAVENOUS

## 2016-09-25 MED ORDER — DIPHENHYDRAMINE HCL 50 MG/ML IJ SOLN
INTRAMUSCULAR | Status: AC
  Filled 2016-09-25: qty 1

## 2016-09-25 MED ORDER — ACETAMINOPHEN 325 MG PO TABS
ORAL_TABLET | ORAL | Status: AC
  Filled 2016-09-25: qty 2

## 2016-09-28 DIAGNOSIS — F411 Generalized anxiety disorder: Secondary | ICD-10-CM | POA: Diagnosis not present

## 2016-10-04 DIAGNOSIS — N359 Urethral stricture, unspecified: Secondary | ICD-10-CM | POA: Diagnosis not present

## 2016-10-04 DIAGNOSIS — R338 Other retention of urine: Secondary | ICD-10-CM | POA: Diagnosis not present

## 2016-10-04 DIAGNOSIS — N401 Enlarged prostate with lower urinary tract symptoms: Secondary | ICD-10-CM | POA: Diagnosis not present

## 2016-10-12 DIAGNOSIS — H2513 Age-related nuclear cataract, bilateral: Secondary | ICD-10-CM | POA: Diagnosis not present

## 2016-10-19 ENCOUNTER — Ambulatory Visit (HOSPITAL_BASED_OUTPATIENT_CLINIC_OR_DEPARTMENT_OTHER): Payer: Medicare Other | Admitting: Hematology and Oncology

## 2016-10-19 ENCOUNTER — Other Ambulatory Visit: Payer: Self-pay | Admitting: Hematology and Oncology

## 2016-10-19 ENCOUNTER — Other Ambulatory Visit (HOSPITAL_BASED_OUTPATIENT_CLINIC_OR_DEPARTMENT_OTHER): Payer: Medicare Other

## 2016-10-19 ENCOUNTER — Telehealth: Payer: Self-pay | Admitting: Hematology and Oncology

## 2016-10-19 DIAGNOSIS — D638 Anemia in other chronic diseases classified elsewhere: Secondary | ICD-10-CM | POA: Diagnosis not present

## 2016-10-19 DIAGNOSIS — C9110 Chronic lymphocytic leukemia of B-cell type not having achieved remission: Secondary | ICD-10-CM

## 2016-10-19 LAB — COMPREHENSIVE METABOLIC PANEL
ALT: 15 U/L (ref 0–55)
ANION GAP: 8 meq/L (ref 3–11)
AST: 30 U/L (ref 5–34)
Albumin: 3.9 g/dL (ref 3.5–5.0)
Alkaline Phosphatase: 57 U/L (ref 40–150)
BUN: 18.4 mg/dL (ref 7.0–26.0)
CHLORIDE: 100 meq/L (ref 98–109)
CO2: 28 meq/L (ref 22–29)
Calcium: 8.8 mg/dL (ref 8.4–10.4)
Creatinine: 1.1 mg/dL (ref 0.7–1.3)
EGFR: 59 mL/min/{1.73_m2} — AB (ref >90)
GLUCOSE: 93 mg/dL (ref 70–140)
POTASSIUM: 4.1 meq/L (ref 3.5–5.1)
SODIUM: 136 meq/L (ref 136–145)
Total Bilirubin: 2.05 mg/dL — ABNORMAL HIGH (ref 0.20–1.20)
Total Protein: 6.7 g/dL (ref 6.4–8.3)

## 2016-10-19 LAB — CBC WITH DIFFERENTIAL/PLATELET
BASO%: 0.7 % (ref 0.0–2.0)
BASOS ABS: 0.1 10*3/uL (ref 0.0–0.1)
EOS ABS: 0.1 10*3/uL (ref 0.0–0.5)
EOS%: 1 % (ref 0.0–7.0)
HEMATOCRIT: 36.7 % — AB (ref 38.4–49.9)
HEMOGLOBIN: 12.1 g/dL — AB (ref 13.0–17.1)
LYMPH#: 2 10*3/uL (ref 0.9–3.3)
LYMPH%: 19.1 % (ref 14.0–49.0)
MCH: 29.4 pg (ref 27.2–33.4)
MCHC: 33 g/dL (ref 32.0–36.0)
MCV: 89.3 fL (ref 79.3–98.0)
MONO#: 1.1 10*3/uL — AB (ref 0.1–0.9)
MONO%: 10.1 % (ref 0.0–14.0)
NEUT#: 7.4 10*3/uL — ABNORMAL HIGH (ref 1.5–6.5)
NEUT%: 69.1 % (ref 39.0–75.0)
PLATELETS: 223 10*3/uL (ref 140–400)
RBC: 4.11 10*6/uL — ABNORMAL LOW (ref 4.20–5.82)
RDW: 13.5 % (ref 11.0–14.6)
WBC: 10.7 10*3/uL — ABNORMAL HIGH (ref 4.0–10.3)

## 2016-10-19 NOTE — Telephone Encounter (Signed)
Gave patient avs report and appointments for July and October.

## 2016-10-20 ENCOUNTER — Encounter: Payer: Self-pay | Admitting: Hematology and Oncology

## 2016-10-20 NOTE — Assessment & Plan Note (Signed)
He has responded very well to treatment He is asymptomatic with no side effects from treatment We will proceed with treatment as scheduled next week I plan to see him back in 3 months with repeat history, physical examination and blood work

## 2016-10-20 NOTE — Assessment & Plan Note (Signed)
The elevated total bilirubin is likely due to Gilbert's syndrome. This has been present for long time and he is not symptomatic. Recommend close observation only

## 2016-10-20 NOTE — Progress Notes (Signed)
[facility] OFFICE PROGRESS NOTE  Patient Care Team: Lajean Manes, MD as PCP - General (Internal Medicine)  SUMMARY OF ONCOLOGIC HISTORY:   CLL (chronic lymphocytic leukemia) ([place])   10/28/2015 Pathology Results    Flow cytometry confirmed CLL      10/28/2015 Tumor Marker    FISH panel confirmed del 13 q      05/24/2016 PET scan    There is a mildly prominent AP window lymph node and numerous small axillary lymph nodes, but these are not hypermetabolic beyond the mediastinal blood pool activity. 2. Mild splenomegaly, without abnormal splenic activity. 3. Other imaging findings of potential clinical significance: Chronic left frontal and right maxillary sinusitis. Coronary, aortic arch, and branch vessel atherosclerotic vascular disease. Aortoiliac atherosclerotic vascular disease. Mildly prominent prostate gland. Right scrotal hydrocele. Spondylosis, with postoperative findings in the lumbar spine. Degenerative arthropathy of both hips.      06/05/2016 -  Chemotherapy    He was started on Gazyva      08/25/2016 PET scan    1. The AP window lymph node is slightly smaller at 1.1 cm, and remains of similar activity to background blood pool. 2. Prior splenomegaly has resolved. Normal splenic activity. 3. No current active hypermetabolic activity to suggest active malignancy. 4. Other imaging findings of potential clinical significance: Aortic Atherosclerosis (ICD10-I70.0). Coronary atherosclerosis. Prominent stool throughout the colon favors constipation. Prostatomegaly. Right scrotal hydrocele. Mild chronic left frontal sinusitis. Biapical pleuroparenchymal scarring.       INTERVAL HISTORY: Please see below for problem oriented charting. He returns to be seen prior to his last cycle of treatment He feels well He is saddened that his wife passed away recently It was a peaceful death He is coping well He denies recent infection, new lymphadenopathy, abnormal weight loss  or night sweats He tolerated treatment well without any side effects  REVIEW OF SYSTEMS:   Constitutional: Denies fevers, chills or abnormal weight loss Eyes: Denies blurriness of vision Ears, nose, mouth, throat, and face: Denies mucositis or sore throat Respiratory: Denies cough, dyspnea or wheezes Cardiovascular: Denies palpitation, chest discomfort or lower extremity swelling Gastrointestinal:  Denies nausea, heartburn or change in bowel habits Skin: Denies abnormal skin rashes Lymphatics: Denies new lymphadenopathy or easy bruising Neurological:Denies numbness, tingling or new weaknesses Behavioral/Psych: Mood is stable, no new changes  All other systems were reviewed with the patient and are negative.  I have reviewed the past medical history, past surgical history, social history and family history with the patient and they are unchanged from previous note.  ALLERGIES:  is allergic to sulfa antibiotics.  MEDICATIONS:  Current Outpatient Prescriptions  Medication Sig Dispense Refill  . acyclovir (ZOVIRAX) 400 MG tablet Take 1 tablet (400 mg total) by mouth daily. 30 tablet 5  . aspirin 81 MG tablet Take 81 mg by mouth daily.    Marland Kitchen escitalopram (LEXAPRO) 20 MG tablet Take 20 mg by mouth daily.    . finasteride (PROSCAR) 5 MG tablet Take 5 mg by mouth daily.    Marland Kitchen LORazepam (ATIVAN) 0.5 MG tablet     . Multiple Vitamins-Minerals (MULTIVITAMIN WITH MINERALS) tablet Take 1 tablet by mouth daily.    . ondansetron (ZOFRAN) 8 MG tablet Take 1 tablet (8 mg total) by mouth every 8 (eight) hours as needed for nausea. (Patient not taking: Reported on 07/03/2016) 30 tablet 3  . polyethylene glycol (MIRALAX / GLYCOLAX) packet Take 17 g by mouth 2 (two) times daily.    Marland Kitchen  temazepam (RESTORIL) 30 MG capsule Take 30 mg by mouth at bedtime as needed for sleep. Patient takes 15 mg    . VITAMIN B1-B12 IM Inject into the muscle every 30 (thirty) days.     No current facility-administered medications  for this visit.     PHYSICAL EXAMINATION: ECOG PERFORMANCE STATUS: 0 - Asymptomatic  Vitals:   10/19/16 1246  BP: 128/65  Pulse: 87  Resp: 18  Temp: 98.1 F (36.7 C)   Filed Weights   10/19/16 1246  Weight: 154 lb 14.4 oz (70.3 kg)    GENERAL:alert, no distress and comfortable SKIN: skin color, texture, turgor are normal, no rashes or significant lesions EYES: normal, Conjunctiva are pink and non-injected, sclera clear OROPHARYNX:no exudate, no erythema and lips, buccal mucosa, and tongue normal  NECK: supple, thyroid normal size, non-tender, without nodularity LYMPH:  no palpable lymphadenopathy in the cervical, axillary or inguinal LUNGS: clear to auscultation and percussion with normal breathing effort HEART: regular rate & rhythm and no murmurs and no lower extremity edema ABDOMEN:abdomen soft, non-tender and normal bowel sounds Musculoskeletal:no cyanosis of digits and no clubbing  NEURO: alert & oriented x 3 with fluent speech, no focal motor/sensory deficits  LABORATORY DATA:  I have reviewed the data as listed    Component Value Date/Time   NA 136 10/19/2016 1233   K 4.1 10/19/2016 1233   CL 99 04/06/2010 1045   CO2 28 10/19/2016 1233   GLUCOSE 93 10/19/2016 1233   BUN 18.4 10/19/2016 1233   CREATININE 1.1 10/19/2016 1233   CALCIUM 8.8 10/19/2016 1233   PROT 6.7 10/19/2016 1233   ALBUMIN 3.9 10/19/2016 1233   AST 30 10/19/2016 1233   ALT 15 10/19/2016 1233   ALKPHOS 57 10/19/2016 1233   BILITOT 2.05 (H) 10/19/2016 1233   GFRNONAA >60 04/06/2010 1045   GFRAA  04/06/2010 1045    >60        The eGFR has been calculated using the MDRD equation. This calculation has not been validated in all clinical situations. eGFR's persistently <60 mL/min signify possible Chronic Kidney Disease.    No results found for: SPEP, UPEP  Lab Results  Component Value Date   WBC 10.7 (H) 10/19/2016   NEUTROABS 7.4 (H) 10/19/2016   HGB 12.1 (L) 10/19/2016   HCT 36.7  (L) 10/19/2016   MCV 89.3 10/19/2016   PLT 223 10/19/2016      Chemistry      Component Value Date/Time   NA 136 10/19/2016 1233   K 4.1 10/19/2016 1233   CL 99 04/06/2010 1045   CO2 28 10/19/2016 1233   BUN 18.4 10/19/2016 1233   CREATININE 1.1 10/19/2016 1233      Component Value Date/Time   CALCIUM 8.8 10/19/2016 1233   ALKPHOS 57 10/19/2016 1233   AST 30 10/19/2016 1233   ALT 15 10/19/2016 1233   BILITOT 2.05 (H) 10/19/2016 1233      ASSESSMENT & PLAN:  CLL (chronic lymphocytic leukemia) ([place]) He has responded very well to treatment He is asymptomatic with no side effects from treatment We will proceed with treatment as scheduled next week I plan to see him back in 3 months with repeat history, physical examination and blood work  Gilbert's syndrome The elevated total bilirubin is likely due to Gilbert's syndrome. This has been present for long time and he is not symptomatic. Recommend close observation only  Anemia due to chronic illness This is likely anemia of chronic disease. The patient  denies recent history of bleeding such as epistaxis, hematuria or hematochezia. He is asymptomatic from the anemia. We will observe for now.    No orders of the defined types were placed in this encounter.  All questions were answered. The patient knows to call the clinic with any problems, questions or concerns. No barriers to learning was detected. I spent 15 minutes counseling the patient face to face. The total time spent in the appointment was 20 minutes and more than 50% was on counseling and review of test results     [name], MD 10/20/2016 8:47 AM

## 2016-10-20 NOTE — Assessment & Plan Note (Signed)
This is likely anemia of chronic disease. The patient denies recent history of bleeding such as epistaxis, hematuria or hematochezia. He is asymptomatic from the anemia. We will observe for now.  

## 2016-10-23 ENCOUNTER — Ambulatory Visit (HOSPITAL_BASED_OUTPATIENT_CLINIC_OR_DEPARTMENT_OTHER): Payer: Medicare Other

## 2016-10-23 VITALS — BP 141/75 | HR 71 | Temp 98.8°F | Resp 18

## 2016-10-23 DIAGNOSIS — C8300 Small cell B-cell lymphoma, unspecified site: Secondary | ICD-10-CM

## 2016-10-23 DIAGNOSIS — C9110 Chronic lymphocytic leukemia of B-cell type not having achieved remission: Secondary | ICD-10-CM | POA: Diagnosis not present

## 2016-10-23 DIAGNOSIS — Z5112 Encounter for antineoplastic immunotherapy: Secondary | ICD-10-CM

## 2016-10-23 MED ORDER — DIPHENHYDRAMINE HCL 50 MG/ML IJ SOLN
INTRAMUSCULAR | Status: AC
Start: 2016-10-23 — End: 2016-10-23
  Filled 2016-10-23: qty 1

## 2016-10-23 MED ORDER — DIPHENHYDRAMINE HCL 50 MG/ML IJ SOLN
50.0000 mg | Freq: Once | INTRAMUSCULAR | Status: AC
  Administered 2016-10-23: 50 mg via INTRAVENOUS

## 2016-10-23 MED ORDER — ACETAMINOPHEN 325 MG PO TABS
650.0000 mg | ORAL_TABLET | Freq: Once | ORAL | Status: AC
Start: 2016-10-23 — End: 2016-10-23
  Administered 2016-10-23: 650 mg via ORAL

## 2016-10-23 MED ORDER — SODIUM CHLORIDE 0.9 % IV SOLN
1000.0000 mg | Freq: Once | INTRAVENOUS | Status: AC
  Administered 2016-10-23: 1000 mg via INTRAVENOUS
  Filled 2016-10-23: qty 40

## 2016-10-23 MED ORDER — SODIUM CHLORIDE 0.9 % IV SOLN
Freq: Once | INTRAVENOUS | Status: AC
  Administered 2016-10-23: 10:00:00 via INTRAVENOUS

## 2016-10-23 MED ORDER — SODIUM CHLORIDE 0.9 % IV SOLN
20.0000 mg | Freq: Once | INTRAVENOUS | Status: AC
  Administered 2016-10-23: 20 mg via INTRAVENOUS
  Filled 2016-10-23: qty 2

## 2016-10-23 MED ORDER — ACETAMINOPHEN 325 MG PO TABS
ORAL_TABLET | ORAL | Status: AC
  Filled 2016-10-23: qty 2

## 2016-10-23 NOTE — Patient Instructions (Signed)
[facility] Discharge Instructions for Patients Receiving Chemotherapy  Today you received the following chemotherapy agents: Gazyva   To help prevent nausea and vomiting after your treatment, we encourage you to take your nausea medication as directed.    If you develop nausea and vomiting that is not controlled by your nausea medication, call the clinic.   BELOW ARE SYMPTOMS THAT SHOULD BE REPORTED IMMEDIATELY:  *FEVER GREATER THAN 100.5 F  *CHILLS WITH OR WITHOUT FEVER  NAUSEA AND VOMITING THAT IS NOT CONTROLLED WITH YOUR NAUSEA MEDICATION  *UNUSUAL SHORTNESS OF BREATH  *UNUSUAL BRUISING OR BLEEDING  TENDERNESS IN MOUTH AND THROAT WITH OR WITHOUT PRESENCE OF ULCERS  *URINARY PROBLEMS  *BOWEL PROBLEMS  UNUSUAL RASH Items with * indicate a potential emergency and should be followed up as soon as possible.  Feel free to call the clinic you have any questions or concerns. The clinic phone number is [phone].  Please show the CHEMO ALERT CARD at check-in to the Emergency Department and triage nurse.   

## 2016-10-31 ENCOUNTER — Other Ambulatory Visit: Payer: Self-pay | Admitting: Geriatric Medicine

## 2016-10-31 DIAGNOSIS — F325 Major depressive disorder, single episode, in full remission: Secondary | ICD-10-CM | POA: Diagnosis not present

## 2016-10-31 DIAGNOSIS — Z1389 Encounter for screening for other disorder: Secondary | ICD-10-CM | POA: Diagnosis not present

## 2016-10-31 DIAGNOSIS — I10 Essential (primary) hypertension: Secondary | ICD-10-CM | POA: Diagnosis not present

## 2016-10-31 DIAGNOSIS — E041 Nontoxic single thyroid nodule: Secondary | ICD-10-CM | POA: Diagnosis not present

## 2016-10-31 DIAGNOSIS — C9110 Chronic lymphocytic leukemia of B-cell type not having achieved remission: Secondary | ICD-10-CM | POA: Diagnosis not present

## 2016-10-31 DIAGNOSIS — Z0000 Encounter for general adult medical examination without abnormal findings: Secondary | ICD-10-CM | POA: Diagnosis not present

## 2016-11-06 ENCOUNTER — Ambulatory Visit
Admission: RE | Admit: 2016-11-06 | Discharge: 2016-11-06 | Disposition: A | Discharge status: Home / Self Care | Payer: Medicare Other | Source: Ambulatory Visit | Attending: Geriatric Medicine | Admitting: Geriatric Medicine

## 2016-11-06 DIAGNOSIS — E041 Nontoxic single thyroid nodule: Secondary | ICD-10-CM

## 2016-11-06 DIAGNOSIS — E042 Nontoxic multinodular goiter: Secondary | ICD-10-CM | POA: Diagnosis not present

## 2016-11-11 DIAGNOSIS — N390 Urinary tract infection, site not specified: Secondary | ICD-10-CM | POA: Diagnosis not present

## 2016-11-11 DIAGNOSIS — N318 Other neuromuscular dysfunction of bladder: Secondary | ICD-10-CM | POA: Diagnosis not present

## 2016-11-17 DIAGNOSIS — J309 Allergic rhinitis, unspecified: Secondary | ICD-10-CM | POA: Diagnosis not present

## 2016-11-17 DIAGNOSIS — I10 Essential (primary) hypertension: Secondary | ICD-10-CM | POA: Diagnosis not present

## 2016-11-20 DIAGNOSIS — E538 Deficiency of other specified B group vitamins: Secondary | ICD-10-CM | POA: Diagnosis not present

## 2016-11-29 DIAGNOSIS — K219 Gastro-esophageal reflux disease without esophagitis: Secondary | ICD-10-CM | POA: Diagnosis not present

## 2016-12-11 DIAGNOSIS — F325 Major depressive disorder, single episode, in full remission: Secondary | ICD-10-CM | POA: Diagnosis not present

## 2016-12-11 DIAGNOSIS — K219 Gastro-esophageal reflux disease without esophagitis: Secondary | ICD-10-CM | POA: Diagnosis not present

## 2016-12-13 DIAGNOSIS — K219 Gastro-esophageal reflux disease without esophagitis: Secondary | ICD-10-CM | POA: Diagnosis not present

## 2016-12-13 DIAGNOSIS — F322 Major depressive disorder, single episode, severe without psychotic features: Secondary | ICD-10-CM | POA: Diagnosis not present

## 2016-12-13 DIAGNOSIS — R140 Abdominal distension (gaseous): Secondary | ICD-10-CM | POA: Diagnosis not present

## 2016-12-14 DIAGNOSIS — F411 Generalized anxiety disorder: Secondary | ICD-10-CM | POA: Diagnosis not present

## 2016-12-16 DIAGNOSIS — K5900 Constipation, unspecified: Secondary | ICD-10-CM | POA: Diagnosis not present

## 2016-12-18 DIAGNOSIS — R8279 Other abnormal findings on microbiological examination of urine: Secondary | ICD-10-CM | POA: Diagnosis not present

## 2016-12-18 DIAGNOSIS — R3914 Feeling of incomplete bladder emptying: Secondary | ICD-10-CM | POA: Diagnosis not present

## 2016-12-18 DIAGNOSIS — R8271 Bacteriuria: Secondary | ICD-10-CM | POA: Diagnosis not present

## 2016-12-21 DIAGNOSIS — K5900 Constipation, unspecified: Secondary | ICD-10-CM | POA: Diagnosis not present

## 2016-12-27 DIAGNOSIS — E538 Deficiency of other specified B group vitamins: Secondary | ICD-10-CM | POA: Diagnosis not present

## 2016-12-29 ENCOUNTER — Other Ambulatory Visit: Payer: Self-pay | Admitting: Geriatric Medicine

## 2016-12-29 ENCOUNTER — Ambulatory Visit
Admission: RE | Admit: 2016-12-29 | Discharge: 2016-12-29 | Disposition: A | Discharge status: Home / Self Care | Payer: Medicare Other | Source: Ambulatory Visit | Attending: Geriatric Medicine | Admitting: Geriatric Medicine

## 2016-12-29 DIAGNOSIS — R634 Abnormal weight loss: Secondary | ICD-10-CM | POA: Diagnosis not present

## 2016-12-29 DIAGNOSIS — R140 Abdominal distension (gaseous): Secondary | ICD-10-CM | POA: Diagnosis not present

## 2016-12-29 DIAGNOSIS — F322 Major depressive disorder, single episode, severe without psychotic features: Secondary | ICD-10-CM | POA: Diagnosis not present

## 2016-12-29 DIAGNOSIS — K5901 Slow transit constipation: Secondary | ICD-10-CM

## 2016-12-29 DIAGNOSIS — I10 Essential (primary) hypertension: Secondary | ICD-10-CM | POA: Diagnosis not present

## 2016-12-29 DIAGNOSIS — K5900 Constipation, unspecified: Secondary | ICD-10-CM | POA: Diagnosis not present

## 2017-01-01 DIAGNOSIS — J301 Allergic rhinitis due to pollen: Secondary | ICD-10-CM | POA: Diagnosis not present

## 2017-01-03 DIAGNOSIS — K521 Toxic gastroenteritis and colitis: Secondary | ICD-10-CM | POA: Diagnosis not present

## 2017-01-03 DIAGNOSIS — R140 Abdominal distension (gaseous): Secondary | ICD-10-CM | POA: Diagnosis not present

## 2017-01-03 DIAGNOSIS — K219 Gastro-esophageal reflux disease without esophagitis: Secondary | ICD-10-CM | POA: Diagnosis not present

## 2017-01-05 ENCOUNTER — Other Ambulatory Visit: Payer: Self-pay | Admitting: Geriatric Medicine

## 2017-01-05 ENCOUNTER — Ambulatory Visit
Admission: RE | Admit: 2017-01-05 | Discharge: 2017-01-05 | Disposition: A | Discharge status: Home / Self Care | Payer: Medicare Other | Source: Ambulatory Visit | Attending: Geriatric Medicine | Admitting: Geriatric Medicine

## 2017-01-05 DIAGNOSIS — J209 Acute bronchitis, unspecified: Secondary | ICD-10-CM | POA: Diagnosis not present

## 2017-01-05 DIAGNOSIS — Z23 Encounter for immunization: Secondary | ICD-10-CM | POA: Diagnosis not present

## 2017-01-05 DIAGNOSIS — R05 Cough: Secondary | ICD-10-CM | POA: Diagnosis not present

## 2017-01-10 DIAGNOSIS — R338 Other retention of urine: Secondary | ICD-10-CM | POA: Diagnosis not present

## 2017-01-11 DIAGNOSIS — R338 Other retention of urine: Secondary | ICD-10-CM | POA: Diagnosis not present

## 2017-01-15 DIAGNOSIS — F411 Generalized anxiety disorder: Secondary | ICD-10-CM | POA: Diagnosis not present

## 2017-01-19 ENCOUNTER — Telehealth: Payer: Self-pay | Admitting: *Deleted

## 2017-01-19 DIAGNOSIS — R05 Cough: Secondary | ICD-10-CM | POA: Diagnosis not present

## 2017-01-19 NOTE — Telephone Encounter (Signed)
Pt left a message stating he has pneumonia and will not be able to make appts on Monday.

## 2017-01-21 ENCOUNTER — Encounter (HOSPITAL_COMMUNITY): Payer: Self-pay | Admitting: Emergency Medicine

## 2017-01-21 ENCOUNTER — Emergency Department (HOSPITAL_COMMUNITY)
Admission: EM | Admit: 2017-01-21 | Discharge: 2017-01-21 | Disposition: A | Discharge status: Home / Self Care | Payer: Medicare Other | Attending: Emergency Medicine | Admitting: Emergency Medicine

## 2017-01-21 DIAGNOSIS — J189 Pneumonia, unspecified organism: Secondary | ICD-10-CM | POA: Diagnosis not present

## 2017-01-21 DIAGNOSIS — R042 Hemoptysis: Secondary | ICD-10-CM | POA: Insufficient documentation

## 2017-01-21 DIAGNOSIS — K219 Gastro-esophageal reflux disease without esophagitis: Secondary | ICD-10-CM | POA: Insufficient documentation

## 2017-01-21 DIAGNOSIS — R439 Unspecified disturbances of smell and taste: Secondary | ICD-10-CM | POA: Diagnosis not present

## 2017-01-21 DIAGNOSIS — Z79899 Other long term (current) drug therapy: Secondary | ICD-10-CM | POA: Insufficient documentation

## 2017-01-21 DIAGNOSIS — F329 Major depressive disorder, single episode, unspecified: Secondary | ICD-10-CM | POA: Diagnosis not present

## 2017-01-21 DIAGNOSIS — R438 Other disturbances of smell and taste: Secondary | ICD-10-CM | POA: Diagnosis not present

## 2017-01-21 NOTE — ED Triage Notes (Signed)
Patient was diagnosed with PNA and already finished medications for it.  Patient still feeling bad and reports cough with blood in it for couple days. patietn also having issues with acid reflux and not eating as normal and c/o bad taste in mouth. Patient had appt with GI and doctor for CLL for tomorrow but cancelled them both due to not feeling well and now cant get in to see them for several weeks.

## 2017-01-21 NOTE — ED Provider Notes (Signed)
Medical screening examination/treatment/procedure(s) were conducted as a shared visit with non-physician practitioner(s) and myself.  I personally evaluated the patient during the encounter.   EKG Interpretation None     83 year old male who presents with long-standing history of GERD.  He is already on therapy for this but continues to note symptoms such as epigastric burning without cardiac concern.  He has an appointment to be seen by GI in the morning but he canceled it.  He did have some coughing spells with faint bloodstains but denied any frank hemoptysis.  He is afebrile vital signs are stable.  His pulse ox is stable.  I do not feel that he needs any emergent imaging workup at this time and his daughter will call his physicians tomorrow to reschedule his appointments and return precautions given   Lacretia Leigh, MD 01/21/17 1313

## 2017-01-21 NOTE — Discharge Instructions (Signed)
Please follow-up with your primary care provider as well as the previously scheduled specialists.  Return to the ED as needed.

## 2017-01-21 NOTE — ED Provider Notes (Signed)
[facility] DEPT Provider Note   CSN: [id number] Arrival date & time: 01/21/17  1054     History   Chief Complaint Chief Complaint  Patient presents with  . PNA  . Hemoptysis  . Gastroesophageal Reflux  . Depression    HPI Christopher Fowler. is a 83 y.o. male.  HPI   Christopher Fowler. is a 83 y.o. male, with a history of recent pneumonia, presenting to the ED with a "bad taste" and a "feeling like there's a coating" in the mouth, worsening since his wife passed away in 22-Oct-2016.    Also endorses persistent cough with blood-tinged sputum (white sputum with red streaks) since around 10/5. States the evaluation of this is not really important to him because it is not really bothering him. Was seen on 10/12, diagnosed with right lower lobe pneumonia, and treated with levaquin 500 mg once daily for five days. Patient from [facility]. Patient had follow up chest xray on 10/26 at the Facility and patient was told he still had evidence of pneumonia (read states: "Pulmonary infiltrates in medial aspect of right lung base...") . States "Dr. Felipa Eth told me that sometimes it takes the medication some time to full take effect and he didn't need me to see for another two weeks."  Endorses diarrhea 3-4 days ago after his psychiatrist added Effexor XR 75mg  daily to his antidepressant regimen of Lexapro.    Was receiving immunotherapy for CLL with last treatment about three months ago.  Had appointments with his GI specialist, Dr. Paulita Fujita, and his CLL oncologist, Dr. Claretha Cooper, both for tomorrow. He states he cancelled these because he didn't feel like he was going to be well enough to go.  Denies shortness of breath, chest pain, abdominal pain, fever/chills, N/V/D, or any other complaints.  Accompanied by his daughter at the bedside.   Past Medical History:  Diagnosis Date  . BPH (benign prostatic hyperplasia)   . Degenerative joint  disease (DJD) of hip    hip  . DJD (degenerative joint disease) of cervical spine   . DJD (degenerative joint disease) of knee    left  . Elevated MCV   . Gilbert's syndrome   . Hernia, inguinal    left  . Neurogenic bladder   . Pancreatic cyst    2cm negative needle aspiration  . Spermatocele    right  . 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 Other constipation 07/31/2016  . Weight loss 07/03/2016  . Hypersensitivity reaction 06/05/2016  . Goals of care, counseling/discussion 05/24/2016  . Lymphoma, small lymphocytic ([place]) 05/04/2016  . Pancytopenia, acquired ([place]) 05/04/2016  . Gilbert's syndrome 10/29/2015  . Anemia due to chronic illness 10/29/2015  . CLL (chronic lymphocytic leukemia) ([place]) 10/28/2015    Past Surgical History:  Procedure Laterality Date  . BACK SURGERY     L4 herniated disk  . EUS  2007       Home Medications    Prior to Admission medications   Medication Sig Start Date End Date Taking? Authorizing Provider  acyclovir (ZOVIRAX) 400 MG tablet Take 1 tablet (400 mg total) by mouth daily. 05/24/16   [name], MD  aspirin 81 MG tablet Take 81 mg by mouth daily.    [provider]  escitalopram (LEXAPRO) 20 MG tablet Take 20 mg by mouth daily.    [provider]  finasteride (PROSCAR)  5 MG tablet Take 5 mg by mouth daily.    [provider]  LORazepam (ATIVAN) 0.5 MG tablet  01/31/16   [provider]  Multiple Vitamins-Minerals (MULTIVITAMIN WITH MINERALS) tablet Take 1 tablet by mouth daily.    [provider]  ondansetron (ZOFRAN) 8 MG tablet Take 1 tablet (8 mg total) by mouth every 8 (eight) hours as needed for nausea. Patient not taking: Reported on 07/03/2016 05/24/16   [name], MD  polyethylene glycol (MIRALAX / GLYCOLAX) packet Take 17 g by mouth 2 (two) times daily.    [provider]  temazepam (RESTORIL) 30 MG capsule Take 30 mg  by mouth at bedtime as needed for sleep. Patient takes 15 mg    [provider]  VITAMIN B1-B12 IM Inject into the muscle every 30 (thirty) days.    [provider]    Family History Family History  Problem Relation Age of Onset  . Cancer Mother 74       breast ca  . Cancer Paternal Uncle 10       unknown    Social History Social History  Substance Use Topics  . Smoking status: Never Smoker  . Smokeless tobacco: Never Used  . Alcohol use 1.8 oz/week    3 Glasses of wine per week     Allergies   Sulfa antibiotics   Review of Systems Review of Systems  Constitutional: Negative for chills and fever.  Respiratory: Positive for cough. Negative for shortness of breath.   Cardiovascular: Negative for chest pain.  Gastrointestinal: Negative for abdominal pain, diarrhea, nausea and vomiting.       A bad taste in the mouth  Neurological: Negative for dizziness, weakness, light-headedness, numbness and headaches.  All other systems reviewed and are negative.    Physical Exam Updated Vital Signs BP 118/69 (BP Location: Left Arm)   Pulse 98   Temp 98.6 F (37 C) (Oral)   Resp 16   Ht 6' (1.829 m)   Wt 65.8 kg (145 lb)   SpO2 95%   BMI 19.67 kg/m   Physical Exam  Constitutional: He appears well-developed and well-nourished. No distress.  HENT:  Head: Normocephalic and atraumatic.  Mouth/Throat: Oropharynx is clear and moist.  Eyes: Conjunctivae are normal.  Neck: Neck supple.  Cardiovascular: Normal rate, regular rhythm, normal heart sounds and intact distal pulses.   Pulmonary/Chest: Effort normal and breath sounds normal. No respiratory distress. He exhibits no tenderness.  No increased work of breathing.  Patient speaks in full sentences without difficulty.  Abdominal: Soft. There is no tenderness. There is no guarding.  Musculoskeletal: He exhibits no edema.  Lymphadenopathy:    He has no cervical adenopathy.  Neurological: He is alert.    Skin: Skin is warm and dry. Capillary refill takes less than 2 seconds. He is not diaphoretic.  Psychiatric: He has a normal mood and affect. His behavior is normal.  Nursing note and vitals reviewed.    ED Treatments / Results  Labs (all labs ordered are listed, but only abnormal results are displayed) Labs Reviewed - No data to display  EKG  EKG Interpretation None       Radiology No results found.  Procedures Procedures (including critical care time)  Medications Ordered in ED Medications - No data to display   Initial Impression / Assessment and Plan / ED Course  I have reviewed the triage vital signs and the nursing notes.  Pertinent labs & imaging results that  were available during my care of the patient were reviewed by me and considered in my medical decision making (see chart for details).     Patient presents with a complaint of "a bad taste in the mouth."  Patient has a follow-up appointment with GI in about 2 weeks.  No chest pain or shortness of breath. Patient is nontoxic appearing, afebrile, not tachycardic, not tachypneic, not hypotensive, maintains excellent SPO2 on room air, and is in no apparent distress.  Patient already has appropriate follow-up. The patient was given instructions for home care as well as return precautions. Patient voices understanding of these instructions, accepts the plan, and is comfortable with discharge.  Findings and plan of care discussed with Lacretia Leigh, MD. Dr. Zenia Resides personally evaluated and examined this patient.  Vitals:   01/21/17 1117 01/21/17 1230 01/21/17 1300  BP: 118/69 125/60 130/64  Pulse: 98 87 86  Resp: 16 17 16   Temp: 98.6 F (37 C)    TempSrc: Oral    SpO2: 95% 98% 97%  Weight: 65.8 kg (145 lb)    Height: 6' (1.829 m)       Final Clinical Impressions(s) / ED Diagnoses   Final diagnoses:  Bad taste in mouth    New Prescriptions Discharge Medication List as of 01/21/2017  1:35 PM        Lorayne Bender, PA-C 01/23/17 1618

## 2017-01-21 NOTE — ED Notes (Signed)
ED Provider at bedside. 

## 2017-01-22 ENCOUNTER — Other Ambulatory Visit: Payer: Medicare Other

## 2017-01-22 ENCOUNTER — Telehealth: Payer: Self-pay

## 2017-01-22 ENCOUNTER — Ambulatory Visit: Payer: Medicare Other | Admitting: Hematology and Oncology

## 2017-01-22 NOTE — Telephone Encounter (Signed)
Called and left below message. Instructed to call for questions. 

## 2017-01-22 NOTE — Telephone Encounter (Signed)
Daughter called and left message regarding appt that patient canceled. Daughter would like to reschedule appt as soon as possible. She said, he is at the nursing home and going down hill fast.

## 2017-01-22 NOTE — Telephone Encounter (Signed)
I will send scheduling msg to reschedule in 2-3 weeks

## 2017-01-22 NOTE — Telephone Encounter (Signed)
I placed scheduling msg to reschedule to 3 weeks earlier today My advice to his daughter: if he is in a nursing home and not doing well, he needs to be assessed there immediately. If not, he needs to be brought to the ER I was just seeing him for a routine visit for CLL

## 2017-01-24 ENCOUNTER — Encounter: Payer: Self-pay | Admitting: Internal Medicine

## 2017-01-24 ENCOUNTER — Ambulatory Visit (INDEPENDENT_AMBULATORY_CARE_PROVIDER_SITE_OTHER): Payer: Medicare Other | Admitting: Internal Medicine

## 2017-01-24 ENCOUNTER — Other Ambulatory Visit (INDEPENDENT_AMBULATORY_CARE_PROVIDER_SITE_OTHER): Payer: Medicare Other

## 2017-01-24 VITALS — BP 96/54 | HR 107 | Ht 72.0 in | Wt 150.2 lb

## 2017-01-24 DIAGNOSIS — E46 Unspecified protein-calorie malnutrition: Secondary | ICD-10-CM | POA: Diagnosis not present

## 2017-01-24 DIAGNOSIS — K219 Gastro-esophageal reflux disease without esophagitis: Secondary | ICD-10-CM

## 2017-01-24 DIAGNOSIS — R634 Abnormal weight loss: Secondary | ICD-10-CM | POA: Diagnosis not present

## 2017-01-24 DIAGNOSIS — F4321 Adjustment disorder with depressed mood: Secondary | ICD-10-CM | POA: Diagnosis not present

## 2017-01-24 DIAGNOSIS — R630 Anorexia: Secondary | ICD-10-CM | POA: Diagnosis not present

## 2017-01-24 LAB — COMPREHENSIVE METABOLIC PANEL
ALBUMIN: 3.3 g/dL — AB (ref 3.5–5.2)
ALT: 41 U/L (ref 0–53)
AST: 47 U/L — AB (ref 0–37)
Alkaline Phosphatase: 86 U/L (ref 39–117)
BILIRUBIN TOTAL: 1.1 mg/dL (ref 0.2–1.2)
BUN: 20 mg/dL (ref 6–23)
CALCIUM: 8.6 mg/dL (ref 8.4–10.5)
CO2: 32 mEq/L (ref 19–32)
CREATININE: 0.73 mg/dL (ref 0.40–1.50)
Chloride: 84 mEq/L — ABNORMAL LOW (ref 96–112)
GFR: 107.21 mL/min (ref >60.00)
Glucose, Bld: 108 mg/dL — ABNORMAL HIGH (ref 70–99)
Potassium: 3.8 mEq/L (ref 3.5–5.1)
SODIUM: 122 meq/L — AB (ref 135–145)
Total Protein: 6.4 g/dL (ref 6.0–8.3)

## 2017-01-24 LAB — CBC WITH DIFFERENTIAL/PLATELET
BASOS ABS: 0.2 10*3/uL — AB (ref 0.0–0.1)
BASOS PCT: 0.8 % (ref 0.0–3.0)
EOS ABS: 0 10*3/uL (ref 0.0–0.7)
Eosinophils Relative: 0 % (ref 0.0–5.0)
HEMATOCRIT: 35.1 % — AB (ref 39.0–52.0)
HEMOGLOBIN: 11.8 g/dL — AB (ref 13.0–17.0)
LYMPHS PCT: 4.4 % — AB (ref 12.0–46.0)
Lymphs Abs: 0.9 10*3/uL (ref 0.7–4.0)
MCHC: 33.6 g/dL (ref 30.0–36.0)
MCV: 87.2 fl (ref 78.0–100.0)
MONO ABS: 1.5 10*3/uL — AB (ref 0.1–1.0)
Monocytes Relative: 7.3 % (ref 3.0–12.0)
Neutro Abs: 18.4 10*3/uL — ABNORMAL HIGH (ref 1.4–7.7)
Neutrophils Relative %: 87.5 % — ABNORMAL HIGH (ref 43.0–77.0)
Platelets: 522 10*3/uL — ABNORMAL HIGH (ref 150.0–400.0)
RBC: 4.03 Mil/uL — ABNORMAL LOW (ref 4.22–5.81)
RDW: 14.3 % (ref 11.5–15.5)
WBC: 21 10*3/uL (ref 4.0–10.5)

## 2017-01-24 NOTE — Patient Instructions (Addendum)
  You have been scheduled for an endoscopy. Please follow written instructions given to you at your visit today. If you use inhalers (even only as needed), please bring them with you on the day of your procedure.   Your physician has requested that you go to the basement for the following lab work before leaving today: CBC/diff, CMET, prealbumin   I appreciate the opportunity to care for you. Silvano Rusk, MD, [facility]

## 2017-01-24 NOTE — Progress Notes (Signed)
Christopher Fowler. 83 y.o. 10-20-1924 [id number]  Assessment & Plan:   Encounter Diagnoses  Name Primary?  . Loss of weight Yes  . Gastroesophageal reflux disease, esophagitis presence not specified   . Grief reaction   . Protein-calorie malnutrition, unspecified severity ([place])   . Anorexia      Depression clearly playing  arole but has CLL, GERD - ? If something new has developed - lymphoma transformation??  EGD to evaluate his symptoms of regurgitation and heartburn in conjunction with weight loss The risks and benefits as well as alternatives of endoscopic procedure(s) have been discussed and reviewed. All questions answered. The patient agrees to proceed.  CBC, CMET prealbumin  ? If truly allergic to Remeron which should help these sxs  I will talk to his daughter per his sister-in-law's request   Communicate w/ [name] once endoscopy is done  [id number], Christopher Ha, MD [name], MD [name], MD  Subjective:   Chief Complaint: Reflux and weight loss anorexia  HPI The patient is a 83 year old recently with with a several month or more history of regurgitation anorexia and significant weight loss.  His wife died this summer, they had been married almost 71 years, and he is still grieving.  He had been treated for depression for many years with Lexapro by [name] at least lately, and now he is weaning Lexapro and starting Effexor.  He had to move from an assisted living quarters to assisted living at [facility] and is in a small room and that has been bothersome and he does not like the food.  He has little appetite if at all.  When he eats he will regurgitate and sometimes spits up bloody streaked material he thinks.  Energy level is low.  He sleeps with the aid of temazepam.  He is on omeprazole 20 mg daily and twice daily Zantac 75.  His sister-in-law is here, Christopher Fowler, and she participates in the history and he indicates they are considering having  him moved to [facility] for a different living situation.  He does not have dysphagia.  He had PET scanning for his CLL in the summer and things looked good there.  Followed by Dr. Alvy Bimler.  He has had heartburn and indigestion problems in the past but never this severe.  GI history notable for a pancreatic tail cyst which was aspirated and benign, and CT follow-up showed stability over time.  It was tiny 1.9 cm..  He was diagnosed with a right lower lobe pneumonia chest x-ray images reviewed 01/05/2017.  Indications for that chest x-ray tell as he was having some mild hemoptysis which is I think the bloody fluid or sputum or phlegm he was reporting spitting up, see above.  Do not have details of treatment, Dr. Felipa Eth treated him for this.  Also had some constipation and earlier in the month and had a abdominal film on October 5.  The Allergies  Allergen Reactions  . Cymbalta [Duloxetine Hcl]   . Remeron [Mirtazapine]   . Sulfa Antibiotics Hives   Current Meds  Medication Sig  . acyclovir (ZOVIRAX) 400 MG tablet Take 1 tablet (400 mg total) by mouth daily.  Marland Kitchen aspirin 81 MG tablet Take 81 mg by mouth daily.  Marland Kitchen escitalopram (LEXAPRO) 20 MG tablet Take 20 mg by mouth daily.  . finasteride (PROSCAR) 5 MG tablet Take 5 mg by mouth daily.  . fluticasone (FLONASE) 50 MCG/ACT nasal spray Place into both nostrils daily.  Marland Kitchen  loratadine (CLARITIN) 10 MG tablet Take 10 mg by mouth daily.  Marland Kitchen LORazepam (ATIVAN) 0.5 MG tablet   . montelukast (SINGULAIR) 10 MG tablet Take 10 mg by mouth at bedtime.  . Multiple Vitamins-Minerals (MULTIVITAMIN WITH MINERALS) tablet Take 1 tablet by mouth daily.  Marland Kitchen omeprazole (PRILOSEC) 20 MG capsule Take 20 mg by mouth daily.  . ondansetron (ZOFRAN) 8 MG tablet Take 1 tablet (8 mg total) by mouth every 8 (eight) hours as needed for nausea.  . polyethylene glycol (MIRALAX / GLYCOLAX) packet Take 17 g by mouth 2 (two) times daily.  . ranitidine (ZANTAC) 75 MG tablet Take  75 mg by mouth 2 (two) times daily.  Marland Kitchen senna (SENOKOT) 8.6 MG TABS tablet Take 1 tablet by mouth at bedtime.  . temazepam (RESTORIL) 30 MG capsule Take 30 mg by mouth at bedtime as needed for sleep. Patient takes 15 mg  . Venlafaxine HCl (EFFEXOR PO) Take 75 mg by mouth.  Marland Kitchen VITAMIN B1-B12 IM Inject into the muscle every 30 (thirty) days.   Past Medical History:  Diagnosis Date  . BPH (benign prostatic hyperplasia)   . CLL (chronic lymphocytic leukemia) ([place])   . Degenerative joint disease (DJD) of hip    hip  . DJD (degenerative joint disease) of cervical spine   . DJD (degenerative joint disease) of knee    left  . Elevated MCV   . Gilbert's syndrome   . Hernia, inguinal    left  . Neurogenic bladder   . Pancreatic cyst    2cm negative needle aspiration  . Spermatocele    right  . 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 BACK SURGERY     L4 herniated disk  . EUS  2007   Social History   Social History  . Marital status: Widowed    Spouse name: Christopher Fowler  . Number of children: 1   Occupational History  . retired    Social History Main Topics  . Smoking status: Never Smoker  . Smokeless tobacco: Never Used  . Alcohol use 1.8 oz/week    3 Glasses of wine per week  . Drug use: No  . Sexual activity: No    Social History Narrative   Widowed summer 2018   [facility], moved into assisted living summer 2018   Brother is Christopher Fowler      family history includes Breast cancer (age of onset: 27) in his mother; Cancer (age of onset: 20) in his paternal uncle.   Review of Systems As per HPI  Objective:   Physical Exam @BP  (!) 96/54   Pulse (!) 107   Ht 6' (1.829 m)   Wt 150 lb 3.2 oz (68.1 kg)   BMI 20.37 kg/m @  General:  Thin, chronically ill appearing Eyes:  anicteric. ENT:   Mouth and posterior pharynx free of lesions.  Neck:   supple w/o thyromegaly or mass.  Lungs: Clear to auscultation  bilaterally. Heart:  S1S2, no rubs, murmurs, gallops. Abdomen:  soft, non-tender, no hepatosplenomegaly,or mass and BS+.  There is a left inguinal hernia that is easily reducible Lymph:  no cervical , axillary or supraclavicular adenopathy. Extremities:   no edema, cyanosis or clubbing Skin   no rash. Neuro:  A&O x 3.  Affect is flat he is bradykinetic Psych:  appropriate mood and  Affect.   Data Reviewed: [facility] records Oncology notes, 2018 Laboratory results 2018  CT PET scan  imaging August 25, 2016 images reviewed  1. The AP window lymph node is slightly smaller at 1.1 cm, and remains of similar activity to background blood pool. 2. Prior splenomegaly has resolved.  Normal splenic activity. 3. No current active hypermetabolic activity to suggest active malignancy. 4. Other imaging findings of potential clinical significance: Aortic Atherosclerosis (ICD10-I70.0). Coronary atherosclerosis. Prominent stool throughout the colon favors constipation. Prostatomegaly. Right scrotal hydrocele. Mild chronic left frontal sinusitis. Biapical pleuroparenchymal scarring.

## 2017-01-25 ENCOUNTER — Inpatient Hospital Stay (HOSPITAL_COMMUNITY)
Admission: EM | Admit: 2017-01-25 | Discharge: 2017-01-29 | DRG: 885 | Disposition: A | Discharge status: Skilled Nursing Facility | Payer: Medicare Other | Attending: Internal Medicine | Admitting: Internal Medicine

## 2017-01-25 ENCOUNTER — Inpatient Hospital Stay (HOSPITAL_COMMUNITY): Payer: Medicare Other

## 2017-01-25 ENCOUNTER — Ambulatory Visit (AMBULATORY_SURGERY_CENTER): Payer: Medicare Other | Admitting: Internal Medicine

## 2017-01-25 ENCOUNTER — Encounter: Payer: Self-pay | Admitting: Internal Medicine

## 2017-01-25 ENCOUNTER — Telehealth: Payer: Self-pay | Admitting: Hematology and Oncology

## 2017-01-25 ENCOUNTER — Encounter (HOSPITAL_COMMUNITY): Payer: Self-pay | Admitting: Emergency Medicine

## 2017-01-25 VITALS — BP 130/70 | HR 86 | Temp 98.0°F | Resp 20 | Ht 72.0 in | Wt 150.0 lb

## 2017-01-25 DIAGNOSIS — J181 Lobar pneumonia, unspecified organism: Secondary | ICD-10-CM | POA: Diagnosis not present

## 2017-01-25 DIAGNOSIS — N319 Neuromuscular dysfunction of bladder, unspecified: Secondary | ICD-10-CM | POA: Diagnosis present

## 2017-01-25 DIAGNOSIS — E43 Unspecified severe protein-calorie malnutrition: Secondary | ICD-10-CM | POA: Diagnosis present

## 2017-01-25 DIAGNOSIS — C9111 Chronic lymphocytic leukemia of B-cell type in remission: Secondary | ICD-10-CM | POA: Diagnosis present

## 2017-01-25 DIAGNOSIS — F332 Major depressive disorder, recurrent severe without psychotic features: Secondary | ICD-10-CM | POA: Diagnosis not present

## 2017-01-25 DIAGNOSIS — Z79899 Other long term (current) drug therapy: Secondary | ICD-10-CM | POA: Diagnosis not present

## 2017-01-25 DIAGNOSIS — F4321 Adjustment disorder with depressed mood: Secondary | ICD-10-CM | POA: Diagnosis present

## 2017-01-25 DIAGNOSIS — K5909 Other constipation: Secondary | ICD-10-CM | POA: Diagnosis present

## 2017-01-25 DIAGNOSIS — J690 Pneumonitis due to inhalation of food and vomit: Secondary | ICD-10-CM | POA: Diagnosis present

## 2017-01-25 DIAGNOSIS — F419 Anxiety disorder, unspecified: Secondary | ICD-10-CM | POA: Diagnosis not present

## 2017-01-25 DIAGNOSIS — E876 Hypokalemia: Secondary | ICD-10-CM | POA: Diagnosis present

## 2017-01-25 DIAGNOSIS — Z6820 Body mass index (BMI) 20.0-20.9, adult: Secondary | ICD-10-CM

## 2017-01-25 DIAGNOSIS — C9190 Lymphoid leukemia, unspecified not having achieved remission: Secondary | ICD-10-CM | POA: Diagnosis not present

## 2017-01-25 DIAGNOSIS — R531 Weakness: Secondary | ICD-10-CM | POA: Diagnosis not present

## 2017-01-25 DIAGNOSIS — Z888 Allergy status to other drugs, medicaments and biological substances status: Secondary | ICD-10-CM | POA: Diagnosis not present

## 2017-01-25 DIAGNOSIS — J189 Pneumonia, unspecified organism: Secondary | ICD-10-CM | POA: Diagnosis present

## 2017-01-25 DIAGNOSIS — F329 Major depressive disorder, single episode, unspecified: Secondary | ICD-10-CM | POA: Diagnosis present

## 2017-01-25 DIAGNOSIS — Z7982 Long term (current) use of aspirin: Secondary | ICD-10-CM | POA: Diagnosis not present

## 2017-01-25 DIAGNOSIS — F321 Major depressive disorder, single episode, moderate: Principal | ICD-10-CM

## 2017-01-25 DIAGNOSIS — R45851 Suicidal ideations: Secondary | ICD-10-CM | POA: Diagnosis present

## 2017-01-25 DIAGNOSIS — Z66 Do not resuscitate: Secondary | ICD-10-CM | POA: Diagnosis present

## 2017-01-25 DIAGNOSIS — R4584 Anhedonia: Secondary | ICD-10-CM | POA: Diagnosis not present

## 2017-01-25 DIAGNOSIS — D638 Anemia in other chronic diseases classified elsewhere: Secondary | ICD-10-CM | POA: Diagnosis present

## 2017-01-25 DIAGNOSIS — Z8701 Personal history of pneumonia (recurrent): Secondary | ICD-10-CM | POA: Diagnosis not present

## 2017-01-25 DIAGNOSIS — E46 Unspecified protein-calorie malnutrition: Secondary | ICD-10-CM

## 2017-01-25 DIAGNOSIS — R7989 Other specified abnormal findings of blood chemistry: Secondary | ICD-10-CM | POA: Diagnosis present

## 2017-01-25 DIAGNOSIS — E860 Dehydration: Secondary | ICD-10-CM | POA: Diagnosis present

## 2017-01-25 DIAGNOSIS — D649 Anemia, unspecified: Secondary | ICD-10-CM | POA: Diagnosis not present

## 2017-01-25 DIAGNOSIS — Z882 Allergy status to sulfonamides status: Secondary | ICD-10-CM

## 2017-01-25 DIAGNOSIS — C9110 Chronic lymphocytic leukemia of B-cell type not having achieved remission: Secondary | ICD-10-CM | POA: Diagnosis not present

## 2017-01-25 DIAGNOSIS — R627 Adult failure to thrive: Secondary | ICD-10-CM | POA: Diagnosis not present

## 2017-01-25 DIAGNOSIS — R509 Fever, unspecified: Secondary | ICD-10-CM | POA: Diagnosis not present

## 2017-01-25 DIAGNOSIS — R9431 Abnormal electrocardiogram [ECG] [EKG]: Secondary | ICD-10-CM | POA: Diagnosis not present

## 2017-01-25 DIAGNOSIS — R634 Abnormal weight loss: Secondary | ICD-10-CM

## 2017-01-25 DIAGNOSIS — E440 Moderate protein-calorie malnutrition: Secondary | ICD-10-CM | POA: Diagnosis not present

## 2017-01-25 DIAGNOSIS — R450 Nervousness: Secondary | ICD-10-CM | POA: Diagnosis not present

## 2017-01-25 DIAGNOSIS — R05 Cough: Secondary | ICD-10-CM | POA: Diagnosis not present

## 2017-01-25 DIAGNOSIS — K219 Gastro-esophageal reflux disease without esophagitis: Secondary | ICD-10-CM | POA: Diagnosis present

## 2017-01-25 DIAGNOSIS — G4700 Insomnia, unspecified: Secondary | ICD-10-CM | POA: Diagnosis not present

## 2017-01-25 DIAGNOSIS — E871 Hypo-osmolality and hyponatremia: Secondary | ICD-10-CM | POA: Diagnosis not present

## 2017-01-25 DIAGNOSIS — D72829 Elevated white blood cell count, unspecified: Secondary | ICD-10-CM | POA: Diagnosis not present

## 2017-01-25 LAB — COMPREHENSIVE METABOLIC PANEL
ALBUMIN: 2.6 g/dL — AB (ref 3.5–5.0)
ALK PHOS: 85 U/L (ref 38–126)
ALT: 38 U/L (ref 17–63)
ANION GAP: 8 (ref 5–15)
AST: 46 U/L — AB (ref 15–41)
BUN: 19 mg/dL (ref 6–20)
CALCIUM: 7.9 mg/dL — AB (ref 8.9–10.3)
CO2: 29 mmol/L (ref 22–32)
Chloride: 88 mmol/L — ABNORMAL LOW (ref 101–111)
Creatinine, Ser: 0.78 mg/dL (ref 0.61–1.24)
GFR calc Af Amer: >60 mL/min (ref >60)
GFR calc non Af Amer: >60 mL/min (ref >60)
GLUCOSE: 109 mg/dL — AB (ref 65–99)
Potassium: 3.6 mmol/L (ref 3.5–5.1)
SODIUM: 125 mmol/L — AB (ref 135–145)
Total Bilirubin: 1 mg/dL (ref 0.3–1.2)
Total Protein: 5.7 g/dL — ABNORMAL LOW (ref 6.5–8.1)

## 2017-01-25 LAB — URINALYSIS, ROUTINE W REFLEX MICROSCOPIC
BILIRUBIN URINE: NEGATIVE
Bacteria, UA: NONE SEEN
GLUCOSE, UA: NEGATIVE mg/dL
KETONES UR: NEGATIVE mg/dL
LEUKOCYTES UA: NEGATIVE
NITRITE: NEGATIVE
PROTEIN: NEGATIVE mg/dL
Specific Gravity, Urine: 1.008 (ref 1.005–1.030)
Squamous Epithelial / LPF: NONE SEEN
pH: 7 (ref 5.0–8.0)

## 2017-01-25 LAB — I-STAT CHEM 8, ED
BUN: 17 mg/dL (ref 6–20)
CALCIUM ION: 1.06 mmol/L — AB (ref 1.15–1.40)
Chloride: 85 mmol/L — ABNORMAL LOW (ref 101–111)
Creatinine, Ser: 0.8 mg/dL (ref 0.61–1.24)
GLUCOSE: 104 mg/dL — AB (ref 65–99)
HCT: 29 % — ABNORMAL LOW (ref 39.0–52.0)
HEMOGLOBIN: 9.9 g/dL — AB (ref 13.0–17.0)
Potassium: 3.4 mmol/L — ABNORMAL LOW (ref 3.5–5.1)
SODIUM: 125 mmol/L — AB (ref 135–145)
TCO2: 26 mmol/L (ref 22–32)

## 2017-01-25 LAB — CBC WITH DIFFERENTIAL/PLATELET
BASOS PCT: 0 %
Basophils Absolute: 0 10*3/uL (ref 0.0–0.1)
Eosinophils Absolute: 0 10*3/uL (ref 0.0–0.7)
Eosinophils Relative: 0 %
HEMATOCRIT: 30 % — AB (ref 39.0–52.0)
Hemoglobin: 10.2 g/dL — ABNORMAL LOW (ref 13.0–17.0)
Lymphocytes Relative: 8 %
Lymphs Abs: 1.4 10*3/uL (ref 0.7–4.0)
MCH: 28.9 pg (ref 26.0–34.0)
MCHC: 34 g/dL (ref 30.0–36.0)
MCV: 85 fL (ref 78.0–100.0)
MONO ABS: 1.5 10*3/uL — AB (ref 0.1–1.0)
MONOS PCT: 8 %
NEUTROS ABS: 14.5 10*3/uL — AB (ref 1.7–7.7)
Neutrophils Relative %: 84 %
Platelets: 408 10*3/uL — ABNORMAL HIGH (ref 150–400)
RBC: 3.53 MIL/uL — ABNORMAL LOW (ref 4.22–5.81)
RDW: 13.7 % (ref 11.5–15.5)
WBC: 17.4 10*3/uL — ABNORMAL HIGH (ref 4.0–10.5)

## 2017-01-25 LAB — MRSA PCR SCREENING: MRSA BY PCR: NEGATIVE

## 2017-01-25 LAB — I-STAT CG4 LACTIC ACID, ED
LACTIC ACID, VENOUS: 1.21 mmol/L (ref 0.5–1.9)
Lactic Acid, Venous: 0.81 mmol/L (ref 0.5–1.9)

## 2017-01-25 LAB — PREALBUMIN: PREALBUMIN: 8 mg/dL — AB (ref 21–43)

## 2017-01-25 MED ORDER — POLYETHYLENE GLYCOL 3350 17 G PO PACK
17.0000 g | PACK | Freq: Two times a day (BID) | ORAL | Status: DC
  Administered 2017-01-25 – 2017-01-28 (×7): 17 g via ORAL
  Filled 2017-01-25 (×7): qty 1

## 2017-01-25 MED ORDER — BOOST PO LIQD
237.0000 mL | Freq: Two times a day (BID) | ORAL | Status: DC
  Administered 2017-01-26: 237 mL via ORAL
  Filled 2017-01-25 (×2): qty 237

## 2017-01-25 MED ORDER — FAMOTIDINE 20 MG PO TABS
10.0000 mg | ORAL_TABLET | Freq: Every day | ORAL | Status: DC
  Administered 2017-01-26: 10 mg via ORAL
  Filled 2017-01-25: qty 1

## 2017-01-25 MED ORDER — ENOXAPARIN SODIUM 40 MG/0.4ML ~~LOC~~ SOLN
40.0000 mg | SUBCUTANEOUS | Status: DC
  Administered 2017-01-26: 40 mg via SUBCUTANEOUS
  Filled 2017-01-25 (×3): qty 0.4

## 2017-01-25 MED ORDER — SODIUM CHLORIDE 0.9 % IV SOLN
3.0000 g | Freq: Three times a day (TID) | INTRAVENOUS | Status: DC
  Administered 2017-01-25 – 2017-01-26 (×2): 3 g via INTRAVENOUS
  Filled 2017-01-25 (×4): qty 3

## 2017-01-25 MED ORDER — LORATADINE 10 MG PO TABS
10.0000 mg | ORAL_TABLET | Freq: Every day | ORAL | Status: DC
  Administered 2017-01-26 – 2017-01-29 (×4): 10 mg via ORAL
  Filled 2017-01-25 (×4): qty 1

## 2017-01-25 MED ORDER — MONTELUKAST SODIUM 10 MG PO TABS
10.0000 mg | ORAL_TABLET | Freq: Every day | ORAL | Status: DC
  Administered 2017-01-25 – 2017-01-28 (×4): 10 mg via ORAL
  Filled 2017-01-25 (×4): qty 1

## 2017-01-25 MED ORDER — SENNOSIDES-DOCUSATE SODIUM 8.6-50 MG PO TABS
1.0000 | ORAL_TABLET | Freq: Two times a day (BID) | ORAL | Status: DC
  Administered 2017-01-25 – 2017-01-29 (×6): 1 via ORAL
  Filled 2017-01-25 (×8): qty 1

## 2017-01-25 MED ORDER — FLUTICASONE PROPIONATE 50 MCG/ACT NA SUSP
1.0000 | Freq: Every day | NASAL | Status: DC
  Administered 2017-01-26 – 2017-01-29 (×4): 1 via NASAL
  Filled 2017-01-25: qty 16

## 2017-01-25 MED ORDER — ASPIRIN EC 81 MG PO TBEC
81.0000 mg | DELAYED_RELEASE_TABLET | Freq: Every day | ORAL | Status: DC
  Administered 2017-01-26 – 2017-01-29 (×4): 81 mg via ORAL
  Filled 2017-01-25 (×4): qty 1

## 2017-01-25 MED ORDER — FINASTERIDE 5 MG PO TABS
5.0000 mg | ORAL_TABLET | Freq: Every day | ORAL | Status: DC
  Administered 2017-01-26 – 2017-01-29 (×4): 5 mg via ORAL
  Filled 2017-01-25 (×4): qty 1

## 2017-01-25 MED ORDER — POTASSIUM CHLORIDE CRYS ER 20 MEQ PO TBCR
40.0000 meq | EXTENDED_RELEASE_TABLET | Freq: Once | ORAL | Status: AC
  Administered 2017-01-25: 40 meq via ORAL
  Filled 2017-01-25: qty 2

## 2017-01-25 MED ORDER — SODIUM CHLORIDE 0.9 % IV SOLN
Freq: Once | INTRAVENOUS | Status: AC
  Administered 2017-01-25: 15:00:00 via INTRAVENOUS

## 2017-01-25 MED ORDER — PANTOPRAZOLE SODIUM 40 MG PO TBEC
40.0000 mg | DELAYED_RELEASE_TABLET | Freq: Every day | ORAL | Status: DC
  Administered 2017-01-26 – 2017-01-27 (×2): 40 mg via ORAL
  Filled 2017-01-25 (×2): qty 1

## 2017-01-25 MED ORDER — TEMAZEPAM 15 MG PO CAPS
30.0000 mg | ORAL_CAPSULE | Freq: Every day | ORAL | Status: DC
  Administered 2017-01-25 – 2017-01-28 (×4): 30 mg via ORAL
  Filled 2017-01-25 (×4): qty 2

## 2017-01-25 MED ORDER — SODIUM CHLORIDE 0.9 % IV SOLN
INTRAVENOUS | Status: DC
  Administered 2017-01-25 – 2017-01-27 (×4): via INTRAVENOUS

## 2017-01-25 MED ORDER — SODIUM CHLORIDE 0.9 % IV SOLN: 500.0000 mL | INTRAVENOUS | Status: DC

## 2017-01-25 NOTE — ED Triage Notes (Signed)
t from Dr Carlean Purl office where he had an upper endoscopy, per blood work pt has hyponatremia and needs fluid, received 1 L NS at the office. Pt is asymptomatic, alert and oriented x 4.

## 2017-01-25 NOTE — ED Notes (Signed)
Bed: OZ30 Expected date:  Expected time:  Means of arrival:  Comments: PCP: Abnormal lab

## 2017-01-25 NOTE — ED Notes (Addendum)
[facility] CHARGE BABLEEN REQUESTING PT NOT COME AT THIS TIME. [facility] RECEIVING ADDITIONAL PT FROM ED. CHARGE STACEY RN MADE AWARE OF REQUEST. STACEY RN AWARE OF COMMUNICATIONS WITH REMOVAL OF SITTER FOR THIS PT. STACEY RN STATES WILL REMOVE ORDER FOR SITTER. CHARGE RN [facility] AWARE OF REMOVED ORDER. PT'S COUSIN AT BEDSIDE UPDATED. SHE STATES PT DENIES SI/HI AT PRESENT. SHE STATES HIS [facility] DOCTOR CONCERN AT LAST APPT. HE WAS DEPRESSED. PT STATES HE IS GOING THROUGH A TRANSITION IN HIS LIFE. PT WILL GO TO X-RAY BEFORE TRANSFER TO [facility].

## 2017-01-25 NOTE — Telephone Encounter (Signed)
Patient is not feeling well - said he would have his daughter contact us whenever he is feeling better. Was unable to schedule 10/29 sch msg appointments.

## 2017-01-25 NOTE — Progress Notes (Signed)
Pt is resting quietly with eyes closed; arouses easily to voice and answers all questions.  No c/o pain or SOB.  Normal saline running wide open.  Family member at bedside.   Pt self catherized before discharge.  Report called to [facility] at [facility] ED

## 2017-01-25 NOTE — ED Notes (Signed)
PATIENT HAS TO SELF CATH FOR URINE COLLECTION

## 2017-01-25 NOTE — ED Provider Notes (Signed)
[facility] DEPT Provider Note   CSN: [id number] Arrival date & time: 01/25/17  1251     History   Chief Complaint Chief Complaint  Patient presents with  . hyponatremia    HPI Christopher Fowler. is a 83 y.o. male who presents to the emergency department for hyponatremia.  He has a past medical history of CLL, reflux, mild memory issues, neurogenic bladder.  Patient states that he was going for an endoscopy this morning and was sent to the emergency department because his sodium levels were too low.  He states: "I feel like I want to die."  He attributes this to the fact that his psychiatrist who has been treating his depression and anxiety for a long time switched him from Lexapro last week to Effexor XR.  He says that generally when he switches antidepressants he has about a week of feeling very badly.  He has been more depressed since June when his wife of 70 years passed away.  He had some chronic weight loss however his weight loss has accelerated over the past 3 months secondary to situational depression.  He is not any more confused than normal.  He states that since his wife died he has had more difficulty doing daily tasks like balancing his checkbook.  He lives at the friend's home.  He denies fever or chills.  He has been dealing with a cough for several weeks which he attributed to his reflux.  He has neurogenic bladder and self catheterizes but denies any abnormal urinary symptoms, lower abdominal pain or back pain.   HPI  Past Medical History:  Diagnosis Date  . BPH (benign prostatic hyperplasia)   . CLL (chronic lymphocytic leukemia) ([place])   . Degenerative joint disease (DJD) of hip    hip  . DJD (degenerative joint disease) of cervical spine   . DJD (degenerative joint disease) of knee    left  . Elevated MCV   . Gilbert's syndrome   . Hernia, inguinal    left  . Neurogenic bladder   . Pancreatic cyst    2cm negative needle aspiration   . Spermatocele    right  . 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 Hyponatremia 01/25/2017  . Other constipation 07/31/2016  . Weight loss 07/03/2016  . Hypersensitivity reaction 06/05/2016  . Goals of care, counseling/discussion 05/24/2016  . Lymphoma, small lymphocytic ([place]) 05/04/2016  . Pancytopenia, acquired ([place]) 05/04/2016  . Gilbert's syndrome 10/29/2015  . Anemia due to chronic illness 10/29/2015  . CLL (chronic lymphocytic leukemia) ([place]) 10/28/2015    Past Surgical History:  Procedure Laterality Date  . BACK SURGERY     L4 herniated disk  . COLONOSCOPY    . EUS  2007       Home Medications    Prior to Admission medications   Medication Sig Start Date End Date Taking? Authorizing Provider  acyclovir (ZOVIRAX) 400 MG tablet Take 1 tablet (400 mg total) by mouth daily. 05/24/16   [name], MD  aspirin 81 MG tablet Take 81 mg by mouth daily.    [provider]  escitalopram (LEXAPRO) 20 MG tablet Take 20 mg by mouth daily.    [provider]  finasteride (PROSCAR) 5 MG tablet Take 5 mg by mouth daily.    [provider]  fluticasone (FLONASE) 50 MCG/ACT nasal spray Place into both nostrils daily.    [provider]  loratadine (CLARITIN) 10 MG tablet Take 10 mg by mouth daily.    [provider]  LORazepam (ATIVAN) 0.5 MG tablet  01/31/16   [provider]  montelukast (SINGULAIR) 10 MG tablet Take 10 mg by mouth at bedtime.    [provider]  Multiple Vitamins-Minerals (MULTIVITAMIN WITH MINERALS) tablet Take 1 tablet by mouth daily.    [provider]  omeprazole (PRILOSEC) 20 MG capsule Take 20 mg by mouth daily.    [provider]  ondansetron (ZOFRAN) 8 MG tablet Take 1 tablet (8 mg total) by mouth every 8 (eight) hours as needed for nausea. 05/24/16   [name], MD  polyethylene glycol (MIRALAX / GLYCOLAX) packet Take 17 g  by mouth 2 (two) times daily.    [provider]  ranitidine (ZANTAC) 75 MG tablet Take 75 mg by mouth 2 (two) times daily.    [provider]  senna (SENOKOT) 8.6 MG TABS tablet Take 1 tablet by mouth at bedtime.    [provider]  temazepam (RESTORIL) 30 MG capsule Take 30 mg by mouth at bedtime as needed for sleep. Patient takes 15 mg    [provider]  Venlafaxine HCl (EFFEXOR PO) Take 75 mg by mouth.    [provider]  VITAMIN B1-B12 IM Inject into the muscle every 30 (thirty) days.    [provider]    Family History Family History  Problem Relation Age of Onset  . Breast cancer Mother 67  . Cancer Paternal Uncle 37       unknown  . Stomach cancer Neg Hx   . Colon cancer Neg Hx   . Esophageal cancer Neg Hx   . Rectal cancer Neg Hx     Social History Social History  Substance Use Topics  . Smoking status: Never Smoker  . Smokeless tobacco: Never Used  . Alcohol use 1.8 oz/week    3 Glasses of wine per week     Allergies   Cymbalta [duloxetine hcl]; Remeron [mirtazapine]; and Sulfa antibiotics   Review of Systems Review of Systems  Ten systems reviewed and are negative for acute change, except as noted in the HPI.   Physical Exam Updated Vital Signs BP (!) 147/75   Pulse 82   Temp 98 F (36.7 C) (Oral)   Resp 18   SpO2 98%   Physical Exam  Constitutional: He appears well-developed and well-nourished. No distress.  HENT:  Head: Normocephalic and atraumatic.  Eyes: Conjunctivae are normal. No scleral icterus.  Neck: Normal range of motion. Neck supple.  Cardiovascular: Normal rate, regular rhythm and normal heart sounds.   Pulmonary/Chest: Effort normal and breath sounds normal. No respiratory distress.  Crackles R lung base  Abdominal: Soft. There is no tenderness.  Musculoskeletal: He exhibits no edema.  Neurological: He is alert.  Skin: Skin is warm and dry. He is not diaphoretic.  Psychiatric:  His behavior is normal.  Nursing note and vitals reviewed.    ED Treatments / Results  Labs (all labs ordered are listed, but only abnormal results are displayed) Labs Reviewed  COMPREHENSIVE METABOLIC PANEL - Abnormal; Notable for the following:       Result Value   Sodium 125 (*)    Chloride 88 (*)    Glucose, Bld 109 (*)    Calcium 7.9 (*)    Total Protein 5.7 (*)    Albumin 2.6 (*)    AST 46 (*)  All other components within normal limits  CBC WITH DIFFERENTIAL/PLATELET - Abnormal; Notable for the following:    WBC 17.4 (*)    RBC 3.53 (*)    Hemoglobin 10.2 (*)    HCT 30.0 (*)    Platelets 408 (*)    Neutro Abs 14.5 (*)    Monocytes Absolute 1.5 (*)    All other components within normal limits  I-STAT CHEM 8, ED - Abnormal; Notable for the following:    Sodium 125 (*)    Potassium 3.4 (*)    Chloride 85 (*)    Glucose, Bld 104 (*)    Calcium, Ion 1.06 (*)    Hemoglobin 9.9 (*)    HCT 29.0 (*)    All other components within normal limits  URINALYSIS, ROUTINE W REFLEX MICROSCOPIC  I-STAT CG4 LACTIC ACID, ED  I-STAT CG4 LACTIC ACID, ED    EKG  EKG Interpretation None       Radiology No results found.  Procedures Procedures (including critical care time)  Medications Ordered in ED Medications  0.9 %  sodium chloride infusion (not administered)  senna-docusate (Senokot-S) tablet 1 tablet (not administered)     Initial Impression / Assessment and Plan / ED Course  I have reviewed the triage vital signs and the nursing notes.  Pertinent labs & imaging results that were available during my care of the patient were reviewed by me and considered in my medical decision making (see chart for details).  Clinical Course as of Jan 25 1500  Thu Jan 25, 2017  1428 Patient with hyponatremia,  His WBC is elevated likely due to CLL>  [AH]  1452 Patient will be admitted for serum sodium correction/ med review. He has no altered mental status. The xray is  pending. Previous xray 01/05/2017 showed CAP. Patient did not have complaints today, he did not mention CAP dx or abx when asked.  [AH]    Clinical Course User Index [AH] Margarita Mail, PA-C     Final Clinical Impressions(s) / ED Diagnoses   Final diagnoses:  Hyponatremia    New Prescriptions New Prescriptions   No medications on file     Margarita Mail, PA-C 01/25/17 1501    Lacretia Leigh, MD 01/29/17 [phone]

## 2017-01-25 NOTE — ED Notes (Addendum)
ED TO INPATIENT HANDOFF REPORT  Name/Age/Gender Christopher Fowler. 83 y.o. male  Code Status    Code Status Orders        Start     Ordered   01/25/17 1506  Do not attempt resuscitation (DNR)  Continuous    Question Answer Comment  In the event of cardiac or respiratory ARREST Do not call a "code blue"   In the event of cardiac or respiratory ARREST Do not perform Intubation, CPR, defibrillation or ACLS   In the event of cardiac or respiratory ARREST Use medication by any route, position, wound care, and other measures to relive pain and suffering. May use oxygen, suction and manual treatment of airway obstruction as needed for comfort.      01/25/17 1505    Code Status History    Date Active Date Inactive Code Status Order ID Comments User Context   This patient has a current code status but no historical code status.      Home/SNF/Other Nursing Home  Chief Complaint Dehydration  Level of Care/Admitting Diagnosis ED Disposition    ED Disposition Condition [facility] Area: [facility] [[id number]]  Level of Care: Med-Surg [16]  Diagnosis: Hyponatremia [045409]  Admitting Physician: Florencia Reasons [[id number]]  Attending Physician: Florencia Reasons [[id number]]  Estimated length of stay: past midnight tomorrow  Certification:: I certify this patient will need inpatient services for at least 2 midnights  PT Class (Do Not Modify): Inpatient [101]  PT Acc Code (Do Not Modify): Private [1]       Medical History Past Medical History:  Diagnosis Date  . BPH (benign prostatic hyperplasia)   . CLL (chronic lymphocytic leukemia) ([place])   . Degenerative joint disease (DJD) of hip    hip  . DJD (degenerative joint disease) of cervical spine   . DJD (degenerative joint disease) of knee    left  . Elevated MCV   . Gilbert's syndrome   . Hernia, inguinal    left  . Neurogenic bladder   . Pancreatic cyst    2cm negative needle aspiration  . Spermatocele     right  . 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 Cymbalta [Duloxetine Hcl]   . Remeron [Mirtazapine]   . Sulfa Antibiotics Hives    IV Location/Drains/Wounds Patient Lines/Drains/Airways Status   Active Line/Drains/Airways    Name:   Placement date:   Placement time:   Site:   Days:   Peripheral IV 01/25/17 Right Forearm  01/25/17    1456    Forearm    less than 1          Labs/Imaging Results for orders placed or performed during the hospital encounter of 01/25/17 (from the past 48 hour(s))  Comprehensive metabolic panel     Status: Abnormal   Collection Time: 01/25/17  1:49 PM  Result Value Ref Range   Sodium 125 (L) 135 - 145 mmol/L   Potassium 3.6 3.5 - 5.1 mmol/L   Chloride 88 (L) 101 - 111 mmol/L   CO2 29 22 - 32 mmol/L   Glucose, Bld 109 (H) 65 - 99 mg/dL   BUN 19 6 - 20 mg/dL   Creatinine, Ser 0.78 0.61 - 1.24 mg/dL   Calcium 7.9 (L) 8.9 - 10.3 mg/dL   Total Protein 5.7 (L) 6.5 - 8.1 g/dL   Albumin 2.6 (L) 3.5 - 5.0 g/dL   AST 46 (H) 15 -  41 U/L   ALT 38 17 - 63 U/L   Alkaline Phosphatase 85 38 - 126 U/L   Total Bilirubin 1.0 0.3 - 1.2 mg/dL   GFR calc non Af Amer >60 >60 mL/min   GFR calc Af Amer >60 >60 mL/min    Comment: (NOTE) The eGFR has been calculated using the CKD EPI equation. This calculation has not been validated in all clinical situations. eGFR's persistently <60 mL/min signify possible Chronic Kidney Disease.    Anion gap 8 5 - 15  CBC with Differential     Status: Abnormal   Collection Time: 01/25/17  1:49 PM  Result Value Ref Range   WBC 17.4 (H) 4.0 - 10.5 K/uL   RBC 3.53 (L) 4.22 - 5.81 MIL/uL   Hemoglobin 10.2 (L) 13.0 - 17.0 g/dL   HCT 30.0 (L) 39.0 - 52.0 %   MCV 85.0 78.0 - 100.0 fL   MCH 28.9 26.0 - 34.0 pg   MCHC 34.0 30.0 - 36.0 g/dL   RDW 13.7 11.5 - 15.5 %   Platelets 408 (H) 150 - 400 K/uL   Neutrophils Relative % 84 %   Neutro Abs 14.5 (H) 1.7 - 7.7 K/uL   Lymphocytes  Relative 8 %   Lymphs Abs 1.4 0.7 - 4.0 K/uL   Monocytes Relative 8 %   Monocytes Absolute 1.5 (H) 0.1 - 1.0 K/uL   Eosinophils Relative 0 %   Eosinophils Absolute 0.0 0.0 - 0.7 K/uL   Basophils Relative 0 %   Basophils Absolute 0.0 0.0 - 0.1 K/uL  I-Stat CG4 Lactic Acid, ED     Status: None   Collection Time: 01/25/17  2:06 PM  Result Value Ref Range   Lactic Acid, Venous 0.81 0.5 - 1.9 mmol/L  I-stat chem 8, ed     Status: Abnormal   Collection Time: 01/25/17  2:07 PM  Result Value Ref Range   Sodium 125 (L) 135 - 145 mmol/L   Potassium 3.4 (L) 3.5 - 5.1 mmol/L   Chloride 85 (L) 101 - 111 mmol/L   BUN 17 6 - 20 mg/dL   Creatinine, Ser 0.80 0.61 - 1.24 mg/dL   Glucose, Bld 104 (H) 65 - 99 mg/dL   Calcium, Ion 1.06 (L) 1.15 - 1.40 mmol/L   TCO2 26 22 - 32 mmol/L   Hemoglobin 9.9 (L) 13.0 - 17.0 g/dL   HCT 29.0 (L) 39.0 - 52.0 %  Urinalysis, Routine w reflex microscopic     Status: Abnormal   Collection Time: 01/25/17  2:21 PM  Result Value Ref Range   Color, Urine YELLOW YELLOW   APPearance CLEAR CLEAR   Specific Gravity, Urine 1.008 1.005 - 1.030   pH 7.0 5.0 - 8.0   Glucose, UA NEGATIVE NEGATIVE mg/dL   Hgb urine dipstick SMALL (A) NEGATIVE   Bilirubin Urine NEGATIVE NEGATIVE   Ketones, ur NEGATIVE NEGATIVE mg/dL   Protein, ur NEGATIVE NEGATIVE mg/dL   Nitrite NEGATIVE NEGATIVE   Leukocytes, UA NEGATIVE NEGATIVE   RBC / HPF 0-5 0 - 5 RBC/hpf   WBC, UA 0-5 0 - 5 WBC/hpf   Bacteria, UA NONE SEEN NONE SEEN   Squamous Epithelial / LPF NONE SEEN NONE SEEN  I-Stat CG4 Lactic Acid, ED     Status: None   Collection Time: 01/25/17  3:27 PM  Result Value Ref Range   Lactic Acid, Venous 1.21 0.5 - 1.9 mmol/L   No results found.  Pending Labs [employer]  None      Vitals/Pain Today's Vitals   01/25/17 1303  BP: (!) 147/75  Pulse: 82  Resp: 18  Temp: 98 F (36.7 C)  TempSrc: Oral  SpO2: 98%    Isolation Precautions No active  isolations  Medications Medications  senna-docusate (Senokot-S) tablet 1 tablet (not administered)  0.9 %  sodium chloride infusion ( Intravenous New Bag/Given 01/25/17 1513)    Mobility Walks with walker

## 2017-01-25 NOTE — Op Note (Signed)
[place] Patient Name: Christopher Fowler Procedure Date: 01/25/2017 11:38 AM MRN: [id number] Endoscopist: Gatha Mayer , MD Age: 83 Referring MD:  Date of Birth: [birth year] Gender: Male Account #: [id number] Procedure:                Upper GI endoscopy Indications:              Weight loss Medicines:                Propofol per Anesthesia, Monitored Anesthesia Care Procedure:                Pre-Anesthesia Assessment:                           - Prior to the procedure, a History and Physical                            was performed, and patient medications and                            allergies were reviewed. The patient's tolerance of                            previous anesthesia was also reviewed. The risks                            and benefits of the procedure and the sedation                            options and risks were discussed with the patient.                            All questions were answered, and informed consent                            was obtained. Prior Anticoagulants: The patient has                            taken no previous anticoagulant or antiplatelet                            agents. ASA Grade Assessment: III - A patient with                            severe systemic disease. After reviewing the risks                            and benefits, the patient was deemed in                            satisfactory condition to undergo the procedure.                           After obtaining informed consent, the endoscope was  passed under direct vision. Throughout the                            procedure, the patient's blood pressure, pulse, and                            oxygen saturations were monitored continuously. The                            Model [id number]-[id number] scope was introduced                            through the mouth, and advanced to the second part                            of duodenum.  The upper GI endoscopy was                            accomplished without difficulty. The patient                            tolerated the procedure well. Scope In: Scope Out: Findings:                 The esophagus was normal.                           The stomach was normal.                           The examined duodenum was normal. Complications:            No immediate complications. Estimated Blood Loss:     Estimated blood loss: none. Impression:               - Normal esophagus.                           - Normal stomach.                           - Normal examined duodenum.                           - No specimens collected.                           - Think profound depression is main issue.                           He is malnourished and dehydrated from labs                            yesterday. Recommendation:           - Patient has a contact number available for                            emergencies.  The signs and symptoms of potential                            delayed complications were discussed with the                            patient. Return to normal activities tomorrow.                            Written discharge instructions were provided to the                            patient.                           - Resume previous diet.                           - Continue present medications.                           - Needs admission to hospital due to dehydration,                            profound hyponatremia.                           Psych consult - may need inpatient psych admit                            after medically improved.                           Discussed w/ PCP Dr. Kizzie Fantasia, MD 01/25/2017 12:02:10 PM This report has been signed electronically.

## 2017-01-25 NOTE — ED Notes (Signed)
Patient transported to X-ray 

## 2017-01-25 NOTE — ED Notes (Signed)
Per [place], AD-patient and family deny SI at this time-staffing was notified sitter was no required at this time

## 2017-01-25 NOTE — Progress Notes (Signed)
Noticed blood in pt's urine. Urine was clear yellow earlier. Pt states that he does not normally have blood in his urine although sometimes when he caths himself, he will have small bleeding episodes. Night shift hospitalist notified. Will continue to monitor at this time.

## 2017-01-25 NOTE — Progress Notes (Signed)
IV wide open- normal saline per D.O. 1055

## 2017-01-25 NOTE — ED Notes (Signed)
Call 5E at [phone]

## 2017-01-25 NOTE — Progress Notes (Signed)
Pharmacy Antibiotic Note  Christopher Schar. is a 83 y.o. male admitted on 01/25/2017 with aspiration PNA.  Pharmacy has been consulted for Unasyn dosing.  Plan: Unasyn 3gm q8h  Height: 6' (182.9 cm) Weight: 150 lb (68 kg) IBW/kg (Calculated) : 77.6  Temp (24hrs), Avg:98.1 F (36.7 C), Min:98 F (36.7 C), Max:98.3 F (36.8 C)   Recent Labs Lab 01/24/17 1048 01/25/17 1349 01/25/17 1406 01/25/17 1407 01/25/17 1527  WBC 21.0 Repeated and verified X2.* 17.4*  --   --   --   CREATININE 0.73 0.78  --  0.80  --   LATICACIDVEN  --   --  0.81  --  1.21    Estimated Creatinine Clearance: 59 mL/min (by C-G formula based on SCr of 0.8 mg/dL).    Allergies  Allergen Reactions  . Cymbalta [Duloxetine Hcl]   . Remeron [Mirtazapine]   . Sulfa Antibiotics Hives   Antimicrobials this admission: 11/1 Unasyn >>   Dose adjustments this admission:  Microbiology results:       MRSA PCR: ordered  Thank you for allowing pharmacy to be a part of this patient's care.  Minda Ditto 01/25/2017 6:52 PM

## 2017-01-25 NOTE — H&P (Addendum)
History and Physical  Christopher Fowler. HWE:[id number] DOB: 1924-09-14 DOA: 01/25/2017  Referring physician: EDP PCP: Lajean Manes, MD   Chief Complaint: FTT, dehydration, hyponatremia, depression  HPI: Christopher Fowler. is a 83 y.o. male   With history of neurogenic bladder doing self-catheterization for several years.  History of CLL reported in remission . history of depression.  he is sent from GI office to ED due to concerning failure to thrive, dehydration, weight loss ,hyponatremia and worsening of depression.  ED course: vitals stable he denies pain.  Basic lab work showed WBC 17.4, hemoglobin 10.2, plt 408.  Sodium 124, creatinine 0.8.  UA unremarkable. hospitalist called called to admit the patient.  Per chart review patient was recently treated for pneumonia, had a chest x-ray done a few weeks ago with right lower lobe pneumonia. I have requested a EDP to obtain a repeat chest x-ray.  Review of Systems:  Detail per HPI, Review of systems are otherwise negative  Past Medical History:  Diagnosis Date  . BPH (benign prostatic hyperplasia)   . CLL (chronic lymphocytic leukemia) ([place])   . Degenerative joint disease (DJD) of hip    hip  . DJD (degenerative joint disease) of cervical spine   . DJD (degenerative joint disease) of knee    left  . Elevated MCV   . Gilbert's syndrome   . Hernia, inguinal    left  . Neurogenic bladder   . Pancreatic cyst    2cm negative needle aspiration  . Spermatocele    right  . 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 BACK SURGERY     L4 herniated disk  . COLONOSCOPY    . EUS  2007   Social History:  reports that he has never smoked. He has never used smokeless tobacco. He reports that he drinks about 1.8 oz of alcohol per week . He reports that he does not use drugs. [facility] & is able to participate in activities of daily living independently   Allergies   Allergen Reactions  . Cymbalta [Duloxetine Hcl]   . Remeron [Mirtazapine]   . Sulfa Antibiotics Hives    Family History  Problem Relation Age of Onset  . Breast cancer Mother 58  . Cancer Paternal Uncle 67       unknown  . Stomach cancer Neg Hx   . Colon cancer Neg Hx   . Esophageal cancer Neg Hx   . Rectal cancer Neg Hx       Prior to Admission medications   Medication Sig Start Date End Date Taking? Authorizing Provider  acyclovir (ZOVIRAX) 400 MG tablet Take 1 tablet (400 mg total) by mouth daily. 05/24/16   [name], MD  aspirin 81 MG tablet Take 81 mg by mouth daily.    [provider]  escitalopram (LEXAPRO) 20 MG tablet Take 20 mg by mouth daily.    [provider]  finasteride (PROSCAR) 5 MG tablet Take 5 mg by mouth daily.    [provider]  fluticasone (FLONASE) 50 MCG/ACT nasal spray Place into both nostrils daily.    [provider]  loratadine (CLARITIN) 10 MG tablet Take 10 mg by mouth daily.    [provider]  LORazepam (ATIVAN) 0.5 MG tablet  01/31/16   [provider]  montelukast (SINGULAIR) 10 MG tablet Take 10 mg by mouth at bedtime.    [provider]  Multiple Vitamins-Minerals (MULTIVITAMIN WITH MINERALS) tablet Take 1 tablet by mouth daily.    [provider]  omeprazole (PRILOSEC) 20 MG capsule Take 20 mg by mouth daily.    [provider]  ondansetron (ZOFRAN) 8 MG tablet Take 1 tablet (8 mg total) by mouth every 8 (eight) hours as needed for nausea. 05/24/16   [name], MD  polyethylene glycol (MIRALAX / GLYCOLAX) packet Take 17 g by mouth 2 (two) times daily.    [provider]  ranitidine (ZANTAC) 75 MG tablet Take 75 mg by mouth 2 (two) times daily.    [provider]  senna (SENOKOT) 8.6 MG TABS tablet Take 1 tablet by mouth at bedtime.    [provider]  temazepam (RESTORIL) 30 MG capsule Take 30 mg by mouth at bedtime as needed for  sleep. Patient takes 15 mg    [provider]  Venlafaxine HCl (EFFEXOR PO) Take 75 mg by mouth.    [provider]  VITAMIN B1-B12 IM Inject into the muscle every 30 (thirty) days.    [provider]    Physical Exam: BP (!) 147/75   Pulse 82   Temp 98 F (36.7 C) (Oral)   Resp 18   SpO2 98%   General:  Frail , pale, weak and lethargic, oriented x3 , good historian. thin Eyes: PERRL ENT: unremarkable Neck: supple, no JVD Cardiovascular: RRR Respiratory: decreased right lower lobe Abdomen: soft/ND/ND, positive bowel sounds Skin: no rash Musculoskeletal:  No edema Psychiatric: depressed, flat affect,  Neurologic: no focal findings            Labs on Admission:  Basic Metabolic Panel:  Recent Labs Lab 01/24/17 1048 01/25/17 1349 01/25/17 1407  NA 122* 125* 125*  K 3.8 3.6 3.4*  CL 84* 88* 85*  CO2 32 29  --   GLUCOSE 108* 109* 104*  BUN 20 19 17   CREATININE 0.73 0.78 0.80  CALCIUM 8.6 7.9*  --    Liver Function Tests:  Recent Labs Lab 01/24/17 1048 01/25/17 1349  AST 47* 46*  ALT 41 38  ALKPHOS 86 85  BILITOT 1.1 1.0  PROT 6.4 5.7*  ALBUMIN 3.3* 2.6*   No results for input(s): LIPASE, AMYLASE in the last 168 hours. No results for input(s): AMMONIA in the last 168 hours. CBC:  Recent Labs Lab 01/24/17 1048 01/25/17 1349 01/25/17 1407  WBC 21.0 Repeated and verified X2.* 17.4*  --   NEUTROABS 18.4* 14.5*  --   HGB 11.8* 10.2* 9.9*  HCT 35.1* 30.0* 29.0*  MCV 87.2 85.0  --   PLT 522.0* 408*  --    Cardiac Enzymes: No results for input(s): CKTOTAL, CKMB, CKMBINDEX, TROPONINI in the last 168 hours.  BNP (last 3 results) No results for input(s): BNP in the last 8760 hours.  ProBNP (last 3 results) No results for input(s): PROBNP in the last 8760 hours.  CBG: No results for input(s): GLUCAP in the last 168 hours.  Radiological Exams on Admission: No results found.  EKG: Independently reviewed.  Sinus rhythm, no  acute ST-T changes ,QTC 488  Assessment/Plan Present on Admission: **None**   Leukocytosis differential including infection/dehydration versus recurrent CLL -Chest x-ray concerning of worsening of pneumonia, although patient denies of chest pain, no short of breath, no fever.  he does report intermittent cough sometimes productive. -Will start Unasyn  -Continue hydration -Repeat CBC with differential and in the morning -case discussed with oncology Dr. Alvy Bimler , she will  see patient in consult.  Normocytic anemia -No overt bleeding -Hemoglobin on presentation is 10,  hgb around 12 in 11-03-2016 -iron study  Hyponatremia Does not appear to have confusion cause of hyponatremia likely multifactorial including dehydration and ongoing pneumonia and side effect from depression medication. Continue hydration, treat pneumonia, psychiatry consulted for depression management.  Hypokalemia: Replace K, check mag  Mild elevation of LFT, unknown significance, repeat in a.m.  Depression Reports suicidal ideation constantly  since wife passed away in 2022/11/04 He is active followed by psychiatry outpatient , he is in the process of changing depression medication. Initially he is he expressed active suicidal ideation, sitter was ordered, later he stated that he will not do it. Sitter discontinued. Check tsh, psychiatry consulted   FTT/ malnutrition:  Will get nutrition consult, PT eval    DVT prophylaxis: lovenox  Consultants: oncology Dr Alvy Bimler  Code Status: DNR  Family Communication:  Patient and brother in room  Disposition Plan: admit to med surg  Time spent: 74mins  Erina Hamme MD, PhD [facility] Pager [phone] If 7PM-7AM, please contact night-coverage at [email or web address], password [facility]

## 2017-01-25 NOTE — Progress Notes (Signed)
egd today negative Discussed w/ Dr. Felipa Eth and Erlinda [facility] hospitalist) Will admit for IVF, psych consult, nutrition support May need Psych inpatient admit

## 2017-01-25 NOTE — Progress Notes (Signed)
Report to PACU, RN, vss, BBS= Clear.  

## 2017-01-25 NOTE — Progress Notes (Signed)
Patient denies any suicidal thoughts. States he is depressed but no plans to hurt himself or anyone else. Md notified.

## 2017-01-25 NOTE — ED Notes (Signed)
ADMISSION Provider at bedside. 

## 2017-01-26 ENCOUNTER — Inpatient Hospital Stay (HOSPITAL_COMMUNITY): Payer: Medicare Other

## 2017-01-26 ENCOUNTER — Telehealth: Payer: Self-pay

## 2017-01-26 ENCOUNTER — Encounter (HOSPITAL_COMMUNITY): Payer: Self-pay | Admitting: Radiology

## 2017-01-26 DIAGNOSIS — F329 Major depressive disorder, single episode, unspecified: Secondary | ICD-10-CM

## 2017-01-26 DIAGNOSIS — R634 Abnormal weight loss: Secondary | ICD-10-CM

## 2017-01-26 DIAGNOSIS — F321 Major depressive disorder, single episode, moderate: Secondary | ICD-10-CM

## 2017-01-26 DIAGNOSIS — F419 Anxiety disorder, unspecified: Secondary | ICD-10-CM

## 2017-01-26 DIAGNOSIS — R4584 Anhedonia: Secondary | ICD-10-CM

## 2017-01-26 DIAGNOSIS — J690 Pneumonitis due to inhalation of food and vomit: Secondary | ICD-10-CM

## 2017-01-26 DIAGNOSIS — R531 Weakness: Secondary | ICD-10-CM

## 2017-01-26 DIAGNOSIS — D638 Anemia in other chronic diseases classified elsewhere: Secondary | ICD-10-CM

## 2017-01-26 DIAGNOSIS — R450 Nervousness: Secondary | ICD-10-CM

## 2017-01-26 DIAGNOSIS — E440 Moderate protein-calorie malnutrition: Secondary | ICD-10-CM

## 2017-01-26 DIAGNOSIS — G4700 Insomnia, unspecified: Secondary | ICD-10-CM

## 2017-01-26 DIAGNOSIS — E871 Hypo-osmolality and hyponatremia: Secondary | ICD-10-CM

## 2017-01-26 DIAGNOSIS — J189 Pneumonia, unspecified organism: Secondary | ICD-10-CM | POA: Diagnosis present

## 2017-01-26 DIAGNOSIS — D72829 Elevated white blood cell count, unspecified: Secondary | ICD-10-CM

## 2017-01-26 DIAGNOSIS — D649 Anemia, unspecified: Secondary | ICD-10-CM

## 2017-01-26 DIAGNOSIS — R509 Fever, unspecified: Secondary | ICD-10-CM

## 2017-01-26 DIAGNOSIS — C9110 Chronic lymphocytic leukemia of B-cell type not having achieved remission: Secondary | ICD-10-CM

## 2017-01-26 LAB — COMPREHENSIVE METABOLIC PANEL
ALK PHOS: 83 U/L (ref 38–126)
ALT: 37 U/L (ref 17–63)
AST: 45 U/L — ABNORMAL HIGH (ref 15–41)
Albumin: 2.5 g/dL — ABNORMAL LOW (ref 3.5–5.0)
Anion gap: 10 (ref 5–15)
BUN: 17 mg/dL (ref 6–20)
CALCIUM: 7.8 mg/dL — AB (ref 8.9–10.3)
CO2: 26 mmol/L (ref 22–32)
CREATININE: 0.74 mg/dL (ref 0.61–1.24)
Chloride: 92 mmol/L — ABNORMAL LOW (ref 101–111)
Glucose, Bld: 102 mg/dL — ABNORMAL HIGH (ref 65–99)
Potassium: 3.6 mmol/L (ref 3.5–5.1)
SODIUM: 128 mmol/L — AB (ref 135–145)
Total Bilirubin: 0.9 mg/dL (ref 0.3–1.2)
Total Protein: 5.4 g/dL — ABNORMAL LOW (ref 6.5–8.1)

## 2017-01-26 LAB — CBC WITH DIFFERENTIAL/PLATELET
Basophils Absolute: 0 10*3/uL (ref 0.0–0.1)
Basophils Relative: 0 %
Eosinophils Absolute: 0 10*3/uL (ref 0.0–0.7)
Eosinophils Relative: 0 %
HCT: 29.5 % — ABNORMAL LOW (ref 39.0–52.0)
HEMOGLOBIN: 10.1 g/dL — AB (ref 13.0–17.0)
LYMPHS PCT: 7 %
Lymphs Abs: 1.1 10*3/uL (ref 0.7–4.0)
MCH: 29.1 pg (ref 26.0–34.0)
MCHC: 34.2 g/dL (ref 30.0–36.0)
MCV: 85 fL (ref 78.0–100.0)
Monocytes Absolute: 1.3 10*3/uL — ABNORMAL HIGH (ref 0.1–1.0)
Monocytes Relative: 8 %
NEUTROS PCT: 85 %
Neutro Abs: 13.2 10*3/uL — ABNORMAL HIGH (ref 1.7–7.7)
Platelets: 436 10*3/uL — ABNORMAL HIGH (ref 150–400)
RBC: 3.47 MIL/uL — AB (ref 4.22–5.81)
RDW: 13.8 % (ref 11.5–15.5)
WBC: 15.6 10*3/uL — AB (ref 4.0–10.5)

## 2017-01-26 LAB — TSH: TSH: 1.108 u[IU]/mL (ref 0.350–4.500)

## 2017-01-26 LAB — IRON AND TIBC
IRON: 15 ug/dL — AB (ref 45–182)
SATURATION RATIOS: 9 % — AB (ref 17.9–39.5)
TIBC: 168 ug/dL — AB (ref 250–450)
UIBC: 153 ug/dL

## 2017-01-26 LAB — MAGNESIUM: Magnesium: 1.7 mg/dL (ref 1.7–2.4)

## 2017-01-26 MED ORDER — VENLAFAXINE HCL ER 75 MG PO CP24
75.0000 mg | ORAL_CAPSULE | Freq: Every day | ORAL | Status: DC
  Administered 2017-01-26: 75 mg via ORAL
  Filled 2017-01-26: qty 1

## 2017-01-26 MED ORDER — SODIUM CHLORIDE 0.9 % IV SOLN
3.0000 g | Freq: Four times a day (QID) | INTRAVENOUS | Status: DC
  Administered 2017-01-26 – 2017-01-29 (×12): 3 g via INTRAVENOUS
  Filled 2017-01-26 (×13): qty 3

## 2017-01-26 MED ORDER — VENLAFAXINE HCL ER 150 MG PO CP24
150.0000 mg | ORAL_CAPSULE | Freq: Every day | ORAL | Status: DC
  Administered 2017-01-27 – 2017-01-29 (×3): 150 mg via ORAL
  Filled 2017-01-26 (×3): qty 1

## 2017-01-26 MED ORDER — IOPAMIDOL (ISOVUE-300) INJECTION 61%
75.0000 mL | Freq: Once | INTRAVENOUS | Status: AC | PRN
  Administered 2017-01-26: 75 mL via INTRAVENOUS

## 2017-01-26 MED ORDER — IOPAMIDOL (ISOVUE-300) INJECTION 61%
INTRAVENOUS | Status: AC
  Filled 2017-01-26: qty 75

## 2017-01-26 MED ORDER — ENSURE ENLIVE PO LIQD
237.0000 mL | Freq: Three times a day (TID) | ORAL | Status: DC
  Administered 2017-01-26 – 2017-01-29 (×7): 237 mL via ORAL

## 2017-01-26 NOTE — Telephone Encounter (Signed)
Pt was admitted to hospital on yesterday.

## 2017-01-26 NOTE — Progress Notes (Signed)
Christopher Fowler.   DOB:02-Mar-1925   NG#:[id number]   This patient is well-known to me. He was last seen 4 months ago in the outpatient clinic after completion of treatment The patient had complete response to chemotherapy. Summary of oncologic history as follows:   CLL (chronic lymphocytic leukemia) ([place])   10/28/2015 Pathology Results    Flow cytometry confirmed CLL      10/28/2015 Tumor Marker    FISH panel confirmed del 13 q      05/24/2016 PET scan    There is a mildly prominent AP window lymph node and numerous small axillary lymph nodes, but these are not hypermetabolic beyond the mediastinal blood pool activity. 2. Mild splenomegaly, without abnormal splenic activity. 3. Other imaging findings of potential clinical significance: Chronic left frontal and right maxillary sinusitis. Coronary, aortic arch, and branch vessel atherosclerotic vascular disease. Aortoiliac atherosclerotic vascular disease. Mildly prominent prostate gland. Right scrotal hydrocele. Spondylosis, with postoperative findings in the lumbar spine. Degenerative arthropathy of both hips.      06/05/2016 -  Chemotherapy    He was started on Gazyva      08/25/2016 PET scan    1. The AP window lymph node is slightly smaller at 1.1 cm, and remains of similar activity to background blood pool. 2. Prior splenomegaly has resolved. Normal splenic activity. 3. No current active hypermetabolic activity to suggest active malignancy. 4. Other imaging findings of potential clinical significance: Aortic Atherosclerosis (ICD10-I70.0). Coronary atherosclerosis. Prominent stool throughout the colon favors constipation. Prostatomegaly. Right scrotal hydrocele. Mild chronic left frontal sinusitis. Biapical pleuroparenchymal scarring.       I was informed by his daughter recently that the patient was diagnosed with pneumonia The patient has been having cough.  He denies hemoptysis.  He had recurrent fever. He had poor appetite and has  been grieving for the death of his wife over the past few months He has lost a lot of weight He had altered taste sensation in his mouth and underwent EGD evaluation which came back unremarkable He disliked the food served to him when he was in the skilled nursing facility.  He ate better in the hospital.  He stated that he has a desire to eat He is not suicidal.  He denies recent alcohol intake He denies recent choking sensation or dysphagia.  Objective:  Vitals:   01/25/17 2018 01/26/17 0537  BP: (!) 142/63 (!) 141/75  Pulse: 97 (!) 105  Resp: 13 15  Temp: 98.5 F (36.9 C) (!) 100.7 F (38.2 C)  SpO2: 99% 91%     Intake/Output Summary (Last 24 hours) at 01/26/17 4401 Last data filed at 01/26/17 0600  Gross per 24 hour  Intake           2152.5 ml  Output             1900 ml  Net            252.5 ml    GENERAL:alert, no distress and comfortable.  He looks thin and frail, drastically different from his last visit with me SKIN: skin color, texture, turgor are normal, no rashes or significant lesions EYES: normal, Conjunctiva are pink and non-injected, sclera clear OROPHARYNX:no exudate, no erythema and lips, buccal mucosa, and tongue normal  NECK: supple, thyroid normal size, non-tender, without nodularity LYMPH:  no palpable lymphadenopathy in the cervical, axillary or inguinal LUNGS: Reduced breath sound bilateral lung bases HEART: regular rate & rhythm and no murmurs and no lower  extremity edema ABDOMEN:abdomen soft, non-tender and normal bowel sounds Musculoskeletal:no cyanosis of digits and no clubbing  NEURO: alert & oriented x 3 with fluent speech, no focal motor/sensory deficits   Labs:  Lab Results  Component Value Date   WBC 15.6 (H) 01/26/2017   HGB 10.1 (L) 01/26/2017   HCT 29.5 (L) 01/26/2017   MCV 85.0 01/26/2017   PLT 436 (H) 01/26/2017   NEUTROABS 13.2 (H) 01/26/2017    Lab Results  Component Value Date   NA 128 (L) 01/26/2017   K 3.6 01/26/2017    CL 92 (L) 01/26/2017   CO2 26 01/26/2017    Studies:  Dg Chest 2 View  Result Date: 01/25/2017 CLINICAL DATA:  Cough. EXAM: CHEST  2 VIEW COMPARISON:  Radiographs of January 05, 2017. PET scan of August 25, 2016. FINDINGS: The heart size and mediastinal contours are within normal limits. No pneumothorax or pleural effusion is noted. Left lung is clear. Increased right lower lobe opacity is noted. The visualized skeletal structures are unremarkable. IMPRESSION: Right lower lobe opacity noted on prior exam has increased in size. While this may simply represent worsening pneumonia, CT scan of the chest is recommended to evaluate for possible underlying neoplasm or malignancy. Electronically Signed   By: Marijo Conception, M.D.   On: 01/25/2017 16:44    Assessment & Plan:   History of CLL His PET CT scan from June 2018 showed complete response However, with unresolved symptoms of pneumonia and abnormal chest x-ray findings, I recommend CT scan for further evaluation and he agreed to proceed His blood work is predominantly neutrophilia, suspicious for infection rather than recurrence of CLL although malignancy cannot be excluded  Anemia chronic disease He is not symptomatic.  Observe  Thrombocytosis Likely reactive to infection Observe only  Hyponatremia Likely secondary to pulmonary process Observe only  Moderate protein calorie malnutrition He stated he has appetite to eat Consider Remeron if not improved We discussed about appetite stimulant but the patient declined  Constipation He is on MiraLAX and Senokot Continue the same  Profound weakness He would benefit from PT evaluation  Discharge planning He is frail and weak Pending further evaluation, I suspect he will be here over the weekend Please call consult service over the weekend if questions arise I would be back on Monday to check on him   [name], MD 01/26/2017  8:12 AM

## 2017-01-26 NOTE — Progress Notes (Signed)
OT Cancellation Note  Patient Details Name: Christopher Fowler. MRN: [id number] DOB: November 27, 1924   Cancelled Treatment:    Reason Eval/Treat Not Completed: Patient at procedure or test/ unavailable  [name] 01/26/2017, 2:40 PM  [name], OTR/L [phone] 01/26/2017

## 2017-01-26 NOTE — Progress Notes (Signed)
Pharmacy Antibiotic Note  Xavier Munger. is a 83 y.o. male admitted on 01/25/2017 with aspiration PNA.  Pharmacy has been consulted for Unasyn dosing.  Plan:  Unasyn 3gm q6h  Follow up renal fxn, culture results, and clinical course.   Height: 6' (182.9 cm) Weight: 150 lb (68 kg) IBW/kg (Calculated) : 77.6  Temp (24hrs), Avg:98.7 F (37.1 C), Min:98 F (36.7 C), Max:100.7 F (38.2 C)   Recent Labs Lab 01/24/17 1048 01/25/17 1349 01/25/17 1406 01/25/17 1407 01/25/17 1527 01/26/17 0556  WBC 21.0 Repeated and verified X2.* 17.4*  --   --   --  15.6*  CREATININE 0.73 0.78  --  0.80  --  0.74  LATICACIDVEN  --   --  0.81  --  1.21  --     Estimated Creatinine Clearance: 59 mL/min (by C-G formula based on SCr of 0.74 mg/dL).    Allergies  Allergen Reactions  . Cymbalta [Duloxetine Hcl]   . Remeron [Mirtazapine]   . Sulfa Antibiotics Hives   Antimicrobials this admission: 11/1 Unasyn >>   Dose adjustments this admission:  Microbiology results: 11/1  MRSA PCR: neg  Thank you for allowing pharmacy to be a part of this patient's care.  Gretta Arab PharmD, BCPS Pager [phone] 01/26/2017 11:13 AM

## 2017-01-26 NOTE — Progress Notes (Signed)
Initial Nutrition Assessment  DOCUMENTATION CODES:   Severe malnutrition in context of social or environmental circumstances  INTERVENTION:   Ensure Enlive po TID, each supplement provides 350 kcal and 20 grams of protein  NUTRITION DIAGNOSIS:   Severe Malnutrition related to social / environmental circumstances (depression) as evidenced by severe fat depletion, severe muscle depletion.  GOAL:   Patient will meet greater than or equal to 90% of their needs  MONITOR:   PO intake, Supplement acceptance, Weight trends, Labs  REASON FOR ASSESSMENT:   Consult Assessment of nutrition requirement/status  ASSESSMENT:   Pt with PMH significant for chronic lymphocytic leukemia (in remission), BPH, and neurogenic bladder (self catheterization). Presents this admission after being sent to ED for failure to thrive, dehydration, worsening depression, and hyponatremia.   Spoke with pt at bedside. Reports having decreased PO intake related to food preferences at [facility]. States some seasonings leave a thick mucus like texture in his mouth. Making options hard to tolerate Pt eats three meals a day, typically finishing 50% of his plate. Drinks Ensure BID. Discussed the passing of his wife in late June and how it has affected his relationship with food. States he just does not eat like he used to.   RD helped pt order lunch. Pt amendable to Ensure Vanilla this stay.   Records indicate pt;'s weight has fluctuated since March 2018. Pt unaware of any recent unintentional wt loss. Reports a UBW of 180 lbs, the last time being at that weight   Nutrition-Focused physical exam completed.   Medications reviewed and include: NS @ 125 ml/hr, IV abx Labs reviewed: Na 128 (L) calcium ionized 1.06 (H) AST 45 (H)   NUTRITION - FOCUSED PHYSICAL EXAM:    Most Recent Value  Orbital Region  Severe depletion  Upper Arm Region  Severe depletion  Thoracic and Lumbar Region  Unable to assess  Buccal  Region  Severe depletion  Temple Region  Severe depletion  Clavicle Bone Region  Severe depletion  Clavicle and Acromion Bone Region  Severe depletion  Scapular Bone Region  Unable to assess  Dorsal Hand  Severe depletion  Patellar Region  Severe depletion  Anterior Thigh Region  Severe depletion  Posterior Calf Region  Severe depletion  Edema (RD Assessment)  None  Hair  Reviewed  Eyes  Reviewed  Mouth  Reviewed  Skin  Reviewed  Nails  Reviewed       Diet Order:  Diet regular Room service appropriate? Yes; Fluid consistency: Thin  EDUCATION NEEDS:   Education needs have been addressed  Skin:  Skin Assessment: Reviewed RN Assessment  Last BM:  01/24/17  Height:   Ht Readings from Last 1 Encounters:  01/25/17 6' (1.829 m)    Weight:   Wt Readings from Last 1 Encounters:  01/25/17 150 lb (68 kg)    Ideal Body Weight:  80.9 kg  BMI:  Body mass index is 20.34 kg/m.  Estimated Nutritional Needs:   Kcal:  2400-2600 kcal  Protein:  130-140 g/day  Fluid:  >2.4 L/day    Mariana Single RD, LDN Clinical Nutrition Pager # - [phone]

## 2017-01-26 NOTE — Consult Note (Addendum)
[facility] Face-to-Face Psychiatry Consult   Reason for Consult:  Suicide risk assessment and depression. Referring Physician:  Dr. [place] Pink Patient Identification: Christopher Fowler. MRN:  [id number] Principal Diagnosis: MDD (major depressive disorder), single episode, moderate ([place]) Diagnosis:   Patient Active Problem List   Diagnosis Date Noted  . Hyponatremia [E87.1] 01/25/2017  . Other constipation [K59.09] 07/31/2016  . Weight loss [R63.4] 07/03/2016  . Hypersensitivity reaction [T78.40XA] 06/05/2016  . Goals of care, counseling/discussion [Z71.89] 05/24/2016  . Lymphoma, small lymphocytic ([place]) [C83.00] 05/04/2016  . Pancytopenia, acquired ([place]) [Z48.270] 05/04/2016  . Gilbert's syndrome [E80.4] 10/29/2015  . Anemia due to chronic illness [D63.8] 10/29/2015  . CLL (chronic lymphocytic leukemia) ([place]) [C91.90] 10/28/2015    Total Time spent with patient: 1 hour  Subjective:   Christopher Denn. is a 83 y.o. male patient admitted with aspiration pneumonia and psychiatry was consulted for suicide risk assessment and depression.  HPI:   Christopher Fowler reports that he has been treated for several years by [name]. He reports that he has a history of anxiety. He was recently switched from Lexapro to Effexor. He does not know how long it takes to work but he also does not want to have his medications adjusted at this time since he would prefer to wait until follow up with his psychiatrist. He reports feeling more depressed recently. He does not like his living facility. The food is not to his liking and the environment is poor. He is planning to move to another facility. He is also grieving the lost of his wife. She passed away in 09/14/2022. He prayed every night that he would out live her but now he feels like this was not the best wish. He denies SI but he reports feeling hopeless. He reports that he is ready to leave this earth but only when God is ready for him. He would not end his life because  he has his family to live for (son, daughter and daughter-in-law). He denies problems with his appetite or sleep. He reports anhedonia. He used to enjoy walking but he has been unable to walk due to physical deconditioning. He also reports that he is no longer able to read due to poor concentration. He denies a history of manic symptoms, AVH or suicide attempts. He currently takes Effexor XR 75 mg daily for depression and anxiety and Restoril 30 mg qhs for sleep.   Past Psychiatric History: GAD  Risk to Self: Is patient at risk for suicide?: No Risk to Others:  No Prior Inpatient Therapy:  No.  Prior Outpatient Therapy:  Yes. He sees [name] for psychiatric medication management.   Past Medical History:  Past Medical History:  Diagnosis Date  . BPH (benign prostatic hyperplasia)   . CLL (chronic lymphocytic leukemia) ([place])   . Degenerative joint disease (DJD) of hip    hip  . DJD (degenerative joint disease) of cervical spine   . DJD (degenerative joint disease) of knee    left  . Elevated MCV   . Gilbert's syndrome   . Hernia, inguinal    left  . Neurogenic bladder   . Pancreatic cyst    2cm negative needle aspiration  . Spermatocele    right  . 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 BACK SURGERY     L4 herniated disk  . COLONOSCOPY    . EUS  2007   Family History:  Family History  Problem Relation Age of Onset  . Breast cancer Mother 50  . Cancer Paternal Uncle 39       unknown  . Stomach cancer Neg Hx   . Colon cancer Neg Hx   . Esophageal cancer Neg Hx   . Rectal cancer Neg Hx    Family Psychiatric  History: Denies Social History:  History  Alcohol Use  . 1.8 oz/week  . 3 Glasses of wine per week     History  Drug Use No    Social History   Social History  . Marital status: Widowed    Spouse name: Christopher Fowler  . Number of children: 1  . Years of education: N/A   Occupational History  . retired  [employer]   Social History Main Topics  . Smoking status: Never Smoker  . Smokeless tobacco: Never Used  . Alcohol use 1.8 oz/week    3 Glasses of wine per week  . Drug use: No  . Sexual activity: No   Other Topics Concern  . None   Social History Narrative   Widowed summer 2018   Friend's home, moved into assisted living summer 2018   Brother is Christopher Fowler      Additional Social History: He lives at an ALF, [facility] for the past 2 weeks. He was previously living independently with his wife. She passed away in 23-Sep-2022. He was married 50 years. He denies alcohol, illicit substance or tobacco use. He previously worked as a Freight forwarder. He served time in [employer]. He did not serve in combat.     Allergies:   Allergies  Allergen Reactions  . Cymbalta [Duloxetine Hcl]   . Remeron [Mirtazapine]   . Sulfa Antibiotics Hives    Labs:  Results for orders placed or performed during the hospital encounter of 01/25/17 (from the past 48 hour(s))  Comprehensive metabolic panel     Status: Abnormal   Collection Time: 01/25/17  1:49 PM  Result Value Ref Range   Sodium 125 (L) 135 - 145 mmol/L   Potassium 3.6 3.5 - 5.1 mmol/L   Chloride 88 (L) 101 - 111 mmol/L   CO2 29 22 - 32 mmol/L   Glucose, Bld 109 (H) 65 - 99 mg/dL   BUN 19 6 - 20 mg/dL   Creatinine, Ser 0.78 0.61 - 1.24 mg/dL   Calcium 7.9 (L) 8.9 - 10.3 mg/dL   Total Protein 5.7 (L) 6.5 - 8.1 g/dL   Albumin 2.6 (L) 3.5 - 5.0 g/dL   AST 46 (H) 15 - 41 U/L   ALT 38 17 - 63 U/L   Alkaline Phosphatase 85 38 - 126 U/L   Total Bilirubin 1.0 0.3 - 1.2 mg/dL   GFR calc non Af Amer >60 >60 mL/min   GFR calc Af Amer >60 >60 mL/min    Comment: (NOTE) The eGFR has been calculated using the CKD EPI equation. This calculation has not been validated in all clinical situations. eGFR's persistently <60 mL/min signify possible Chronic Kidney Disease.    Anion gap 8 5 - 15  CBC with Differential     Status:  Abnormal   Collection Time: 01/25/17  1:49 PM  Result Value Ref Range   WBC 17.4 (H) 4.0 - 10.5 K/uL   RBC 3.53 (L) 4.22 - 5.81 MIL/uL   Hemoglobin 10.2 (L) 13.0 - 17.0 g/dL   HCT 30.0 (L) 39.0 - 52.0 %   MCV 85.0  78.0 - 100.0 fL   MCH 28.9 26.0 - 34.0 pg   MCHC 34.0 30.0 - 36.0 g/dL   RDW 13.7 11.5 - 15.5 %   Platelets 408 (H) 150 - 400 K/uL   Neutrophils Relative % 84 %   Neutro Abs 14.5 (H) 1.7 - 7.7 K/uL   Lymphocytes Relative 8 %   Lymphs Abs 1.4 0.7 - 4.0 K/uL   Monocytes Relative 8 %   Monocytes Absolute 1.5 (H) 0.1 - 1.0 K/uL   Eosinophils Relative 0 %   Eosinophils Absolute 0.0 0.0 - 0.7 K/uL   Basophils Relative 0 %   Basophils Absolute 0.0 0.0 - 0.1 K/uL  I-Stat CG4 Lactic Acid, ED     Status: None   Collection Time: 01/25/17  2:06 PM  Result Value Ref Range   Lactic Acid, Venous 0.81 0.5 - 1.9 mmol/L  I-stat chem 8, ed     Status: Abnormal   Collection Time: 01/25/17  2:07 PM  Result Value Ref Range   Sodium 125 (L) 135 - 145 mmol/L   Potassium 3.4 (L) 3.5 - 5.1 mmol/L   Chloride 85 (L) 101 - 111 mmol/L   BUN 17 6 - 20 mg/dL   Creatinine, Ser 0.80 0.61 - 1.24 mg/dL   Glucose, Bld 104 (H) 65 - 99 mg/dL   Calcium, Ion 1.06 (L) 1.15 - 1.40 mmol/L   TCO2 26 22 - 32 mmol/L   Hemoglobin 9.9 (L) 13.0 - 17.0 g/dL   HCT 29.0 (L) 39.0 - 52.0 %  Urinalysis, Routine w reflex microscopic     Status: Abnormal   Collection Time: 01/25/17  2:21 PM  Result Value Ref Range   Color, Urine YELLOW YELLOW   APPearance CLEAR CLEAR   Specific Gravity, Urine 1.008 1.005 - 1.030   pH 7.0 5.0 - 8.0   Glucose, UA NEGATIVE NEGATIVE mg/dL   Hgb urine dipstick SMALL (A) NEGATIVE   Bilirubin Urine NEGATIVE NEGATIVE   Ketones, ur NEGATIVE NEGATIVE mg/dL   Protein, ur NEGATIVE NEGATIVE mg/dL   Nitrite NEGATIVE NEGATIVE   Leukocytes, UA NEGATIVE NEGATIVE   RBC / HPF 0-5 0 - 5 RBC/hpf   WBC, UA 0-5 0 - 5 WBC/hpf   Bacteria, UA NONE SEEN NONE SEEN   Squamous Epithelial / LPF NONE SEEN  NONE SEEN  I-Stat CG4 Lactic Acid, ED     Status: None   Collection Time: 01/25/17  3:27 PM  Result Value Ref Range   Lactic Acid, Venous 1.21 0.5 - 1.9 mmol/L  MRSA PCR Screening     Status: None   Collection Time: 01/25/17  5:47 PM  Result Value Ref Range   MRSA by PCR NEGATIVE NEGATIVE    Comment:        The GeneXpert MRSA Assay (FDA approved for NASAL specimens only), is one component of a comprehensive MRSA colonization surveillance program. It is not intended to diagnose MRSA infection nor to guide or monitor treatment for MRSA infections.   TSH     Status: None   Collection Time: 01/26/17  5:56 AM  Result Value Ref Range   TSH 1.108 0.350 - 4.500 uIU/mL    Comment: Performed by a 3rd Generation assay with a functional sensitivity of <=0.01 uIU/mL.  Iron and TIBC     Status: Abnormal   Collection Time: 01/26/17  5:56 AM  Result Value Ref Range   Iron 15 (L) 45 - 182 ug/dL   TIBC 168 (L) 250 - 450  ug/dL   Saturation Ratios 9 (L) 17.9 - 39.5 %   UIBC 153 ug/dL    Comment: Performed at [facility], [place] [address] [place], [place] [address]  CBC with Differential/Platelet     Status: Abnormal   Collection Time: 01/26/17  5:56 AM  Result Value Ref Range   WBC 15.6 (H) 4.0 - 10.5 K/uL   RBC 3.47 (L) 4.22 - 5.81 MIL/uL   Hemoglobin 10.1 (L) 13.0 - 17.0 g/dL   HCT 29.5 (L) 39.0 - 52.0 %   MCV 85.0 78.0 - 100.0 fL   MCH 29.1 26.0 - 34.0 pg   MCHC 34.2 30.0 - 36.0 g/dL   RDW 13.8 11.5 - 15.5 %   Platelets 436 (H) 150 - 400 K/uL   Neutrophils Relative % 85 %   Neutro Abs 13.2 (H) 1.7 - 7.7 K/uL   Lymphocytes Relative 7 %   Lymphs Abs 1.1 0.7 - 4.0 K/uL   Monocytes Relative 8 %   Monocytes Absolute 1.3 (H) 0.1 - 1.0 K/uL   Eosinophils Relative 0 %   Eosinophils Absolute 0.0 0.0 - 0.7 K/uL   Basophils Relative 0 %   Basophils Absolute 0.0 0.0 - 0.1 K/uL  Comprehensive metabolic panel     Status: Abnormal   Collection Time: 01/26/17  5:56 AM  Result Value Ref  Range   Sodium 128 (L) 135 - 145 mmol/L   Potassium 3.6 3.5 - 5.1 mmol/L   Chloride 92 (L) 101 - 111 mmol/L   CO2 26 22 - 32 mmol/L   Glucose, Bld 102 (H) 65 - 99 mg/dL   BUN 17 6 - 20 mg/dL   Creatinine, Ser 0.74 0.61 - 1.24 mg/dL   Calcium 7.8 (L) 8.9 - 10.3 mg/dL   Total Protein 5.4 (L) 6.5 - 8.1 g/dL   Albumin 2.5 (L) 3.5 - 5.0 g/dL   AST 45 (H) 15 - 41 U/L   ALT 37 17 - 63 U/L   Alkaline Phosphatase 83 38 - 126 U/L   Total Bilirubin 0.9 0.3 - 1.2 mg/dL   GFR calc non Af Amer >60 >60 mL/min   GFR calc Af Amer >60 >60 mL/min    Comment: (NOTE) The eGFR has been calculated using the CKD EPI equation. This calculation has not been validated in all clinical situations. eGFR's persistently <60 mL/min signify possible Chronic Kidney Disease.    Anion gap 10 5 - 15  Magnesium     Status: None   Collection Time: 01/26/17  5:56 AM  Result Value Ref Range   Magnesium 1.7 1.7 - 2.4 mg/dL    Current Facility-Administered Medications  Medication Dose Route Frequency Provider Last Rate Last Dose  . 0.9 %  sodium chloride infusion   Intravenous Continuous Florencia Reasons, MD 125 mL/hr at 01/25/17 2359    . Ampicillin-Sulbactam (UNASYN) 3 g in sodium chloride 0.9 % 100 mL IVPB  3 g Intravenous Q6H Shade, Christine E, RPH   Stopped at 01/26/17 1000  . aspirin EC tablet 81 mg  81 mg Oral Daily Florencia Reasons, MD   81 mg at 01/26/17 2423  . enoxaparin (LOVENOX) injection 40 mg  40 mg Subcutaneous Q24H Florencia Reasons, MD      . famotidine (PEPCID) tablet 10 mg  10 mg Oral QHS Florencia Reasons, MD      . feeding supplement (ENSURE ENLIVE) (ENSURE ENLIVE) liquid 237 mL  237 mL Oral TID BM Bonnielee Haff, MD      .  finasteride (PROSCAR) tablet 5 mg  5 mg Oral Daily Florencia Reasons, MD   5 mg at 01/26/17 0925  . fluticasone (FLONASE) 50 MCG/ACT nasal spray 1 spray  1 spray Each Nare Daily Florencia Reasons, MD   1 spray at 01/26/17 0926  . loratadine (CLARITIN) tablet 10 mg  10 mg Oral Daily Florencia Reasons, MD   10 mg at 01/26/17 0925  .  montelukast (SINGULAIR) tablet 10 mg  10 mg Oral QHS Florencia Reasons, MD   10 mg at 01/25/17 2138  . pantoprazole (PROTONIX) EC tablet 40 mg  40 mg Oral Daily Florencia Reasons, MD   40 mg at 01/26/17 0925  . polyethylene glycol (MIRALAX / GLYCOLAX) packet 17 g  17 g Oral BID Florencia Reasons, MD   17 g at 01/26/17 0926  . senna-docusate (Senokot-S) tablet 1 tablet  1 tablet Oral BID Florencia Reasons, MD   1 tablet at 01/26/17 [phone]  . temazepam (RESTORIL) capsule 30 mg  30 mg Oral QHS Florencia Reasons, MD   30 mg at 01/25/17 2138  . venlafaxine XR (EFFEXOR-XR) 24 hr capsule 75 mg  75 mg Oral Q breakfast Bonnielee Haff, MD   75 mg at 01/26/17 1039    Musculoskeletal: Strength & Muscle Tone: within normal limits Gait & Station: normal Patient leans: N/A  Psychiatric Specialty Exam: Physical Exam  Nursing note and vitals reviewed. Constitutional: He is oriented to person, place, and time. He appears well-developed and well-nourished.  HENT:  Head: Normocephalic and atraumatic.  Neck: Normal range of motion.  Respiratory: Effort normal.  Musculoskeletal: Normal range of motion.  Neurological: He is alert and oriented to person, place, and time.  Skin: No rash noted.  Psychiatric: He has a normal mood and affect. His behavior is normal. Judgment and thought content normal.    Review of Systems  Constitutional: Negative for chills and fever.  Musculoskeletal: Negative for joint pain.  Neurological: Positive for weakness.  Psychiatric/Behavioral: Positive for depression. Negative for hallucinations, memory loss, substance abuse and suicidal ideas. The patient is nervous/anxious. The patient does not have insomnia.     Blood pressure (!) 141/75, pulse (!) 105, temperature (!) 100.7 F (38.2 C), temperature source Oral, resp. rate 15, height 6' (1.829 m), weight 68 kg (150 lb), SpO2 91 %.Body mass index is 20.34 kg/m.  General Appearance: Well Groomed, Caucasian, tall, elderly man with a hospital gown and grey hair.   Eye  Contact:  Good  Speech:  Normal Rate  Volume:  Normal  Mood:  Depressed  Affect:  Full Range and dysphoric when speaking about is recent stressors.   Thought Process:  Goal Directed and Linear  Orientation:  Full (Time, Place, and Person)  Thought Content:  Logical  Suicidal Thoughts:  No  Homicidal Thoughts:  No  Memory:  Immediate;   Good Recent;   Good Remote;   Good  Judgement:  Good  Insight:  Good  Psychomotor Activity:  Normal  Concentration:  Concentration: Good and Attention Span: Good  Recall:  Good  Fund of Knowledge:  Good  Language:  Good  Akathisia:  No  Handed:  Left  AIMS (if indicated):     Assets:  Agricultural consultant Housing Social Support  ADL's:  Intact  Cognition:  WNL  Sleep:   Okay   Assessment: Mr. Christopher Fowler is admitted with aspiration pneumonia and psychiatry was consulted for suicide risk assessment and depression. He endorses hopelessness about his current situation and is  grieving the death of his wife. He also reports anhedonia and poor concentration. He denies SI and reports that his family is his reason for living. He does not warrant inpatient psychiatric hospitalization. He may benefit from increasing Effexor for symptom management.   Treatment Plan Summary: -Increase Effexor XR 75 mg daily to 150 mg daily for depression and anxiety. Antidepressants may cause hyponatremia but patient's current low sodium is likely secondary to pneumonia but is trending up with treatment.  -Spoke to patient's outpatient psychiatrist, [name] who is also in agreement with increasing Effexor.  -Patient does not require inpatient psychiatric hospitalization. He will follow up with his outpatient psychiatrist following discharge.  -Will sign off on patient at this time. Please consult psychiatry as needed.   Disposition: No evidence of imminent risk to self or others at present.   Patient does not meet criteria for  psychiatric inpatient admission. Supportive therapy provided about ongoing stressors.  Faythe Dingwall, DO 01/26/2017 11:40 AM

## 2017-01-26 NOTE — Care Management Note (Signed)
Case Management Note  Patient Details  Name: Christopher Fowler. MRN: [id number] Date of Birth: 12/01/1924  Subjective/Objective:                  Low sodium and confusion  Action/Plan: Date:  January 26, 2017 Chart reviewed for concurrent status and case management needs.  Will continue to follow patient progress.  Discharge Planning: following for needs  Expected discharge date: January 29, 2017  Velva Harman, BSN, [place], [place]   Expected Discharge Date:   (unknown)               Expected Discharge Plan:  Home/Self Care  In-House Referral:     Discharge planning Services  CM Consult  Post Acute Care Choice:    Choice offered to:     DME Arranged:    DME Agency:     HH Arranged:    HH Agency:     Status of Service:  In process, will continue to follow  If discussed at Long Length of Stay Meetings, dates discussed:    Additional Comments:  Leeroy Cha, RN 01/26/2017, 9:26 AM

## 2017-01-26 NOTE — Clinical Social Work Note (Signed)
Clinical Social Work Assessment  Patient Details  Name: Christopher Fowler. MRN: [id number] Date of Birth: 05/17/1924  Date of referral:  01/26/17               Reason for consult:  Discharge Planning                Permission sought to share information with:  Case Manager, Facility Sport and exercise psychologist, Family Supports Permission granted to share information::     Name::      Probation officer::     Relationship::   Daughter  Contact Information:     Housing/Transportation Living arrangements for the past 2 months:  [place] of Information:  Adult Children Patient Interpreter Needed:  None Criminal Activity/Legal Involvement Pertinent to Current Situation/Hospitalization:  No - Comment as needed Significant Relationships:  Adult Children, Warehouse manager Lives with:  Facility Resident Do you feel safe going back to the place where you live?  Yes Need for family participation in patient care:  Yes (Comment)  Care giving concerns:  No care giving concerns at the time of assessment.   Social Worker assessment / plan:  LCSW following for return to ALF.  Patient was admitted to the hospital for FTT, dehydration, hyponatremia, depression. Christopher Fowler is a 83 y.o. male with history of neurogenic bladder doing self-catheterization for several years.  History of CLL reported in remission . history of depression.  he is sent from GI office to ED due to concerning failure to thrive, dehydration, weight loss ,hyponatremia and worsening of depression.  According to his daughter, Christopher Fowler been in assisted living at [facility] for the past 2 weeks. Previously he was was living in [place] living at [facility] with his wife of 110 years, who passed four months ago.   Daughter reported that patient is followed in the community by Christopher Fowler for med management. He follows up every 3 months, but reports that he has been  more frequently considering his recent loss and transition in living situation.   Leda Gauze also stated that though patient has a history of depression it has been managed by meds and he has never been hospitalized due to his depression.   PLAN: Anticipate patient will return to ALF unless higher level of care is recommended.   Employment status:  Retired Forensic scientist:  Medicare PT Recommendations:  Not assessed at this time Information / Referral to community resources:     Patient/Family's Response to care: Family is is supportive and involved in patients care.   Patient/Family's Understanding of and Emotional Response to Diagnosis, Current Treatment, and Prognosis:  Family is understanding of patients diagnosis and agreeable to treatment plan.   Emotional Assessment Appearance:  Appears stated age Attitude/Demeanor/Rapport:  Unable to Assess Affect (typically observed):  Unable to Assess Orientation:  Oriented to Self, Oriented to Place, Oriented to  Time, Oriented to Situation Alcohol / Substance use:  Not Applicable Psych involvement (Current and /or in the community):  (S) Outpatient Provider (Followed by [name] for med management)  Discharge Needs  Concerns to be addressed:  Mental Health Concerns Readmission within the last 30 days:  No Current discharge risk:  None Barriers to Discharge:  Continued Medical Work up   [employer], LCSW 01/26/2017, 11:05 AM

## 2017-01-26 NOTE — Progress Notes (Addendum)
[facility] PROGRESS NOTE  Christopher Fowler. XNA:[id number] DOB: [birth year] DOA: 01/25/2017  PCP: Christopher Manes, MD  Brief History/Interval Summary: A 83 year old Caucasian male with past medical history of neurogenic bladder who self catheterizes, history of CLL in remission, history of depression who was sent over from gastroenterology office for dehydration weight loss hyponatremia.  Patient was hospitalized for further management.  There was also some concern for worsening depression.  Reason for Visit: pneumonia  Consultants: Oncology.  Psychiatry  Procedures: None  Antibiotics: Unasyn  Subjective/Interval History: Patient complains of a cough with yellowish expectoration.  Denies any blood in the sputum.  Some nausea but no vomiting.  Reports poor appetite.  ROS: Denies any abdominal pain  Objective:  Vital Signs  Vitals:   01/25/17 1643 01/25/17 1723 01/25/17 2018 01/26/17 0537  BP: (!) 144/72 (!) 143/68 (!) 142/63 (!) 141/75  Pulse: 77 88 97 (!) 105  Resp: 18 18 13 15   Temp: 98.1 F (36.7 C) 98.3 F (36.8 C) 98.5 F (36.9 C) (!) 100.7 F (38.2 C)  TempSrc: Oral Oral Oral Oral  SpO2: 96% 100% 99% 91%  Weight:  68 kg (150 lb)    Height:  6' (1.829 m)      Intake/Output Summary (Last 24 hours) at 01/26/17 1300 Last data filed at 01/26/17 1000  Gross per 24 hour  Intake           2992.5 ml  Output             1900 ml  Net           1092.5 ml   Filed Weights   01/25/17 1723  Weight: 68 kg (150 lb)    General appearance: alert, cooperative, appears stated age and no distress Head: Normocephalic, without obvious abnormality, atraumatic Resp: Crackles noted right base.  No wheezing.  Normal effort. Cardio: regular rate and rhythm, S1, S2 normal, no murmur, click, rub or gallop GI: soft, non-tender; bowel sounds normal; no masses,  no organomegaly Extremities: extremities normal, atraumatic, no cyanosis or edema Neurologic: Awake alert.  Somewhat  of a flat affect.  No focal neurological deficits noted.  Lab Results:  Data Reviewed: I have personally reviewed following labs and imaging studies  CBC:  Recent Labs Lab 01/24/17 1048 01/25/17 1349 01/25/17 1407 01/26/17 0556  WBC 21.0 Repeated and verified X2.* 17.4*  --  15.6*  NEUTROABS 18.4* 14.5*  --  13.2*  HGB 11.8* 10.2* 9.9* 10.1*  HCT 35.1* 30.0* 29.0* 29.5*  MCV 87.2 85.0  --  85.0  PLT 522.0* 408*  --  436*    Basic Metabolic Panel:  Recent Labs Lab 01/24/17 1048 01/25/17 1349 01/25/17 1407 01/26/17 0556  NA 122* 125* 125* 128*  K 3.8 3.6 3.4* 3.6  CL 84* 88* 85* 92*  CO2 32 29  --  26  GLUCOSE 108* 109* 104* 102*  BUN 20 19 17 17   CREATININE 0.73 0.78 0.80 0.74  CALCIUM 8.6 7.9*  --  7.8*  MG  --   --   --  1.7    GFR: Estimated Creatinine Clearance: 59 mL/min (by C-G formula based on SCr of 0.74 mg/dL).  Liver Function Tests:  Recent Labs Lab 01/24/17 1048 01/25/17 1349 01/26/17 0556  AST 47* 46* 45*  ALT 41 38 37  ALKPHOS 86 85 83  BILITOT 1.1 1.0 0.9  PROT 6.4 5.7* 5.4*  ALBUMIN 3.3* 2.6* 2.5*    Thyroid Function Tests:  Recent  Labs  01/26/17 0556  TSH 1.108    Anemia Panel:  Recent Labs  01/26/17 0556  TIBC 168*  IRON 15*    Recent Results (from the past 240 hour(s))  MRSA PCR Screening     Status: None   Collection Time: 01/25/17  5:47 PM  Result Value Ref Range Status   MRSA by PCR NEGATIVE NEGATIVE Final    Comment:        The GeneXpert MRSA Assay (FDA approved for NASAL specimens only), is one component of a comprehensive MRSA colonization surveillance program. It is not intended to diagnose MRSA infection nor to guide or monitor treatment for MRSA infections.       Radiology Studies: Dg Chest 2 View  Result Date: 01/25/2017 CLINICAL DATA:  Cough. EXAM: CHEST  2 VIEW COMPARISON:  Radiographs of January 05, 2017. PET scan of August 25, 2016. FINDINGS: The heart size and mediastinal contours are within  normal limits. No pneumothorax or pleural effusion is noted. Left lung is clear. Increased right lower lobe opacity is noted. The visualized skeletal structures are unremarkable. IMPRESSION: Right lower lobe opacity noted on prior exam has increased in size. While this may simply represent worsening pneumonia, CT scan of the chest is recommended to evaluate for possible underlying neoplasm or malignancy. Electronically Signed   By: Marijo Conception, M.D.   On: 01/25/2017 16:44     Medications:  Scheduled: . aspirin EC  81 mg Oral Daily  . enoxaparin (LOVENOX) injection  40 mg Subcutaneous Q24H  . famotidine  10 mg Oral QHS  . feeding supplement (ENSURE ENLIVE)  237 mL Oral TID BM  . finasteride  5 mg Oral Daily  . fluticasone  1 spray Each Nare Daily  . loratadine  10 mg Oral Daily  . montelukast  10 mg Oral QHS  . pantoprazole  40 mg Oral Daily  . polyethylene glycol  17 g Oral BID  . senna-docusate  1 tablet Oral BID  . temazepam  30 mg Oral QHS  . venlafaxine XR  75 mg Oral Q breakfast   Continuous: . sodium chloride 125 mL/hr at 01/25/17 2359  . ampicillin-sulbactam (UNASYN) IV Stopped (01/26/17 1000)   PRN:  Assessment/Plan:    Recurrent pneumonia Patient had abnormal chest x-ray findings in October.  Patient does have a cough.  X-ray again showed area of concern in the right lower lobe.  Agree with treating with antibiotics for now.  However area also looks concerning for mass according to radiology.  Agree with CT scan of the chest which has been ordered by oncology.  Leukocytosis Thought to be mainly due to infection rather than hematological or oncological process.  Continue to monitor for now.  History of CLL in remission Followed by oncology.  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  Slightly better today compared to yesterday.  Continue to monitor.  Continue IV fluids but lower the rate.  Normocytic  anemia/thrombocytosis No overt bleeding.  Hemoglobin is stable.  Continue to monitor.  History of depression Apparently his wife passed away this past summer.  He has been depressed ever since.  He is on antidepressants which will be resumed.  Patient mentioned suicidal ideation yesterday though not currently.  He is followed by psychiatry as an outpatient. Inpatient psychiatry has been consulted.  TSH is normal.  This is most likely his reason for failure to thrive.  PT evaluation.  Mildly elevated AST Significance is unclear.  Outpatient  monitoring.  Severe protein calorie malnutrition Encourage oral intake.  Constipation Continue laxatives.  History of neurogenic bladder Patient is followed by Dr. Dutch Gray with urology.  He self catheterizes at home and has been doing so for many years.  Has a Foley catheter currently.   DVT Prophylaxis: Lovenox    Code Status: DNR Family Communication: No family at bedside Disposition Plan: Management as outlined above.    LOS: 1 day   [facility] Pager [phone] 01/26/2017, 1:00 PM  If 7PM-7AM, please contact night-coverage at [email or web address], password [facility]

## 2017-01-26 NOTE — Evaluation (Signed)
Physical Therapy Evaluation Patient Details Name: Christopher Fowler. MRN: [id number] DOB: June 22, 1924 Today's Date: 01/26/2017   History of Present Illness  83 yo male admitted with FTT, hyponatremia, depression, leukocytosis. Hx of CLL, DJD, neurogenic bladder  Clinical Impression  On eval, pt was Min guard assist for mobility. He walked ~400 feet with a RW. No LOB with RW use. He reports he has been using a walker over the last few months. Pt appears sad and he expressed overall unhappiness about everything during the PT session. Pt could possibly benefit from psych and/or chaplain consult(s) if MD agrees. Will follow during hospital stay.     Follow Up Recommendations No PT follow up;Supervision/Assistance - 24 hour    Equipment Recommendations  None recommended by PT    Recommendations for Other Services       Precautions / Restrictions Precautions Precautions: Fall Restrictions Weight Bearing Restrictions: No      Mobility  Bed Mobility Overal bed mobility: Needs Assistance Bed Mobility: Supine to Sit;Sit to Supine     Supine to sit: Modified independent (Device/Increase time);HOB elevated Sit to supine: Modified independent (Device/Increase time);HOB elevated      Transfers Overall transfer level: Needs assistance Equipment used: Rolling walker (2 wheeled) Transfers: Sit to/from Stand Sit to Stand: Supervision         General transfer comment: for safety. cues for hand placement  Ambulation/Gait Ambulation/Gait assistance: Min guard Ambulation Distance (Feet): 400 Feet Assistive device: Rolling walker (2 wheeled) Gait Pattern/deviations: Step-through pattern;Decreased stride length;Trunk flexed     General Gait Details: close guard for safety. No LOB with RW use. Pt tolerated distance well.   Stairs            Wheelchair Mobility    Modified Rankin (Stroke Patients Only)       Balance                                              Pertinent Vitals/Pain Pain Assessment: No/denies pain    Home Living Family/patient expects to be discharged to:: Assisted living               Home Equipment: Walker - 4 wheels      Prior Function Level of Independence: Independent with assistive device(s)         Comments: uses rollator for safety     Hand Dominance        Extremity/Trunk Assessment   Upper Extremity Assessment Upper Extremity Assessment: Generalized weakness    Lower Extremity Assessment Lower Extremity Assessment: Generalized weakness    Cervical / Trunk Assessment Cervical / Trunk Assessment: Kyphotic  Communication   Communication: No difficulties  Cognition Arousal/Alertness: Awake/alert Behavior During Therapy: Flat affect Overall Cognitive Status: Within Functional Limits for tasks assessed                                 General Comments: pt is sad. He could possibly benefit from psych and chaplain consult(s)      General Comments      Exercises     Assessment/Plan    PT Assessment Patient needs continued PT services  PT Problem List Decreased mobility       PT Treatment Interventions Gait training;Therapeutic activities;Therapeutic exercise;Patient/family education;Functional mobility training    PT Goals (Current  goals can be found in the Care Plan section)  Acute Rehab PT Goals Patient Stated Goal: pt appears sad/unhappy. PT Goal Formulation: With patient Time For Goal Achievement: 02/09/17 Potential to Achieve Goals: Fair    Frequency Min 3X/week   Barriers to discharge        Co-evaluation               AM-PAC PT "6 Clicks" Daily Activity  Outcome Measure Difficulty turning over in bed (including adjusting bedclothes, sheets and blankets)?: None Difficulty moving from lying on back to sitting on the side of the bed? : None Difficulty sitting down on and standing up from a chair with arms (e.g., wheelchair, bedside  commode, etc,.)?: A Little Help needed moving to and from a bed to chair (including a wheelchair)?: A Little Help needed walking in hospital room?: A Little Help needed climbing 3-5 steps with a railing? : A Little 6 Click Score: 20    End of Session Equipment Utilized During Treatment: Gait belt Activity Tolerance: Patient tolerated treatment well Patient left: in bed;with call bell/phone within reach;with bed alarm set   PT Visit Diagnosis: Muscle weakness (generalized) (M62.81);Difficulty in walking, not elsewhere classified (R26.2)    Time: 9166-0600 PT Time Calculation (min) (ACUTE ONLY): 20 min   Charges:   PT Evaluation $PT Eval Moderate Complexity: 1 Mod     PT G Codes:          Weston Anna, MPT Pager: [phone]

## 2017-01-26 NOTE — Telephone Encounter (Signed)
Patient was called for follow up after his procedure yesterday. No answer. I was not able to leave a message.   Riki Sheer, LPN

## 2017-01-27 LAB — CBC WITH DIFFERENTIAL/PLATELET
BASOS ABS: 0 10*3/uL (ref 0.0–0.1)
Basophils Relative: 0 %
Eosinophils Absolute: 0.1 10*3/uL (ref 0.0–0.7)
Eosinophils Relative: 1 %
HEMATOCRIT: 30 % — AB (ref 39.0–52.0)
Hemoglobin: 10.3 g/dL — ABNORMAL LOW (ref 13.0–17.0)
LYMPHS ABS: 1.3 10*3/uL (ref 0.7–4.0)
LYMPHS PCT: 11 %
MCH: 29.2 pg (ref 26.0–34.0)
MCHC: 34.3 g/dL (ref 30.0–36.0)
MCV: 85 fL (ref 78.0–100.0)
MONO ABS: 1.2 10*3/uL — AB (ref 0.1–1.0)
Monocytes Relative: 10 %
NEUTROS ABS: 9.6 10*3/uL — AB (ref 1.7–7.7)
Neutrophils Relative %: 78 %
Platelets: 453 10*3/uL — ABNORMAL HIGH (ref 150–400)
RBC: 3.53 MIL/uL — ABNORMAL LOW (ref 4.22–5.81)
RDW: 13.9 % (ref 11.5–15.5)
WBC: 12.2 10*3/uL — ABNORMAL HIGH (ref 4.0–10.5)

## 2017-01-27 LAB — COMPREHENSIVE METABOLIC PANEL
ALT: 48 U/L (ref 17–63)
AST: 60 U/L — AB (ref 15–41)
Albumin: 2.3 g/dL — ABNORMAL LOW (ref 3.5–5.0)
Alkaline Phosphatase: 82 U/L (ref 38–126)
Anion gap: 9 (ref 5–15)
BILIRUBIN TOTAL: 0.8 mg/dL (ref 0.3–1.2)
BUN: 17 mg/dL (ref 6–20)
CO2: 28 mmol/L (ref 22–32)
CREATININE: 0.72 mg/dL (ref 0.61–1.24)
Calcium: 7.7 mg/dL — ABNORMAL LOW (ref 8.9–10.3)
Chloride: 92 mmol/L — ABNORMAL LOW (ref 101–111)
GFR calc Af Amer: >60 mL/min (ref >60)
Glucose, Bld: 101 mg/dL — ABNORMAL HIGH (ref 65–99)
POTASSIUM: 3.4 mmol/L — AB (ref 3.5–5.1)
Sodium: 129 mmol/L — ABNORMAL LOW (ref 135–145)
TOTAL PROTEIN: 5.2 g/dL — AB (ref 6.5–8.1)

## 2017-01-27 MED ORDER — FAMOTIDINE 20 MG PO TABS
20.0000 mg | ORAL_TABLET | Freq: Two times a day (BID) | ORAL | Status: DC
  Administered 2017-01-27 – 2017-01-29 (×4): 20 mg via ORAL
  Filled 2017-01-27 (×4): qty 1

## 2017-01-27 MED ORDER — PANTOPRAZOLE SODIUM 40 MG PO TBEC
40.0000 mg | DELAYED_RELEASE_TABLET | Freq: Two times a day (BID) | ORAL | Status: DC
  Administered 2017-01-27 – 2017-01-29 (×4): 40 mg via ORAL
  Filled 2017-01-27 (×5): qty 1

## 2017-01-27 MED ORDER — GUAIFENESIN ER 600 MG PO TB12
600.0000 mg | ORAL_TABLET | Freq: Two times a day (BID) | ORAL | Status: DC
  Administered 2017-01-27 – 2017-01-29 (×5): 600 mg via ORAL
  Filled 2017-01-27 (×5): qty 1

## 2017-01-27 MED ORDER — POTASSIUM CHLORIDE CRYS ER 20 MEQ PO TBCR
40.0000 meq | EXTENDED_RELEASE_TABLET | Freq: Once | ORAL | Status: AC
  Administered 2017-01-27: 40 meq via ORAL
  Filled 2017-01-27: qty 2

## 2017-01-27 NOTE — Clinical Social Work Note (Signed)
CSW received a call from staff person Kalman Shan with [facility] following up on patient. Brief update provided and Rose informed that CSW not sure when patient will be ready for discharge. She asked to be contacted [phone]) when patient ready for discharge. Patient's CSW will continue to follow and advise facility when patient ready for discharge.  Dawt Reeb Givens, MSW, LCSW Licensed Clinical Social Worker [place] [phone]

## 2017-01-27 NOTE — Evaluation (Signed)
Clinical/Bedside Swallow Evaluation Patient Details  Name: Christopher Fowler. MRN: [id number] Date of Birth: September 23, 1924  Today's Date: 01/27/2017 Time: SLP Start Time (ACUTE ONLY): 29 SLP Stop Time (ACUTE ONLY): 1538 SLP Time Calculation (min) (ACUTE ONLY): 34 min  Past Medical History:  Past Medical History:  Diagnosis Date  . BPH (benign prostatic hyperplasia)   . CLL (chronic lymphocytic leukemia) ([place])   . Degenerative joint disease (DJD) of hip    hip  . DJD (degenerative joint disease) of cervical spine   . DJD (degenerative joint disease) of knee    left  . Elevated MCV   . Gilbert's syndrome   . Hernia, inguinal    left  . Neurogenic bladder   . Pancreatic cyst    2cm negative needle aspiration  . Spermatocele    right  . 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 BACK SURGERY     L4 herniated disk  . COLONOSCOPY    . EUS  2007   HPI:  Christopher Fowleris a 83 y.o.malewith a history of neurogenic bladder doing self-catheterization for several years. History of CLL reported in remission, history of depression. Pt is sent from GI office to ED due to concerning failure to thrive,dehydration,weight loss ,hyponatremia and worsening of depression. CT of chest revealed Dense right lower lobe consolidation. Given combination of   Assessment / Plan / Recommendation Clinical Impression  Pt presents with a suspected primary esophageal dysphagia. Note hx of GERD. Oropharyngeal phases of the swallow suspected to be intact. No overt s/sx of aspiration with any consistencies this date. Pt does describe worsening reflux like symptoms where he is able to taste "stomach like contents with burning in the back of his throat periodically". Suspect episodic aspiration from GERD. Further esophageal workup may be indicated per MD discretion. Continue regular thin liquid diet with reflux and aspiration  precautions. ST to continue to monitor.   SLP Visit Diagnosis: Dysphagia, unspecified (R13.10)    Aspiration Risk  Mild aspiration risk;Moderate aspiration risk    Diet Recommendation   Regular, thin liquids   Medication Administration: Whole meds with liquid    Other  Recommendations Recommended Consults: Consider esophageal assessment Oral Care Recommendations: Oral care BID   Follow up Recommendations Other (comment) (ALF )      Frequency and Duration min 1 x/week  1 week       Prognosis Prognosis for Safe Diet Advancement: Good      Swallow Study   General Date of Onset: 01/25/17 HPI: Christopher Fowleris a 83 y.o.malewith a history of neurogenic bladder doing self-catheterization for several years. History of CLL reported in remission, history of depression. Pt is sent from GI office to ED due to concerning failure to thrive,dehydration,weight loss ,hyponatremia and worsening of depression. CT of chest revealed Dense right lower lobe consolidation. Given combination of Type of Study: Bedside Swallow Evaluation Previous Swallow Assessment: none on file Diet Prior to this Study: Regular;Thin liquids Temperature Spikes Noted: No Respiratory Status: Room air History of Recent Intubation: No Behavior/Cognition: Alert;Cooperative;Pleasant mood Oral Cavity Assessment: Within Functional Limits Vision: Functional for self-feeding Self-Feeding Abilities: Able to feed self Patient Positioning: Upright in chair Baseline Vocal Quality: Normal Volitional Cough: Strong Volitional Swallow: Able to elicit    Oral/Motor/Sensory Function Overall Oral Motor/Sensory Function: Within functional limits   Ice Chips Ice chips: Within functional limits   Thin Liquid Thin Liquid: Impaired  Pharyngeal  Phase Impairments: Multiple swallows    Nectar Thick Nectar Thick Liquid: Not tested   Honey Thick Honey Thick Liquid: Not tested   Puree Puree: Impaired Presentation: Self  Fed Pharyngeal Phase Impairments: Multiple swallows;Cough - Delayed   Solid   GO   Solid: Within functional limits       Christopher Chaco MA, CCC-SLP Acute Care Speech Language Pathologist    Levi Aland 01/27/2017,3:41 PM

## 2017-01-27 NOTE — Evaluation (Signed)
Occupational Therapy Evaluation Patient Details Name: Christopher Fowler. MRN: [id number] DOB: 02/19/1925 Today's Date: 01/27/2017    History of Present Illness 83 yo male admitted with FTT, hyponatremia, depression, leukocytosis. Hx of CLL, DJD, neurogenic bladder   Clinical Impression   Pt educated on safety with functional transfers and safe walker use. Discussed need to continue to move to keep from getting stiff and getting weaker. Pt's daughter present for session. Also discussed safety with rollator use which is what he has at home. Will continue to follow to progress ADL independence.    Follow Up Recommendations  No OT follow up    Equipment Recommendations  None recommended by OT    Recommendations for Other Services       Precautions / Restrictions Precautions Precautions: Fall Restrictions Weight Bearing Restrictions: No      Mobility Bed Mobility Overal bed mobility: Modified Independent Bed Mobility: Supine to Sit              Transfers Overall transfer level: Needs assistance Equipment used: Rolling walker (2 wheeled) Transfers: Sit to/from Stand Sit to Stand: Supervision         General transfer comment: cues for hand placement as pt tending to pull up with walker.    Balance                                           ADL either performed or assessed with clinical judgement   ADL Overall ADL's : Needs assistance/impaired Eating/Feeding: Independent;Sitting   Grooming: Wash/dry hands;Set up;Sitting   Upper Body Bathing: Set up;Sitting   Lower Body Bathing: Min guard;Sit to/from stand   Upper Body Dressing : Set up;Sitting   Lower Body Dressing: Min guard;Sit to/from stand   Toilet Transfer: Min guard;Ambulation;RW   Toileting- Water quality scientist and Hygiene: Min guard;Sit to/from stand         General ADL Comments: Discussed safety with functional transfers at length. Reinforced to not pull up on walker but  rather push up from EOB or armrest of chair or use grab bar. Pt has a rollator at home and discussed safe use of rollator including use of brakes when standing. Pt stood to groom at the sink for several minutes with no LOB.      Vision Patient Visual Report: No change from baseline       Perception     Praxis      Pertinent Vitals/Pain Pain Assessment: No/denies pain     Hand Dominance     Extremity/Trunk Assessment Upper Extremity Assessment Upper Extremity Assessment: Overall WFL for tasks assessed           Communication Communication Communication: No difficulties   Cognition Arousal/Alertness: Awake/alert Behavior During Therapy: WFL for tasks assessed/performed Overall Cognitive Status: Within Functional Limits for tasks assessed                                     General Comments       Exercises     Shoulder Instructions      Home Living Family/patient expects to be discharged to:: Assisted living                             Home Equipment: Walker - 4  wheels          Prior Functioning/Environment Level of Independence: Independent with assistive device(s);Needs assistance    ADL's / Homemaking Assistance Needed: has help for laundry. uses walker to go to meals. doesnt use rollator in the room. does own bath, dressing.   Comments: uses rollator for safety        OT Problem List: Decreased strength;Decreased knowledge of use of DME or AE      OT Treatment/Interventions: Self-care/ADL training;Patient/family education;DME and/or AE instruction;Therapeutic activities    OT Goals(Current goals can be found in the care plan section) Acute Rehab OT Goals Patient Stated Goal: wants to get up and move more. OT Goal Formulation: With patient Time For Goal Achievement: 02/10/17 Potential to Achieve Goals: Good  OT Frequency: Min 2X/week   Barriers to D/C:            Co-evaluation              AM-PAC PT "6  Clicks" Daily Activity     Outcome Measure Help from another person eating meals?: None Help from another person taking care of personal grooming?: A Little Help from another person toileting, which includes using toliet, bedpan, or urinal?: A Little Help from another person bathing (including washing, rinsing, drying)?: A Little Help from another person to put on and taking off regular upper body clothing?: None Help from another person to put on and taking off regular lower body clothing?: A Little 6 Click Score: 20   End of Session Equipment Utilized During Treatment: Rolling walker  Activity Tolerance: Patient tolerated treatment well Patient left: in chair;with call bell/phone within reach;with family/visitor present  OT Visit Diagnosis: Muscle weakness (generalized) (M62.81)                Time: 6811-5726 OT Time Calculation (min): 28 min Charges:  OT General Charges $OT Visit: 1 Visit OT Evaluation $OT Eval Low Complexity: 1 Low OT Treatments $Therapeutic Activity: 8-22 mins G-Codes:       Jae Dire Laylonie Marzec 01/27/2017, 3:09 PM

## 2017-01-27 NOTE — Progress Notes (Signed)
[facility] PROGRESS NOTE  Christopher Fowler. [id number]:[id number] DOB: 1925/03/04 DOA: 01/25/2017  PCP: Lajean Manes, MD  Brief History/Interval Summary: A 83 year old Caucasian male with past medical history of neurogenic bladder who self catheterizes, history of CLL in remission, history of depression who was sent over from gastroenterology office for dehydration weight loss hyponatremia.  Patient was hospitalized for further management.  There was also some concern for worsening depression.  Reason for Visit: pneumonia  Consultants: Oncology.  Psychiatry  Procedures: None  Antibiotics: Unasyn  Subjective/Interval History: Patient feels about the same as yesterday.  Continues to have a cough with yellowish expectoration.  Denies any chest pain.  Appetite seems to be improving some.  No nausea or vomiting.  Does admit to severe reflux.  ROS: Denies any abdominal pain.  Objective:  Vital Signs  Vitals:   01/26/17 0537 01/26/17 1459 01/26/17 2148 01/27/17 0619  BP: (!) 141/75 136/73 (!) 158/82 (!) 148/78  Pulse: (!) 105 88 94 91  Resp: 15 18 16 18   Temp: (!) 100.7 F (38.2 C) 98.3 F (36.8 C) 98.3 F (36.8 C) 98.3 F (36.8 C)  TempSrc: Oral Oral Oral Oral  SpO2: 91% 96% 97% 95%  Weight:      Height:        Intake/Output Summary (Last 24 hours) at 01/27/17 1007 Last data filed at 01/27/17 0815  Gross per 24 hour  Intake             1095 ml  Output             3500 ml  Net            -2405 ml   Filed Weights   01/25/17 1723  Weight: 68 kg (150 lb)    General appearance: Awake alert.  In no distress Resp: Continues to have crackles at the right base without any wheezing or rhonchi.  Normal effort. Cardio: S1-S2 is normal regular.  No S3-S4.  No rubs murmurs or bruit GI: Abdomen is soft.  Nontender nondistended.  Bowel sounds are present.  No masses or organ Extremities: No edema Neurologic: Awake and alert.  Oriented x3.  No focal neurological  deficits  Lab Results:  Data Reviewed: I have personally reviewed following labs and imaging studies  CBC:  Recent Labs Lab 01/24/17 1048 01/25/17 1349 01/25/17 1407 01/26/17 0556 01/27/17 0713  WBC 21.0 Repeated and verified X2.* 17.4*  --  15.6* 12.2*  NEUTROABS 18.4* 14.5*  --  13.2* 9.6*  HGB 11.8* 10.2* 9.9* 10.1* 10.3*  HCT 35.1* 30.0* 29.0* 29.5* 30.0*  MCV 87.2 85.0  --  85.0 85.0  PLT 522.0* 408*  --  436* 453*    Basic Metabolic Panel:  Recent Labs Lab 01/24/17 1048 01/25/17 1349 01/25/17 1407 01/26/17 0556 01/27/17 0713  NA 122* 125* 125* 128* 129*  K 3.8 3.6 3.4* 3.6 3.4*  CL 84* 88* 85* 92* 92*  CO2 32 29  --  26 28  GLUCOSE 108* 109* 104* 102* 101*  BUN 20 19 17 17 17   CREATININE 0.73 0.78 0.80 0.74 0.72  CALCIUM 8.6 7.9*  --  7.8* 7.7*  MG  --   --   --  1.7  --     GFR: Estimated Creatinine Clearance: 59 mL/min (by C-G formula based on SCr of 0.72 mg/dL).  Liver Function Tests:  Recent Labs Lab 01/24/17 1048 01/25/17 1349 01/26/17 0556 01/27/17 0713  AST 47* 46* 45* 60*  ALT  41 38 37 48  ALKPHOS 86 85 83 82  BILITOT 1.1 1.0 0.9 0.8  PROT 6.4 5.7* 5.4* 5.2*  ALBUMIN 3.3* 2.6* 2.5* 2.3*    Thyroid Function Tests:  Recent Labs  01/26/17 0556  TSH 1.108    Anemia Panel:  Recent Labs  01/26/17 0556  TIBC 168*  IRON 15*    Recent Results (from the past 240 hour(s))  MRSA PCR Screening     Status: None   Collection Time: 01/25/17  5:47 PM  Result Value Ref Range Status   MRSA by PCR NEGATIVE NEGATIVE Final    Comment:        The GeneXpert MRSA Assay (FDA approved for NASAL specimens only), is one component of a comprehensive MRSA colonization surveillance program. It is not intended to diagnose MRSA infection nor to guide or monitor treatment for MRSA infections.       Radiology Studies: Dg Chest 2 View  Result Date: 01/25/2017 CLINICAL DATA:  Cough. EXAM: CHEST  2 VIEW COMPARISON:  Radiographs of January 05, 2017. PET scan of August 25, 2016. FINDINGS: The heart size and mediastinal contours are within normal limits. No pneumothorax or pleural effusion is noted. Left lung is clear. Increased right lower lobe opacity is noted. The visualized skeletal structures are unremarkable. IMPRESSION: Right lower lobe opacity noted on prior exam has increased in size. While this may simply represent worsening pneumonia, CT scan of the chest is recommended to evaluate for possible underlying neoplasm or malignancy. Electronically Signed   By: Marijo Conception, M.D.   On: 01/25/2017 16:44   Ct Chest W Contrast  Result Date: 01/26/2017 CLINICAL DATA:  Cough. Pneumonia. Unresolved. Neurogenic bladder. History of pancreatic cyst. Chronic lymphocytic leukemia. EXAM: CT CHEST WITH CONTRAST TECHNIQUE: Multidetector CT imaging of the chest was performed during intravenous contrast administration. CONTRAST:  45mL ISOVUE-300 IOPAMIDOL (ISOVUE-300) INJECTION 61% COMPARISON:  01/25/2017 chest radiograph.  PET of 08/25/2016. FINDINGS: Cardiovascular: Upper normal ascending aortic size, 3.9 cm. Advanced aortic and branch vessel atherosclerosis. Normal heart size, without pericardial effusion. Multivessel coronary artery atherosclerosis. No central pulmonary embolism, on this non-dedicated study. Mediastinum/Nodes: No supraclavicular adenopathy. No mediastinal adenopathy. Borderline right hilar adenopathy at 1.4 cm is favored to be reactive. Lungs/Pleura: Increase in left and development of small right pleural effusions since the prior PET. Patent airways, including to the right lower lobe to the segmental level. There is debris in the right mainstem bronchus dependently, including on image 67/ series 5. This is new since the prior PET. Right lower lobe consolidation, with relatively few air bronchograms. Foci of increased density within, including on image 117/series 2. Areas of subtle hypoattenuation within, including superiorly laterally on  image 98/ series 2 and posterior medially on image 109/series 2. No underlying nodule or mass in this area on the prior PET. Mild dependent left lower lobe subsegmental atelectasis. A 3 mm left lower lobe pulmonary nodule on image 80/ series 5 is similar to on the prior PET. Upper Abdomen: Scattered too small to characterize liver lesions. Normal imaged portions of the spleen, stomach, adrenal glands, kidneys. Pancreatic tail cystic lesion measures 2.0 cm on image 165/series 2 and demonstrates calcification in its dependent portion. This was relatively similar in size at 1.9 cm back in 2008, most consistent with a benign etiology. Abdominal aortic atherosclerosis. Colonic stool burden suggests constipation. Musculoskeletal: Moderate thoracic spondylosis. IMPRESSION: 1. Dense right lower lobe consolidation. Given combination of extensive hyperattenuation within (possibly contrast) and debris in the  right mainstem bronchus, aspiration is favored. Infection could look similar. Areas of relative hypoattenuation within are suspicious for complicating necrosis. No evidence of underlying central obstructive lesion and no lesion in this area on the PET of 08/25/2016. 2. Right greater than left pleural effusions, small. 3. Coronary artery atherosclerosis. Aortic Atherosclerosis (ICD10-I70.0). 4. Pancreatic tail complex cystic lesion is relatively similar back to 2008 and can be presumed benign in this 83 year old patient. Electronically Signed   By: Abigail Miyamoto M.D.   On: 01/26/2017 16:22     Medications:  Scheduled: . aspirin EC  81 mg Oral Daily  . enoxaparin (LOVENOX) injection  40 mg Subcutaneous Q24H  . famotidine  10 mg Oral QHS  . feeding supplement (ENSURE ENLIVE)  237 mL Oral TID BM  . finasteride  5 mg Oral Daily  . fluticasone  1 spray Each Nare Daily  . guaiFENesin  600 mg Oral BID  . loratadine  10 mg Oral Daily  . montelukast  10 mg Oral QHS  . pantoprazole  40 mg Oral BID  . polyethylene  glycol  17 g Oral BID  . senna-docusate  1 tablet Oral BID  . temazepam  30 mg Oral QHS  . venlafaxine XR  150 mg Oral Q breakfast   Continuous: . sodium chloride 75 mL/hr at 01/27/17 0035  . ampicillin-sulbactam (UNASYN) IV 3 g (01/27/17 7412)   PRN:  Assessment/Plan:    Recurrent pneumonia/aspiration pneumonia Patient had abnormal chest x-ray findings in October.  Patient does have a cough.  X-ray again showed area of concern in the right lower lobe.  Patient underwent CT scan of the chest yesterday which raised concern for dense consolidation in the right lower lung most likely due to aspiration.  Patient does have symptoms of reflux disease which are quite severe .  It is quite possible that GERD is the reason for his aspiration.  He denies any difficulty swallowing otherwise.  Continue Unasyn for now.  Mucinex.  Speech therapy consulted to make sure there is no oropharyngeal component.  WBC is improving.  Leukocytosis Thought to be mainly due to infection rather than hematological or oncological process.  WBC is improving  History of CLL in remission Followed by oncology.  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  Counts are mostly stable to slightly improved.  Continue to monitor.  Okay to stop IV fluids.  Normocytic anemia/thrombocytosis No overt bleeding.  Hemoglobin is stable.  Continue to monitor.  History of depression Apparently his wife passed away this past summer.  He has been depressed ever since.  He is on antidepressants which will be resumed.  Patient mentioned suicidal ideation at the time of admission though not currently.  He is followed by psychiatry as an outpatient ([name]). Inpatient psychiatry has been consulted.  TSH is normal.  Dose of Effexor has been increased by psychiatry.  Seen by physical therapy.  He was able to ambulate about 400 feet with a rolling walker.  Mildly elevated AST Significance is  unclear.  Outpatient monitoring.  Severe protein calorie malnutrition Encourage oral intake.  Constipation Continue laxatives.  History of neurogenic bladder Patient is followed by Dr. Dutch Gray with urology.  He self catheterizes at home and has been doing so for many years.  Has a Foley catheter currently.  DVT Prophylaxis: Lovenox    Code Status: DNR Family Communication: No family at bedside Disposition Plan: Management as outlined above.  Await improvement in his pneumonia.  LOS: 2 days   [place] [facility] Pager [phone] 01/27/2017, 10:07 AM  If 7PM-7AM, please contact night-coverage at [email or web address], password [facility]

## 2017-01-27 NOTE — Progress Notes (Signed)
PHARMACY NOTE -  Unasyn  Pharmacy has been assisting with dosing of Unasyn for recurrent pneumonia/aspiration pneumonia. Dosage remains stable at Unasyn 3g IV 6h and need for further dosage adjustment appears unlikely at present.    Will sign off at this time.  Please reconsult if a change in clinical status warrants re-evaluation of dosage.  Hershal Coria, PharmD, BCPS Pager: [phone] 01/27/2017 2:43 PM

## 2017-01-28 ENCOUNTER — Other Ambulatory Visit: Payer: Self-pay

## 2017-01-28 LAB — BASIC METABOLIC PANEL
Anion gap: 9 (ref 5–15)
BUN: 14 mg/dL (ref 6–20)
CALCIUM: 8.1 mg/dL — AB (ref 8.9–10.3)
CO2: 28 mmol/L (ref 22–32)
Chloride: 90 mmol/L — ABNORMAL LOW (ref 101–111)
Creatinine, Ser: 0.68 mg/dL (ref 0.61–1.24)
GFR calc Af Amer: >60 mL/min (ref >60)
GLUCOSE: 96 mg/dL (ref 65–99)
Potassium: 3.8 mmol/L (ref 3.5–5.1)
Sodium: 127 mmol/L — ABNORMAL LOW (ref 135–145)

## 2017-01-28 LAB — CBC
HEMATOCRIT: 33.2 % — AB (ref 39.0–52.0)
Hemoglobin: 11.2 g/dL — ABNORMAL LOW (ref 13.0–17.0)
MCH: 28.9 pg (ref 26.0–34.0)
MCHC: 33.7 g/dL (ref 30.0–36.0)
MCV: 85.6 fL (ref 78.0–100.0)
PLATELETS: 480 10*3/uL — AB (ref 150–400)
RBC: 3.88 MIL/uL — ABNORMAL LOW (ref 4.22–5.81)
RDW: 13.9 % (ref 11.5–15.5)
WBC: 10.4 10*3/uL (ref 4.0–10.5)

## 2017-01-28 MED ORDER — ALPRAZOLAM 0.25 MG PO TABS
0.2500 mg | ORAL_TABLET | Freq: Once | ORAL | Status: AC
  Administered 2017-01-28: 0.25 mg via ORAL
  Filled 2017-01-28: qty 1

## 2017-01-28 MED ORDER — SACCHAROMYCES BOULARDII 250 MG PO CAPS
250.0000 mg | ORAL_CAPSULE | Freq: Two times a day (BID) | ORAL | Status: DC
  Administered 2017-01-28 – 2017-01-29 (×3): 250 mg via ORAL
  Filled 2017-01-28 (×3): qty 1

## 2017-01-28 NOTE — Progress Notes (Signed)
[facility] PROGRESS NOTE  Christopher Fowler. EXH:[id number] DOB: 01/11/1925 DOA: 01/25/2017  PCP: Lajean Manes, MD  Brief History/Interval Summary: A 83 year old Caucasian male with past medical history of neurogenic bladder who self catheterizes, history of CLL in remission, history of depression who was sent over from gastroenterology office for dehydration weight loss hyponatremia.  Patient was hospitalized for further management.  There was also some concern for worsening depression.  Reason for Visit: pneumonia  Consultants: Oncology.  Psychiatry  Procedures: None  Antibiotics: Unasyn  Subjective/Interval History: Patient slightly anxious this morning.  However he states that his cough has improved.  Denies any chest pain or shortness of breath.  No nausea or vomiting.  No acid reflux overnight.    ROS: Denies any abdominal pain.  Objective:  Vital Signs  Vitals:   01/27/17 1416 01/27/17 2030 01/27/17 2142 01/28/17 0537  BP: (!) 152/78 (!) 160/84 (!) 166/89 126/62  Pulse: 89 90  82  Resp: 18 18  18   Temp: 98.5 F (36.9 C) 98.5 F (36.9 C)  98.3 F (36.8 C)  TempSrc: Oral Oral  Oral  SpO2: 100% 100%  96%  Weight:      Height:        Intake/Output Summary (Last 24 hours) at 01/28/2017 0951 Last data filed at 01/28/2017 0500 Gross per 24 hour  Intake 420 ml  Output 5261 ml  Net -4841 ml   Filed Weights   01/25/17 1723  Weight: 68 kg (150 lb 0 oz)    General appearance: Awake alert.  In no distress. Resp: Improved air entry bilaterally.  No wheezing rales or rhonchi.  Few crackles at the right base.  Normal effort Cardio: S1-S2 is normal regular.  No S3-S4.  No rubs murmurs or bruit GI: Abdomen is soft.  Nontender nondistended.  Bowel sounds are present.  No masses or organomegaly.   Extremities: No edema Neurologic: Awake and alert.  Oriented x3.  No focal neurological deficits  Lab Results:  Data Reviewed: I have personally reviewed  following labs and imaging studies  CBC: Recent Labs  Lab 01/24/17 1048 01/25/17 1349 01/25/17 1407 01/26/17 0556 01/27/17 0713 01/28/17 0714  WBC 21.0 Repeated and verified X2.* 17.4*  --  15.6* 12.2* 10.4  NEUTROABS 18.4* 14.5*  --  13.2* 9.6*  --   HGB 11.8* 10.2* 9.9* 10.1* 10.3* 11.2*  HCT 35.1* 30.0* 29.0* 29.5* 30.0* 33.2*  MCV 87.2 85.0  --  85.0 85.0 85.6  PLT 522.0* 408*  --  436* 453* 480*    Basic Metabolic Panel: Recent Labs  Lab 01/24/17 1048 01/25/17 1349 01/25/17 1407 01/26/17 0556 01/27/17 0713 01/28/17 0714  NA 122* 125* 125* 128* 129* 127*  K 3.8 3.6 3.4* 3.6 3.4* 3.8  CL 84* 88* 85* 92* 92* 90*  CO2 32 29  --  26 28 28   GLUCOSE 108* 109* 104* 102* 101* 96  BUN 20 19 17 17 17 14   CREATININE 0.73 0.78 0.80 0.74 0.72 0.68  CALCIUM 8.6 7.9*  --  7.8* 7.7* 8.1*  MG  --   --   --  1.7  --   --     GFR: Estimated Creatinine Clearance: 59 mL/min (by C-G formula based on SCr of 0.68 mg/dL).  Liver Function Tests: Recent Labs  Lab 01/24/17 1048 01/25/17 1349 01/26/17 0556 01/27/17 0713  AST 47* 46* 45* 60*  ALT 41 38 37 48  ALKPHOS 86 85 83 82  BILITOT 1.1 1.0  0.9 0.8  PROT 6.4 5.7* 5.4* 5.2*  ALBUMIN 3.3* 2.6* 2.5* 2.3*    Thyroid Function Tests: Recent Labs    01/26/17 0556  TSH 1.108    Anemia Panel: Recent Labs    01/26/17 0556  TIBC 168*  IRON 15*    Recent Results (from the past 240 hour(s))  MRSA PCR Screening     Status: None   Collection Time: 01/25/17  5:47 PM  Result Value Ref Range Status   MRSA by PCR NEGATIVE NEGATIVE Final    Comment:        The GeneXpert MRSA Assay (FDA approved for NASAL specimens only), is one component of a comprehensive MRSA colonization surveillance program. It is not intended to diagnose MRSA infection nor to guide or monitor treatment for MRSA infections.       Radiology Studies: Ct Chest W Contrast  Result Date: 01/26/2017 CLINICAL DATA:  Cough. Pneumonia. Unresolved.  Neurogenic bladder. History of pancreatic cyst. Chronic lymphocytic leukemia. EXAM: CT CHEST WITH CONTRAST TECHNIQUE: Multidetector CT imaging of the chest was performed during intravenous contrast administration. CONTRAST:  48mL ISOVUE-300 IOPAMIDOL (ISOVUE-300) INJECTION 61% COMPARISON:  01/25/2017 chest radiograph.  PET of 08/25/2016. FINDINGS: Cardiovascular: Upper normal ascending aortic size, 3.9 cm. Advanced aortic and branch vessel atherosclerosis. Normal heart size, without pericardial effusion. Multivessel coronary artery atherosclerosis. No central pulmonary embolism, on this non-dedicated study. Mediastinum/Nodes: No supraclavicular adenopathy. No mediastinal adenopathy. Borderline right hilar adenopathy at 1.4 cm is favored to be reactive. Lungs/Pleura: Increase in left and development of small right pleural effusions since the prior PET. Patent airways, including to the right lower lobe to the segmental level. There is debris in the right mainstem bronchus dependently, including on image 67/ series 5. This is new since the prior PET. Right lower lobe consolidation, with relatively few air bronchograms. Foci of increased density within, including on image 117/series 2. Areas of subtle hypoattenuation within, including superiorly laterally on image 98/ series 2 and posterior medially on image 109/series 2. No underlying nodule or mass in this area on the prior PET. Mild dependent left lower lobe subsegmental atelectasis. A 3 mm left lower lobe pulmonary nodule on image 80/ series 5 is similar to on the prior PET. Upper Abdomen: Scattered too small to characterize liver lesions. Normal imaged portions of the spleen, stomach, adrenal glands, kidneys. Pancreatic tail cystic lesion measures 2.0 cm on image 165/series 2 and demonstrates calcification in its dependent portion. This was relatively similar in size at 1.9 cm back in 2008, most consistent with a benign etiology. Abdominal aortic atherosclerosis.  Colonic stool burden suggests constipation. Musculoskeletal: Moderate thoracic spondylosis. IMPRESSION: 1. Dense right lower lobe consolidation. Given combination of extensive hyperattenuation within (possibly contrast) and debris in the right mainstem bronchus, aspiration is favored. Infection could look similar. Areas of relative hypoattenuation within are suspicious for complicating necrosis. No evidence of underlying central obstructive lesion and no lesion in this area on the PET of 08/25/2016. 2. Right greater than left pleural effusions, small. 3. Coronary artery atherosclerosis. Aortic Atherosclerosis (ICD10-I70.0). 4. Pancreatic tail complex cystic lesion is relatively similar back to 2008 and can be presumed benign in this 83 year old patient. Electronically Signed   By: Abigail Miyamoto M.D.   On: 01/26/2017 16:22     Medications:  Scheduled: . aspirin EC  81 mg Oral Daily  . enoxaparin (LOVENOX) injection  40 mg Subcutaneous Q24H  . famotidine  20 mg Oral BID  . feeding supplement (ENSURE ENLIVE)  237  mL Oral TID BM  . finasteride  5 mg Oral Daily  . fluticasone  1 spray Each Nare Daily  . guaiFENesin  600 mg Oral BID  . loratadine  10 mg Oral Daily  . montelukast  10 mg Oral QHS  . pantoprazole  40 mg Oral BID  . polyethylene glycol  17 g Oral BID  . senna-docusate  1 tablet Oral BID  . temazepam  30 mg Oral QHS  . venlafaxine XR  150 mg Oral Q breakfast   Continuous: . ampicillin-sulbactam (UNASYN) IV Stopped (01/28/17 8527)   PRN:  Assessment/Plan:  Recurrent pneumonia/aspiration pneumonia Patient had abnormal chest x-ray findings in October.  Patient does have a cough.  X-ray again showed area of concern in the right lower lobe.  Patient underwent CT scan of the chest yesterday which raised concern for dense consolidation in the right lower lung most likely due to aspiration.  Patient does have symptoms of reflux disease which are quite severe .  It is quite possible that  GERD is the reason for his aspiration.  He denies any difficulty swallowing otherwise.  Patient is improving.  Continue Unasyn for another day.  Mucinex.  Seen by speech therapy.  Continue on regular diet.  WBC is now normal.   Leukocytosis Thought to be mainly due to infection rather than hematological or oncological process.  WBC is now normal.  History of CLL in remission Followed by oncology.  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  Counts are mostly stable.  Fluid restriction.  IV fluids were discontinued.  No clear evidence for hypovolemia anymore.  Normocytic anemia/thrombocytosis No overt bleeding.  Hemoglobin is stable.  Continue to monitor.  History of depression Apparently his wife passed away this past summer.  He has been depressed ever since.  Patient mentioned suicidal ideation at the time of admission though not currently.  He is followed by psychiatry as an outpatient ([name]). Inpatient psychiatry is consulted and they have seen the patient.  TSH is normal.  Dose of Effexor has been increased by psychiatry.  Seen by physical therapy.  He was able to ambulate about 400 feet with a rolling walker.  Mildly elevated AST Significance is unclear.  Outpatient monitoring.  Severe protein calorie malnutrition Encourage oral intake.  Constipation Continue laxatives.  History of neurogenic bladder Patient is followed by Dr. Dutch Gray with urology.  He self catheterizes at home and has been doing so for many years.  Has a Foley catheter currently.  DVT Prophylaxis: Lovenox    Code Status: DNR Family Communication: Discussed with the patient.  Discussed with his daughter yesterday. Disposition Plan: Management as outlined above.  Anticipate discharge in 24-48 hours.    LOS: 3 days   [place] [facility] Pager [phone] 01/28/2017, 9:51 AM  If 7PM-7AM, please contact night-coverage at [email or web address], password  [facility]

## 2017-01-28 NOTE — Progress Notes (Signed)
CSW contacted by on-call nurse at [facility] to request a clinical update on the patient. CSW provided brief update and indicated that he had passed a swallow evaluation yesterday as well as completed OT evaluation. CSW to contact on-call nurse [phone]) when patient is ready to discharge.  Laveda Abbe, LCSW Clinical Social Worker [phone] (Weekend coverage)

## 2017-01-29 LAB — BASIC METABOLIC PANEL
Anion gap: 7 (ref 5–15)
BUN: 18 mg/dL (ref 6–20)
CALCIUM: 8.2 mg/dL — AB (ref 8.9–10.3)
CHLORIDE: 95 mmol/L — AB (ref 101–111)
CO2: 29 mmol/L (ref 22–32)
CREATININE: 0.69 mg/dL (ref 0.61–1.24)
GFR calc non Af Amer: >60 mL/min (ref >60)
Glucose, Bld: 98 mg/dL (ref 65–99)
Potassium: 3.5 mmol/L (ref 3.5–5.1)
SODIUM: 131 mmol/L — AB (ref 135–145)

## 2017-01-29 MED ORDER — GUAIFENESIN ER 600 MG PO TB12: 600.0000 mg | ORAL_TABLET | Freq: Two times a day (BID) | ORAL | 0 refills | Status: DC

## 2017-01-29 MED ORDER — OMEPRAZOLE 20 MG PO CPDR: 20.0000 mg | DELAYED_RELEASE_CAPSULE | Freq: Two times a day (BID) | ORAL | 0 refills | Status: DC

## 2017-01-29 MED ORDER — VENLAFAXINE HCL ER 150 MG PO CP24: 150.0000 mg | ORAL_CAPSULE | Freq: Every day | ORAL | 0 refills | Status: DC

## 2017-01-29 MED ORDER — AMOXICILLIN-POT CLAVULANATE 875-125 MG PO TABS
1.0000 | ORAL_TABLET | Freq: Two times a day (BID) | ORAL | Status: DC
  Administered 2017-01-29: 1 via ORAL
  Filled 2017-01-29: qty 1

## 2017-01-29 MED ORDER — SACCHAROMYCES BOULARDII 250 MG PO CAPS: 250.0000 mg | ORAL_CAPSULE | Freq: Two times a day (BID) | ORAL | 0 refills | Status: DC

## 2017-01-29 MED ORDER — AMOXICILLIN-POT CLAVULANATE 875-125 MG PO TABS: 1.0000 | ORAL_TABLET | Freq: Two times a day (BID) | ORAL | 0 refills | Status: AC

## 2017-01-29 MED ORDER — RANITIDINE HCL 150 MG PO TABS: 150.0000 mg | ORAL_TABLET | Freq: Two times a day (BID) | ORAL | 0 refills | Status: DC

## 2017-01-29 NOTE — NC FL2 (Signed)
[place] LEVEL OF CARE SCREENING TOOL     IDENTIFICATION  Patient Name: Christopher Fowler. Birthdate: 1925-01-02 Sex: male Admission Date (Current Location): 01/25/2017  [facility] and [place] Number:  Herbalist and Address:  [facility],  [place] [address], [place]      Provider Number: [id number]  Attending Physician Name and Address:  Bonnielee Haff, MD  Relative Name and Phone Number:       Current Level of Care: Hospital Recommended Level of Care: [place] Prior Approval Number:    Date Approved/Denied:   PASRR Number:    Discharge Plan: (ALF)    Current Diagnoses: Patient Active Problem List   Diagnosis Date Noted  . Recurrent pneumonia 01/26/2017  . MDD (major depressive disorder), single episode, moderate ([place]) 01/26/2017  . Hyponatremia 01/25/2017  . Other constipation 07/31/2016  . Weight loss 07/03/2016  . Hypersensitivity reaction 06/05/2016  . Goals of care, counseling/discussion 05/24/2016  . Lymphoma, small lymphocytic ([place]) 05/04/2016  . Pancytopenia, acquired ([place]) 05/04/2016  . Gilbert's syndrome 10/29/2015  . Anemia due to chronic illness 10/29/2015  . CLL (chronic lymphocytic leukemia) ([place]) 10/28/2015    Orientation RESPIRATION BLADDER Height & Weight     Self, Time, Situation, Place      Weight: 150 lb 0 oz (68 kg) Height:  6' (182.9 cm)  BEHAVIORAL SYMPTOMS/MOOD NEUROLOGICAL BOWEL NUTRITION STATUS        Diet( Regular, thin liquids )  AMBULATORY STATUS COMMUNICATION OF NEEDS Skin                               Personal Care Assistance Level of Assistance  Bathing, Feeding, Dressing           Functional Limitations Info             SPECIAL CARE FACTORS FREQUENCY                       Contractures      Additional Factors Info                  Current Medications (01/29/2017):  This is the current hospital active medication list Current  Facility-Administered Medications  Medication Dose Route Frequency Provider Last Rate Last Dose  . amoxicillin-clavulanate (AUGMENTIN) 875-125 MG per tablet 1 tablet  1 tablet Oral Q12H Bonnielee Haff, MD   1 tablet at 01/29/17 1002  . aspirin EC tablet 81 mg  81 mg Oral Daily Florencia Reasons, MD   81 mg at 01/29/17 1002  . enoxaparin (LOVENOX) injection 40 mg  40 mg Subcutaneous Q24H Florencia Reasons, MD   40 mg at 01/26/17 2204  . famotidine (PEPCID) tablet 20 mg  20 mg Oral BID Bonnielee Haff, MD   20 mg at 01/29/17 1002  . feeding supplement (ENSURE ENLIVE) (ENSURE ENLIVE) liquid 237 mL  237 mL Oral TID BM Bonnielee Haff, MD   237 mL at 01/29/17 1002  . finasteride (PROSCAR) tablet 5 mg  5 mg Oral Daily Florencia Reasons, MD   5 mg at 01/29/17 1002  . fluticasone (FLONASE) 50 MCG/ACT nasal spray 1 spray  1 spray Each Nare Daily Florencia Reasons, MD   1 spray at 01/29/17 1002  . guaiFENesin (MUCINEX) 12 hr tablet 600 mg  600 mg Oral BID Bonnielee Haff, MD   600 mg at 01/29/17 1002  . loratadine (CLARITIN)  tablet 10 mg  10 mg Oral Daily Florencia Reasons, MD   10 mg at 01/29/17 1002  . montelukast (SINGULAIR) tablet 10 mg  10 mg Oral QHS Florencia Reasons, MD   10 mg at 01/28/17 2143  . pantoprazole (PROTONIX) EC tablet 40 mg  40 mg Oral BID Bonnielee Haff, MD   40 mg at 01/29/17 1002  . polyethylene glycol (MIRALAX / GLYCOLAX) packet 17 g  17 g Oral BID Florencia Reasons, MD   17 g at 01/28/17 2145  . saccharomyces boulardii (FLORASTOR) capsule 250 mg  250 mg Oral BID Bonnielee Haff, MD   250 mg at 01/29/17 1002  . senna-docusate (Senokot-S) tablet 1 tablet  1 tablet Oral BID Florencia Reasons, MD   1 tablet at 01/29/17 1002  . temazepam (RESTORIL) capsule 30 mg  30 mg Oral QHS Florencia Reasons, MD   30 mg at 01/28/17 2143  . venlafaxine XR (EFFEXOR-XR) 24 hr capsule 150 mg  150 mg Oral Q breakfast Bonnielee Haff, MD   150 mg at 01/29/17 8341     Discharge Medications: Current Discharge Medication List        START taking these medications   Details   amoxicillin-clavulanate (AUGMENTIN) 875-125 MG tablet Take 1 tablet every 12 (twelve) hours for 7 days by mouth. Qty: 14 tablet, Refills: 0    guaiFENesin (MUCINEX) 600 MG 12 hr tablet Take 1 tablet (600 mg total) 2 (two) times daily by mouth. Qty: 30 tablet, Refills: 0    saccharomyces boulardii (FLORASTOR) 250 MG capsule Take 1 capsule (250 mg total) 2 (two) times daily by mouth. Qty: 30 capsule, Refills: 0    venlafaxine XR (EFFEXOR-XR) 150 MG 24 hr capsule Take 1 capsule (150 mg total) daily with breakfast by mouth. Qty: 30 capsule, Refills: 0          CONTINUE these medications which have CHANGED   Details  omeprazole (PRILOSEC) 20 MG capsule Take 1 capsule (20 mg total) 2 (two) times daily before a meal by mouth. Qty: 60 capsule, Refills: 0    ranitidine (ZANTAC) 150 MG tablet Take 1 tablet (150 mg total) 2 (two) times daily by mouth. Qty: 60 tablet, Refills: 0          CONTINUE these medications which have NOT CHANGED   Details  aspirin 81 MG tablet Take 81 mg by mouth daily.    finasteride (PROSCAR) 5 MG tablet Take 5 mg by mouth daily.    fluticasone (FLONASE) 50 MCG/ACT nasal spray Place into both nostrils daily.    lactose free nutrition (BOOST) LIQD Take 237 mLs by mouth 2 (two) times daily between meals.    loratadine (CLARITIN) 10 MG tablet Take 10 mg by mouth daily.    montelukast (SINGULAIR) 10 MG tablet Take 10 mg by mouth at bedtime.    polyethylene glycol (MIRALAX / GLYCOLAX) packet Take 17 g by mouth 2 (two) times daily.    senna (SENOKOT) 8.6 MG TABS tablet Take 1 tablet by mouth at bedtime.    temazepam (RESTORIL) 15 MG capsule Take 30 mg by mouth at bedtime.    VITAMIN B1-B12 IM Inject 1,000 mcg into the muscle every 30 (thirty) days.          STOP taking these medications     Venlafaxine HCl (EFFEXOR PO)      acyclovir (ZOVIRAX) 400 MG tablet      ondansetron (ZOFRAN) 8 MG tablet       Relevant Imaging  Results:  Relevant Lab Results:   Additional Information    Servando Snare, LCSW

## 2017-01-29 NOTE — Discharge Instructions (Signed)
Hyponatremia Hyponatremia is when the amount of salt (sodium) in your blood is too low. When salt levels are low, your cells absorb extra water and they swell. The swelling happens throughout the body, but it mostly affects the brain. Follow these instructions at home:  Take medicines only as told by your doctor. Many medicines can make this condition worse. Talk with your doctor about any medicines that you are currently taking.  Carefully follow a recommended diet as told by your doctor.  Carefully follow instructions from your doctor about fluid restrictions.  Keep all follow-up visits as told by your doctor. This is important.  Do not drink alcohol. Contact a doctor if:  You feel sicker to your stomach (nauseous).  You feel more confused.  You feel more tired (fatigued).  Your headache gets worse.  You feel weaker.  Your symptoms go away and then they come back.  You have trouble following the diet instructions. Get help right away if:  You start to twitch and shake (have a seizure).  You pass out (faint).  You keep having watery poop (diarrhea).  You keep throwing up (vomiting). This information is not intended to replace advice given to you by your health care provider. Make sure you discuss any questions you have with your health care provider. Document Released: 11/23/2010 Document Revised: 08/19/2015 Document Reviewed: 03/09/2014 Elsevier Interactive Patient Education  2018 [employer].    Heartburn Heartburn is a type of pain or discomfort that can happen in the throat or chest. It is often described as a burning pain. It may also cause a bad taste in the mouth. Heartburn may feel worse when you lie down or bend over, and it is often worse at night. Heartburn may be caused by stomach contents that move back up into the esophagus (reflux). Follow these instructions at home: Take these actions to decrease your discomfort and to help avoid  complications. Diet  Follow a diet as recommended by your health care provider. This may involve avoiding foods and drinks such as: ? Coffee and tea (with or without caffeine). ? Drinks that contain alcohol. ? Energy drinks and sports drinks. ? Carbonated drinks or sodas. ? Chocolate and cocoa. ? Peppermint and mint flavorings. ? Garlic and onions. ? Horseradish. ? Spicy and acidic foods, including peppers, chili powder, curry powder, vinegar, hot sauces, and barbecue sauce. ? Citrus fruit juices and citrus fruits, such as oranges, lemons, and limes. ? Tomato-based foods, such as red sauce, chili, salsa, and pizza with red sauce. ? Fried and fatty foods, such as donuts, french fries, potato chips, and high-fat dressings. ? High-fat meats, such as hot dogs and fatty cuts of red and white meats, such as rib eye steak, sausage, ham, and bacon. ? High-fat dairy items, such as whole milk, butter, and cream cheese.  Eat small, frequent meals instead of large meals.  Avoid drinking large amounts of liquid with your meals.  Avoid eating meals during the 2-3 hours before bedtime.  Avoid lying down right after you eat.  Do not exercise right after you eat. General instructions  Pay attention to any changes in your symptoms.  Take over-the-counter and prescription medicines only as told by your health care provider. Do not take aspirin, ibuprofen, or other NSAIDs unless your health care provider told you to do so.  Do not use any tobacco products, including cigarettes, chewing tobacco, and e-cigarettes. If you need help quitting, ask your health care provider.  Wear loose-fitting clothing.  Do not wear anything tight around your waist that causes pressure on your abdomen.  Raise (elevate) the head of your bed about 6 inches (15 cm).  Try to reduce your stress, such as with yoga or meditation. If you need help reducing stress, ask your health care provider.  If you are overweight, reduce  your weight to an amount that is healthy for you. Ask your health care provider for guidance about a safe weight loss goal.  Keep all follow-up visits as told by your health care provider. This is important. Contact a health care provider if:  You have new symptoms.  You have unexplained weight loss.  You have difficulty swallowing, or it hurts to swallow.  You have wheezing or a persistent cough.  Your symptoms do not improve with treatment.  You have frequent heartburn for more than two weeks. Get help right away if:  You have pain in your arms, neck, jaw, teeth, or back.  You feel sweaty, dizzy, or light-headed.  You have chest pain or shortness of breath.  You vomit and your vomit looks like blood or coffee grounds.  Your stool is bloody or black. This information is not intended to replace advice given to you by your health care provider. Make sure you discuss any questions you have with your health care provider. Document Released: 07/30/2008 Document Revised: 08/19/2015 Document Reviewed: 07/08/2014 Elsevier Interactive Patient Education  2017 [employer].

## 2017-01-29 NOTE — Progress Notes (Signed)
Date: January 29, 2017 Discharge orders review for case management needs.  None found  Per patient or family member no additional needs at home. Velva Harman, BSN, [place], CCM:  [phone]

## 2017-01-29 NOTE — Progress Notes (Signed)
Christopher Fowler.   DOB:15-Jun-1924   VO#:[id number]    Subjective: He feels well.  There is no report of fever or chills.  Leukocytosis had improved.  No recent cough  Objective:  Vitals:   01/28/17 2146 01/29/17 0644  BP: (!) 173/83 (!) 152/81  Pulse: (!) 101 90  Resp: 18 18  Temp: 98.4 F (36.9 C) 98.1 F (36.7 C)  SpO2: 100% 96%     Intake/Output Summary (Last 24 hours) at 01/29/2017 1027 Last data filed at 01/29/2017 0758 Gross per 24 hour  Intake 360 ml  Output 4425 ml  Net -4065 ml    GENERAL:alert, no distress and comfortable SKIN: skin color, texture, turgor are normal, no rashes or significant lesions EYES: normal, Conjunctiva are pink and non-injected, sclera clear Musculoskeletal:no cyanosis of digits and no clubbing  NEURO: alert & oriented x 3 with fluent speech, no focal motor/sensory deficits   Labs:  Lab Results  Component Value Date   WBC 10.4 01/28/2017   HGB 11.2 (L) 01/28/2017   HCT 33.2 (L) 01/28/2017   MCV 85.6 01/28/2017   PLT 480 (H) 01/28/2017   NEUTROABS 9.6 (H) 01/27/2017    Lab Results  Component Value Date   NA 131 (L) 01/29/2017   K 3.5 01/29/2017   CL 95 (L) 01/29/2017   CO2 29 01/29/2017    Studies: I have reviewed his last CT imaging   Assessment & Plan:   History of CLL His PET CT scan from June 2018 showed complete response CT scan show no evidence of cancer.  Leukocytosis had resolved.  Anemia chronic disease He is not symptomatic.  Observe  Thrombocytosis Likely reactive to infection Observe only  Aspiration pneumonia We discussed precaution against aspiration  Hyponatremia Likely secondary to pulmonary process Observe only  Moderate protein calorie malnutrition He stated he has appetite to eat Consider Remeron if not improved  Constipation He is on MiraLAX and Senokot Continue the same  Profound weakness He would benefit from PT evaluation  Discharge planning He is ready to be discharged. I  will send scheduling message to see him back in my office in 3 months.  I addressed all his questions and concerns  [name], MD 01/29/2017  10:27 AM

## 2017-01-29 NOTE — Progress Notes (Signed)
Went over d/c instructions with patient and daughter.  Called report to nurse, Malachy Mood, at [facility].  Patient left hospital via w/c and with daughter. Daughter transported pt back to facility.  Patient took all hard scripts and belongings with him at d/c.

## 2017-01-29 NOTE — Discharge Summary (Signed)
[facility]  Physician Discharge Summary   Patient ID: Christopher Fowler. MRN: [id number] DOB/AGE: May 12, 1924 83 y.o.  Admit date: 01/25/2017 Discharge date: 01/29/2017  PCP: Christopher Manes, MD  DISCHARGE DIAGNOSES:  Principal Problem:   MDD (major depressive disorder), single episode, moderate ([place]) Active Problems:   CLL (chronic lymphocytic leukemia) ([place])   Anemia due to chronic illness   Other constipation   Hyponatremia   Recurrent pneumonia   RECOMMENDATIONS FOR OUTPATIENT FOLLOW UP: 1. Patient's head of bed needs to be as elevated as possible to avoid significant reflux 2. Basic metabolic panel to be checked in 1 week time to ensure sodium level is stable. 3. Fluid restriction of 1500 mL/day  DISCHARGE CONDITION: fair  Diet recommendation: Regular as tolerated  Filed Weights   01/25/17 1723  Weight: 68 kg (150 lb 0 oz)    INITIAL HISTORY: A 83 year old Caucasian male with past medical history of neurogenic bladder who self catheterizes, history of CLL in remission, history of depression who was sent over from gastroenterology office for dehydration weight loss hyponatremia.  Patient was hospitalized for further management.  There was also some concern for worsening depression.  Consultations:  Oncology  Psychiatry   HOSPITAL COURSE:   Recurrent pneumonia/aspiration pneumonia Patient had abnormal chest x-ray findings in October.  Patient does have a cough.  X-ray again showed area of concern in the right lower lobe.  Patient underwent CT scan of the chest which raised concern for dense consolidation in the right lower lung most likely due to aspiration.  Patient does have symptoms of reflux disease which are quite severe .  It is quite possible that GERD is the reason for his aspiration.  He denies any difficulty swallowing otherwise.    Seen by speech therapy.  Patient was started on Unasyn.  He has improved.  Transitioned over to Augmentin.  WBC is  now normal.  Dose of his acid suppressive medications increased.  He was asked to keep his head of bed elevated as much as possible.    Leukocytosis Thought to be mainly due to infection rather than hematological or oncological process.  WBC is now normal.  History of CLL in remission Followed by oncology.  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    Patient was placed on fluid restriction.  Sodium level improved to 131.  This will need to follow-up in the next week or so.    Normocytic anemia/thrombocytosis No overt bleeding.  Hemoglobin is stable.   History of depression Apparently his wife passed away this past summer.  He has been depressed ever since.  Patient mentioned suicidal ideation at the time of admission though not currently.  He is followed by psychiatry as an outpatient ([name]). Inpatient psychiatry is consulted and they have seen the patient.  TSH is normal.  Dose of Effexor has been increased by psychiatry. Discussions were also held with his outpatient psychiatrist.  He will need to follow-up with psychiatry in 2 weeks time.  Patient was also seen by physical therapy and was able to ambulate about 400 feet using a rolling walker.    Mildly elevated AST Significance is unclear.  Outpatient monitoring.  Severe protein calorie malnutrition Encourage oral intake.  Constipation Continue laxatives.  History of neurogenic bladder Patient is followed by Dr. Dutch Gray with urology.  He self catheterizes at home and has been doing so for many years.    He did have a Foley catheter during  this hospitalization which will be removed prior to discharge.   Overall stable.  Okay for discharge back to his assisted living facility today.    PERTINENT LABS:  The results of significant diagnostics from this hospitalization (including imaging, microbiology, ancillary and laboratory) are listed below for reference.     Microbiology: Recent Results (from the past 240 hour(s))  MRSA PCR Screening     Status: None   Collection Time: 01/25/17  5:47 PM  Result Value Ref Range Status   MRSA by PCR NEGATIVE NEGATIVE Final    Comment:        The GeneXpert MRSA Assay (FDA approved for NASAL specimens only), is one component of a comprehensive MRSA colonization surveillance program. It is not intended to diagnose MRSA infection nor to guide or monitor treatment for MRSA infections.      Labs: Basic Metabolic Panel: Recent Labs  Lab 01/25/17 1349 01/25/17 1407 01/26/17 0556 01/27/17 0713 01/28/17 0714 01/29/17 0603  NA 125* 125* 128* 129* 127* 131*  K 3.6 3.4* 3.6 3.4* 3.8 3.5  CL 88* 85* 92* 92* 90* 95*  CO2 29  --  26 28 28 29   GLUCOSE 109* 104* 102* 101* 96 98  BUN 19 17 17 17 14 18   CREATININE 0.78 0.80 0.74 0.72 0.68 0.69  CALCIUM 7.9*  --  7.8* 7.7* 8.1* 8.2*  MG  --   --  1.7  --   --   --    Liver Function Tests: Recent Labs  Lab 01/24/17 1048 01/25/17 1349 01/26/17 0556 01/27/17 0713  AST 47* 46* 45* 60*  ALT 41 38 37 48  ALKPHOS 86 85 83 82  BILITOT 1.1 1.0 0.9 0.8  PROT 6.4 5.7* 5.4* 5.2*  ALBUMIN 3.3* 2.6* 2.5* 2.3*   CBC: Recent Labs  Lab 01/24/17 1048 01/25/17 1349 01/25/17 1407 01/26/17 0556 01/27/17 0713 01/28/17 0714  WBC 21.0 Repeated and verified X2.* 17.4*  --  15.6* 12.2* 10.4  NEUTROABS 18.4* 14.5*  --  13.2* 9.6*  --   HGB 11.8* 10.2* 9.9* 10.1* 10.3* 11.2*  HCT 35.1* 30.0* 29.0* 29.5* 30.0* 33.2*  MCV 87.2 85.0  --  85.0 85.0 85.6  PLT 522.0* 408*  --  436* 453* 480*    IMAGING STUDIES Dg Chest 2 View  Result Date: 01/25/2017 CLINICAL DATA:  Cough. EXAM: CHEST  2 VIEW COMPARISON:  Radiographs of January 05, 2017. PET scan of August 25, 2016. FINDINGS: The heart size and mediastinal contours are within normal limits. No pneumothorax or pleural effusion is noted. Left lung is clear. Increased right lower lobe opacity is noted. The visualized  skeletal structures are unremarkable. IMPRESSION: Right lower lobe opacity noted on prior exam has increased in size. While this may simply represent worsening pneumonia, CT scan of the chest is recommended to evaluate for possible underlying neoplasm or malignancy. Electronically Signed   By: Marijo Conception, M.D.   On: 01/25/2017 16:44   Dg Chest 2 View  Result Date: 01/05/2017 CLINICAL DATA:  Bronchitis. Productive cough with bloody sputum for 1 week. History of leukemia. EXAM: CHEST  2 VIEW COMPARISON:  PET-CT 08/25/2016.  Chest radiographs 08/28/2006. FINDINGS: The cardiomediastinal silhouette is within normal limits. The lungs are hyperinflated with new confluent airspace opacity posteriorly in the right lower lobe. There is mild biapical pleural thickening. No sizable pleural effusion or pneumothorax is identified. No acute osseous abnormality is seen. IMPRESSION: New right lower lobe airspace opacity compatible with pneumonia. Followup PA  and lateral chest X-ray is recommended in 3-4 weeks following trial of antibiotic therapy to ensure resolution and exclude underlying malignancy. Electronically Signed   By: Logan Bores M.D.   On: 01/05/2017 10:32   Ct Chest W Contrast  Result Date: 01/26/2017 CLINICAL DATA:  Cough. Pneumonia. Unresolved. Neurogenic bladder. History of pancreatic cyst. Chronic lymphocytic leukemia. EXAM: CT CHEST WITH CONTRAST TECHNIQUE: Multidetector CT imaging of the chest was performed during intravenous contrast administration. CONTRAST:  63mL ISOVUE-300 IOPAMIDOL (ISOVUE-300) INJECTION 61% COMPARISON:  01/25/2017 chest radiograph.  PET of 08/25/2016. FINDINGS: Cardiovascular: Upper normal ascending aortic size, 3.9 cm. Advanced aortic and branch vessel atherosclerosis. Normal heart size, without pericardial effusion. Multivessel coronary artery atherosclerosis. No central pulmonary embolism, on this non-dedicated study. Mediastinum/Nodes: No supraclavicular adenopathy. No  mediastinal adenopathy. Borderline right hilar adenopathy at 1.4 cm is favored to be reactive. Lungs/Pleura: Increase in left and development of small right pleural effusions since the prior PET. Patent airways, including to the right lower lobe to the segmental level. There is debris in the right mainstem bronchus dependently, including on image 67/ series 5. This is new since the prior PET. Right lower lobe consolidation, with relatively few air bronchograms. Foci of increased density within, including on image 117/series 2. Areas of subtle hypoattenuation within, including superiorly laterally on image 98/ series 2 and posterior medially on image 109/series 2. No underlying nodule or mass in this area on the prior PET. Mild dependent left lower lobe subsegmental atelectasis. A 3 mm left lower lobe pulmonary nodule on image 80/ series 5 is similar to on the prior PET. Upper Abdomen: Scattered too small to characterize liver lesions. Normal imaged portions of the spleen, stomach, adrenal glands, kidneys. Pancreatic tail cystic lesion measures 2.0 cm on image 165/series 2 and demonstrates calcification in its dependent portion. This was relatively similar in size at 1.9 cm back in 2008, most consistent with a benign etiology. Abdominal aortic atherosclerosis. Colonic stool burden suggests constipation. Musculoskeletal: Moderate thoracic spondylosis. IMPRESSION: 1. Dense right lower lobe consolidation. Given combination of extensive hyperattenuation within (possibly contrast) and debris in the right mainstem bronchus, aspiration is favored. Infection could look similar. Areas of relative hypoattenuation within are suspicious for complicating necrosis. No evidence of underlying central obstructive lesion and no lesion in this area on the PET of 08/25/2016. 2. Right greater than left pleural effusions, small. 3. Coronary artery atherosclerosis. Aortic Atherosclerosis (ICD10-I70.0). 4. Pancreatic tail complex cystic  lesion is relatively similar back to 2008 and can be presumed benign in this 83 year old patient. Electronically Signed   By: Abigail Miyamoto M.D.   On: 01/26/2017 16:22    DISCHARGE EXAMINATION: Vitals:   01/28/17 0537 01/28/17 1414 01/28/17 2146 01/29/17 0644  BP: 126/62 (!) 160/80 (!) 173/83 (!) 152/81  Pulse: 82 95 (!) 101 90  Resp: 18 18 18 18   Temp: 98.3 F (36.8 C) 98.3 F (36.8 C) 98.4 F (36.9 C) 98.1 F (36.7 C)  TempSrc: Oral Oral Oral Oral  SpO2: 96% 99% 100% 96%  Weight:      Height:       General appearance: alert, cooperative, appears stated age and no distress Resp: Improved air entry bilaterally especially in the right lower lung.  Continues to have a few crackles. Cardio: regular rate and rhythm, S1, S2 normal, no murmur, click, rub or gallop GI: soft, non-tender; bowel sounds normal; no masses,  no organomegaly Extremities: extremities normal, atraumatic, no cyanosis or edema Neurologic: Grossly normal  DISPOSITION:  assisted  living facility  Discharge Instructions    Call MD for:  extreme fatigue   Complete by:  As directed    Call MD for:  persistant dizziness or light-headedness   Complete by:  As directed    Call MD for:  persistant nausea and vomiting   Complete by:  As directed    Call MD for:  severe uncontrolled pain   Complete by:  As directed    Call MD for:  temperature >100.4   Complete by:  As directed    Diet general   Complete by:  As directed    Discharge instructions   Complete by:  As directed    Fluid restriction to 1500 mL/day.  Follow-up with your primary care provider in 1 week.  Your sodium levels will need to be periodically checked.  Also be sure to follow-up with your psychiatrist in 2 weeks.  Keep head of bed as elevated as possible.  You were cared for by a hospitalist during your hospital stay. If you have any questions about your discharge medications or the care you received while you were in the hospital after you are  discharged, you can call the unit and asked to speak with the hospitalist on call if the hospitalist that took care of you is not available. Once you are discharged, your primary care physician will handle any further medical issues. Please note that NO REFILLS for any discharge medications will be authorized once you are discharged, as it is imperative that you return to your primary care physician (or establish a relationship with a primary care physician if you do not have one) for your aftercare needs so that they can reassess your need for medications and monitor your lab values. If you do not have a primary care physician, you can call [phone] for a physician referral.   Increase activity slowly   Complete by:  As directed       ALLERGIES:  Allergies  Allergen Reactions  . Cymbalta [Duloxetine Hcl]   . Remeron [Mirtazapine]   . Sulfa Antibiotics Hives      Current Discharge Medication List    START taking these medications   Details  amoxicillin-clavulanate (AUGMENTIN) 875-125 MG tablet Take 1 tablet every 12 (twelve) hours for 7 days by mouth. Qty: 14 tablet, Refills: 0    guaiFENesin (MUCINEX) 600 MG 12 hr tablet Take 1 tablet (600 mg total) 2 (two) times daily by mouth. Qty: 30 tablet, Refills: 0    saccharomyces boulardii (FLORASTOR) 250 MG capsule Take 1 capsule (250 mg total) 2 (two) times daily by mouth. Qty: 30 capsule, Refills: 0    venlafaxine XR (EFFEXOR-XR) 150 MG 24 hr capsule Take 1 capsule (150 mg total) daily with breakfast by mouth. Qty: 30 capsule, Refills: 0      CONTINUE these medications which have CHANGED   Details  omeprazole (PRILOSEC) 20 MG capsule Take 1 capsule (20 mg total) 2 (two) times daily before a meal by mouth. Qty: 60 capsule, Refills: 0    ranitidine (ZANTAC) 150 MG tablet Take 1 tablet (150 mg total) 2 (two) times daily by mouth. Qty: 60 tablet, Refills: 0      CONTINUE these medications which have NOT CHANGED   Details  aspirin  81 MG tablet Take 81 mg by mouth daily.    finasteride (PROSCAR) 5 MG tablet Take 5 mg by mouth daily.    fluticasone (FLONASE) 50 MCG/ACT nasal spray Place into both nostrils daily.  lactose free nutrition (BOOST) LIQD Take 237 mLs by mouth 2 (two) times daily between meals.    loratadine (CLARITIN) 10 MG tablet Take 10 mg by mouth daily.    montelukast (SINGULAIR) 10 MG tablet Take 10 mg by mouth at bedtime.    polyethylene glycol (MIRALAX / GLYCOLAX) packet Take 17 g by mouth 2 (two) times daily.    senna (SENOKOT) 8.6 MG TABS tablet Take 1 tablet by mouth at bedtime.    temazepam (RESTORIL) 15 MG capsule Take 30 mg by mouth at bedtime.    VITAMIN B1-B12 IM Inject 1,000 mcg into the muscle every 30 (thirty) days.       STOP taking these medications     Venlafaxine HCl (EFFEXOR PO)      acyclovir (ZOVIRAX) 400 MG tablet      ondansetron (ZOFRAN) 8 MG tablet          Follow-up Information    Stoneking, Hal, MD. Schedule an appointment as soon as possible for a visit in 1 week(s).   Specialty:  Internal Medicine Contact information: [address] [place] [address] [phone]        [name], MD. Schedule an appointment as soon as possible for a visit in 2 week(s).   Specialty:  Psychiatry Contact information: [place] [address] [phone]           TOTAL DISCHARGE TIME: 35 minutes  [facility] Pager [phone]  01/29/2017, 10:20 AM

## 2017-01-30 ENCOUNTER — Telehealth: Payer: Self-pay | Admitting: Hematology and Oncology

## 2017-01-30 NOTE — Telephone Encounter (Signed)
Called patient regarding current schedule °

## 2017-01-31 DIAGNOSIS — I10 Essential (primary) hypertension: Secondary | ICD-10-CM | POA: Diagnosis not present

## 2017-01-31 DIAGNOSIS — E871 Hypo-osmolality and hyponatremia: Secondary | ICD-10-CM | POA: Diagnosis not present

## 2017-01-31 DIAGNOSIS — F322 Major depressive disorder, single episode, severe without psychotic features: Secondary | ICD-10-CM | POA: Diagnosis not present

## 2017-01-31 DIAGNOSIS — J189 Pneumonia, unspecified organism: Secondary | ICD-10-CM | POA: Diagnosis not present

## 2017-02-06 DIAGNOSIS — E860 Dehydration: Secondary | ICD-10-CM | POA: Diagnosis not present

## 2017-02-11 DIAGNOSIS — N3000 Acute cystitis without hematuria: Secondary | ICD-10-CM | POA: Diagnosis not present

## 2017-02-11 DIAGNOSIS — R319 Hematuria, unspecified: Secondary | ICD-10-CM | POA: Diagnosis not present

## 2017-02-11 DIAGNOSIS — N390 Urinary tract infection, site not specified: Secondary | ICD-10-CM | POA: Diagnosis not present

## 2017-02-17 ENCOUNTER — Encounter (HOSPITAL_COMMUNITY): Payer: Self-pay | Admitting: Emergency Medicine

## 2017-02-17 ENCOUNTER — Emergency Department (HOSPITAL_COMMUNITY)
Admission: EM | Admit: 2017-02-17 | Discharge: 2017-02-17 | Disposition: A | Discharge status: Home / Self Care | Payer: Medicare Other | Attending: Emergency Medicine | Admitting: Emergency Medicine

## 2017-02-17 DIAGNOSIS — R339 Retention of urine, unspecified: Secondary | ICD-10-CM | POA: Diagnosis not present

## 2017-02-17 DIAGNOSIS — Z79899 Other long term (current) drug therapy: Secondary | ICD-10-CM | POA: Insufficient documentation

## 2017-02-17 DIAGNOSIS — T83091A Other mechanical complication of indwelling urethral catheter, initial encounter: Secondary | ICD-10-CM | POA: Diagnosis not present

## 2017-02-17 DIAGNOSIS — Z7982 Long term (current) use of aspirin: Secondary | ICD-10-CM | POA: Insufficient documentation

## 2017-02-17 DIAGNOSIS — S3994XA Unspecified injury of external genitals, initial encounter: Secondary | ICD-10-CM | POA: Diagnosis not present

## 2017-02-17 DIAGNOSIS — G8911 Acute pain due to trauma: Secondary | ICD-10-CM | POA: Diagnosis not present

## 2017-02-17 MED ORDER — LIDOCAINE HCL 2 % EX GEL
CUTANEOUS | Status: AC
  Filled 2017-02-17: qty 11

## 2017-02-17 MED ORDER — LIDOCAINE HCL 1 % IJ SOLN
INTRAMUSCULAR | Status: AC
  Filled 2017-02-17: qty 20

## 2017-02-17 MED ORDER — LIDOCAINE HCL 2 % EX GEL
1.0000 "application " | Freq: Once | CUTANEOUS | Status: AC
  Administered 2017-02-17: 1 via URETHRAL

## 2017-02-17 NOTE — ED Provider Notes (Signed)
[facility] DEPT Provider Note   CSN: [id number] Arrival date & time: 02/17/17  2038     History   Chief Complaint Chief Complaint  Patient presents with  . Urinary Retention    HPI Christopher Fowler. is a 83 y.o. male.  83 yo M with a chief complaint of urinary retention.  The patient self caths and has been doing this for the past 10 years.  He told me it is because his bladder is worn out.  He tried multiple times today and was unable to pass it.  Denies any abdominal pain denies fevers denies flank pain.  Denies hematuria   The history is provided by the patient.  Illness  This is a new problem. The current episode started 6 to 12 hours ago. The problem occurs constantly. The problem has not changed since onset.Pertinent negatives include no chest pain, no abdominal pain, no headaches and no shortness of breath. Nothing aggravates the symptoms. Nothing relieves the symptoms. He has tried nothing for the symptoms. The treatment provided no relief.    Past Medical History:  Diagnosis Date  . BPH (benign prostatic hyperplasia)   . CLL (chronic lymphocytic leukemia) ([place])   . Degenerative joint disease (DJD) of hip    hip  . DJD (degenerative joint disease) of cervical spine   . DJD (degenerative joint disease) of knee    left  . Elevated MCV   . Gilbert's syndrome   . Hernia, inguinal    left  . Neurogenic bladder   . Pancreatic cyst    2cm negative needle aspiration  . Spermatocele    right  . 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 Recurrent pneumonia 01/26/2017  . MDD (major depressive disorder), single episode, moderate ([place]) 01/26/2017  . Hyponatremia 01/25/2017  . Other constipation 07/31/2016  . Weight loss 07/03/2016  . Hypersensitivity reaction 06/05/2016  . Goals of care, counseling/discussion 05/24/2016  . Lymphoma, small lymphocytic ([place]) 05/04/2016  .  Pancytopenia, acquired ([place]) 05/04/2016  . Gilbert's syndrome 10/29/2015  . Anemia due to chronic illness 10/29/2015  . CLL (chronic lymphocytic leukemia) ([place]) 10/28/2015    Past Surgical History:  Procedure Laterality Date  . BACK SURGERY     L4 herniated disk  . COLONOSCOPY    . EUS  2007       Home Medications    Prior to Admission medications   Medication Sig Start Date End Date Taking? Authorizing Provider  aspirin 81 MG tablet Take 81 mg by mouth daily.    [provider]  finasteride (PROSCAR) 5 MG tablet Take 5 mg by mouth daily.    [provider]  fluticasone (FLONASE) 50 MCG/ACT nasal spray Place into both nostrils daily.    [provider]  guaiFENesin (MUCINEX) 600 MG 12 hr tablet Take 1 tablet (600 mg total) 2 (two) times daily by mouth. 01/29/17   Bonnielee Haff, MD  lactose free nutrition (BOOST) LIQD Take 237 mLs by mouth 2 (two) times daily between meals.    [provider]  loratadine (CLARITIN) 10 MG tablet Take 10 mg by mouth daily.    [provider]  montelukast (SINGULAIR) 10 MG tablet Take 10 mg by mouth at bedtime.    [provider]  omeprazole (PRILOSEC) 20 MG capsule Take 1 capsule (20 mg total) 2 (two) times daily before a meal by mouth. 01/29/17   [place] Pink,  Gokul, MD  polyethylene glycol (MIRALAX / GLYCOLAX) packet Take 17 g by mouth 2 (two) times daily.    [provider]  ranitidine (ZANTAC) 150 MG tablet Take 1 tablet (150 mg total) 2 (two) times daily by mouth. 01/29/17   Bonnielee Haff, MD  saccharomyces boulardii (FLORASTOR) 250 MG capsule Take 1 capsule (250 mg total) 2 (two) times daily by mouth. 01/29/17   Bonnielee Haff, MD  senna (SENOKOT) 8.6 MG TABS tablet Take 1 tablet by mouth at bedtime.    [provider]  temazepam (RESTORIL) 15 MG capsule Take 30 mg by mouth at bedtime.    [provider]  venlafaxine XR (EFFEXOR-XR) 150 MG 24 hr capsule Take 1 capsule  (150 mg total) daily with breakfast by mouth. 01/30/17   Bonnielee Haff, MD  VITAMIN B1-B12 IM Inject 1,000 mcg into the muscle every 30 (thirty) days.     [provider]    Family History Family History  Problem Relation Age of Onset  . Breast cancer Mother 59  . Cancer Paternal Uncle 30       unknown  . Stomach cancer Neg Hx   . Colon cancer Neg Hx   . Esophageal cancer Neg Hx   . Rectal cancer Neg Hx     Social History Social History   Tobacco Use  . Smoking status: Never Smoker  . Smokeless tobacco: Never Used  Substance Use Topics  . Alcohol use: Yes    Alcohol/week: 1.8 oz    Types: 3 Glasses of wine per week  . Drug use: No     Allergies   Cymbalta [duloxetine hcl]; Remeron [mirtazapine]; and Sulfa antibiotics   Review of Systems Review of Systems  Constitutional: Negative for chills and fever.  HENT: Negative for congestion and facial swelling.   Eyes: Negative for discharge and visual disturbance.  Respiratory: Negative for shortness of breath.   Cardiovascular: Negative for chest pain and palpitations.  Gastrointestinal: Negative for abdominal pain, diarrhea and vomiting.  Genitourinary: Positive for decreased urine volume and difficulty urinating.  Musculoskeletal: Negative for arthralgias and myalgias.  Skin: Negative for color change and rash.  Neurological: Negative for tremors, syncope and headaches.  Psychiatric/Behavioral: Negative for confusion and dysphoric mood.     Physical Exam Updated Vital Signs BP (!) 158/86 (BP Location: Right Arm)   Pulse (!) 104   Temp 98.2 F (36.8 C) (Oral)   Resp 18   Ht 6' (1.829 m)   Wt 63 kg (139 lb)   SpO2 99%   BMI 18.85 kg/m   Physical Exam  Constitutional: He is oriented to person, place, and time. He appears well-developed and well-nourished.  HENT:  Head: Normocephalic and atraumatic.  Eyes: EOM are normal. Pupils are equal, round, and reactive to light.  Neck: Normal range of  motion. Neck supple. No JVD present.  Cardiovascular: Normal rate and regular rhythm. Exam reveals no gallop and no friction rub.  No murmur heard. Pulmonary/Chest: No respiratory distress. He has no wheezes.  Abdominal: He exhibits no distension and no mass. There is no tenderness. There is no rebound and no guarding.  Musculoskeletal: Normal range of motion.  Neurological: He is alert and oriented to person, place, and time.  Skin: No rash noted. No pallor.  Psychiatric: He has a normal mood and affect. His behavior is normal.  Nursing note and vitals reviewed.    ED Treatments / Results  Labs (all labs ordered are listed, but only abnormal  results are displayed) Labs Reviewed  URINE CULTURE    EKG  EKG Interpretation None       Radiology No results found.  Procedures BLADDER CATHETERIZATION Date/Time: 02/17/2017 10:16 PM Performed by: Deno Etienne, DO Authorized by: Deno Etienne, DO   Consent:    Consent obtained:  Verbal   Consent given by:  Patient   Risks discussed:  False passage, infection, pain and incomplete procedure   Alternatives discussed:  No treatment Pre-procedure details:    Procedure purpose:  Therapeutic   Preparation: Patient was prepped and draped in usual sterile fashion   Anesthesia (see MAR for exact dosages):    Anesthesia method:  Topical application Procedure details:    Provider performed due to:  Nurse unable to complete and complicated insertion   Catheter insertion:  Indwelling   Catheter type:  Coude   Catheter size:  16 Fr   Bladder irrigation: no     Number of attempts:  1   Urine characteristics:  Clear Post-procedure details:    Patient tolerance of procedure:  Tolerated well, no immediate complications   (including critical care time) Emergency Focused Ultrasound Exam Limited ultrasound of the Bladder.   Performed and interpreted by Dr. Tyrone Nine Indication: evaluation for urinary retention Transverse and Sagittal views of  the bladder are obtained and calculations are performed to determine an estimated bladder volume for signs of post-renal obstruction.  Findings: Bladder is not full Interpretation: no evidence of outlet obstruction Foley in place Images archived electronically.  CPT Codes: [phone] and [phone]  Medications Ordered in ED Medications  lidocaine (XYLOCAINE) 1 % (with pres) injection (not administered)  lidocaine (XYLOCAINE) 2 % jelly (not administered)  lidocaine (XYLOCAINE) 2 % jelly 1 application (1 application Urethral Given 02/17/17 2207)     Initial Impression / Assessment and Plan / ED Course  I have reviewed the triage vital signs and the nursing notes.  Pertinent labs & imaging results that were available during my care of the patient were reviewed by me and considered in my medical decision making (see chart for details).     83 yo M with a chief complaint of inability to pass his Foley catheter at home.  The tech and the nurse tried without being able to pass the catheter.  Placed by myself without difficulty.  Verified with ultrasound.  Discharge home.  10:20 PM:  I have discussed the diagnosis/risks/treatment options with the patient and believe the pt to be eligible for discharge home to follow-up with Urology. We also discussed returning to the ED immediately if new or worsening sx occur. We discussed the sx which are most concerning (e.g., sudden worsening pain, fever, inability to tolerate by mouth) that necessitate immediate return. Medications administered to the patient during their visit and any new prescriptions provided to the patient are listed below.  Medications given during this visit Medications  lidocaine (XYLOCAINE) 1 % (with pres) injection (not administered)  lidocaine (XYLOCAINE) 2 % jelly (not administered)  lidocaine (XYLOCAINE) 2 % jelly 1 application (1 application Urethral Given 02/17/17 2207)     The patient appears reasonably screen and/or stabilized  for discharge and I doubt any other medical condition or other [facility] requiring further screening, evaluation, or treatment in the ED at this time prior to discharge.    Final Clinical Impressions(s) / ED Diagnoses   Final diagnoses:  Urinary retention    ED Discharge Orders    None       Deno Etienne, DO  02/17/17 2220  

## 2017-02-17 NOTE — ED Triage Notes (Signed)
Brought in by EMS from Assisted living pt complain of urinary retention.Patient stated he normally in and out himself 4-5 x everyday. Last time he did it was at noon time but tonight at 6 pm he said cant put it in. Pt denies pain. V/s Stable.

## 2017-02-17 NOTE — ED Notes (Signed)
Bed: [facility] Expected date:  Expected time:  Means of arrival:  Comments: EMS 83 yo male from assisted living/does self cath x 10 years-unable to cath-wants foley

## 2017-02-17 NOTE — Discharge Instructions (Signed)
Call your urologist on Monday and let them know what happened.

## 2017-02-19 ENCOUNTER — Encounter (HOSPITAL_COMMUNITY): Payer: Self-pay | Admitting: Emergency Medicine

## 2017-02-19 ENCOUNTER — Emergency Department (HOSPITAL_COMMUNITY)
Admission: EM | Admit: 2017-02-19 | Discharge: 2017-02-19 | Disposition: A | Discharge status: Home / Self Care | Payer: Medicare Other | Attending: Emergency Medicine | Admitting: Emergency Medicine

## 2017-02-19 ENCOUNTER — Emergency Department (HOSPITAL_COMMUNITY): Payer: Medicare Other

## 2017-02-19 DIAGNOSIS — Z7982 Long term (current) use of aspirin: Secondary | ICD-10-CM | POA: Insufficient documentation

## 2017-02-19 DIAGNOSIS — R9389 Abnormal findings on diagnostic imaging of other specified body structures: Secondary | ICD-10-CM | POA: Diagnosis not present

## 2017-02-19 DIAGNOSIS — F411 Generalized anxiety disorder: Secondary | ICD-10-CM | POA: Diagnosis not present

## 2017-02-19 DIAGNOSIS — E860 Dehydration: Secondary | ICD-10-CM | POA: Insufficient documentation

## 2017-02-19 DIAGNOSIS — F322 Major depressive disorder, single episode, severe without psychotic features: Secondary | ICD-10-CM | POA: Diagnosis not present

## 2017-02-19 DIAGNOSIS — E871 Hypo-osmolality and hyponatremia: Secondary | ICD-10-CM | POA: Diagnosis not present

## 2017-02-19 DIAGNOSIS — Z79899 Other long term (current) drug therapy: Secondary | ICD-10-CM | POA: Diagnosis not present

## 2017-02-19 DIAGNOSIS — J189 Pneumonia, unspecified organism: Secondary | ICD-10-CM | POA: Diagnosis not present

## 2017-02-19 DIAGNOSIS — E440 Moderate protein-calorie malnutrition: Secondary | ICD-10-CM | POA: Diagnosis not present

## 2017-02-19 DIAGNOSIS — K219 Gastro-esophageal reflux disease without esophagitis: Secondary | ICD-10-CM | POA: Diagnosis not present

## 2017-02-19 DIAGNOSIS — E538 Deficiency of other specified B group vitamins: Secondary | ICD-10-CM | POA: Diagnosis not present

## 2017-02-19 DIAGNOSIS — I10 Essential (primary) hypertension: Secondary | ICD-10-CM | POA: Diagnosis not present

## 2017-02-19 DIAGNOSIS — R339 Retention of urine, unspecified: Secondary | ICD-10-CM | POA: Diagnosis present

## 2017-02-19 LAB — I-STAT CHEM 8, ED
BUN: 18 mg/dL (ref 6–20)
CREATININE: 0.8 mg/dL (ref 0.61–1.24)
Calcium, Ion: 1.11 mmol/L — ABNORMAL LOW (ref 1.15–1.40)
Chloride: 87 mmol/L — ABNORMAL LOW (ref 101–111)
GLUCOSE: 108 mg/dL — AB (ref 65–99)
HCT: 35 % — ABNORMAL LOW (ref 39.0–52.0)
HEMOGLOBIN: 11.9 g/dL — AB (ref 13.0–17.0)
POTASSIUM: 3.6 mmol/L (ref 3.5–5.1)
Sodium: 126 mmol/L — ABNORMAL LOW (ref 135–145)
TCO2: 28 mmol/L (ref 22–32)

## 2017-02-19 LAB — CBC
HEMATOCRIT: 31.9 % — AB (ref 39.0–52.0)
HEMOGLOBIN: 11 g/dL — AB (ref 13.0–17.0)
MCH: 29.5 pg (ref 26.0–34.0)
MCHC: 34.5 g/dL (ref 30.0–36.0)
MCV: 85.5 fL (ref 78.0–100.0)
Platelets: 371 10*3/uL (ref 150–400)
RBC: 3.73 MIL/uL — ABNORMAL LOW (ref 4.22–5.81)
RDW: 15.1 % (ref 11.5–15.5)
WBC: 13.9 10*3/uL — ABNORMAL HIGH (ref 4.0–10.5)

## 2017-02-19 LAB — URINALYSIS, ROUTINE W REFLEX MICROSCOPIC
Bacteria, UA: NONE SEEN
Bilirubin Urine: NEGATIVE
GLUCOSE, UA: NEGATIVE mg/dL
KETONES UR: NEGATIVE mg/dL
NITRITE: NEGATIVE
PH: 6 (ref 5.0–8.0)
Protein, ur: NEGATIVE mg/dL
Specific Gravity, Urine: 1.014 (ref 1.005–1.030)
Squamous Epithelial / LPF: NONE SEEN

## 2017-02-19 LAB — BASIC METABOLIC PANEL
ANION GAP: 9 (ref 5–15)
BUN: 23 mg/dL — ABNORMAL HIGH (ref 6–20)
CHLORIDE: 87 mmol/L — AB (ref 101–111)
CO2: 28 mmol/L (ref 22–32)
Calcium: 8.4 mg/dL — ABNORMAL LOW (ref 8.9–10.3)
Creatinine, Ser: 0.99 mg/dL (ref 0.61–1.24)
GFR calc non Af Amer: >60 mL/min (ref >60)
Glucose, Bld: 112 mg/dL — ABNORMAL HIGH (ref 65–99)
POTASSIUM: 4.4 mmol/L (ref 3.5–5.1)
SODIUM: 124 mmol/L — AB (ref 135–145)

## 2017-02-19 LAB — URINE CULTURE: CULTURE: NO GROWTH

## 2017-02-19 MED ORDER — SODIUM CHLORIDE 0.9 % IV BOLUS (SEPSIS)
1000.0000 mL | Freq: Once | INTRAVENOUS | Status: AC
  Administered 2017-02-19: 1000 mL via INTRAVENOUS

## 2017-02-19 NOTE — Discharge Instructions (Signed)
Your lab work today showed mild dehydration as well as low sodium.  I would recommend following up with a doctor in the next 3-5 days for repeat sodium check. If you are able to see your Urologist for your foley catheter, he may be able to draw the sodium level for you. Try to see your urologist in 3-5 days. If you cannot find an appointment with the Urologist, call Dr. Felipa Eth and have your labs drawn in the next week.

## 2017-02-19 NOTE — ED Provider Notes (Signed)
[facility] DEPT Provider Note   CSN: [id number] Arrival date & time: 02/19/17  1645     History   Chief Complaint Chief Complaint  Patient presents with  . Urinary Retention    HPI Christopher Fowler. is a 83 y.o. male.      Very pleasant 83 year old male here with possible urinary retention.  The patient has a history of neurogenic bladder with chronic self catheterizations.  He was just seen 2 days ago for urinary retention and difficulty passing his catheter.  A Foley catheter was placed and he initially had a large amount of drainage.  Since then, over the last 24 hours, he does note that he has had decreased output in his bag.  He denies any associated pain.  He does admit he was not drinking very much water initially, because he was concerned about making urine and not being able to pass it.  He denies any fevers or chills.  No abdominal pain or distention.  No flank pain.  Denies any fevers or chills.  Denies any blood.  He does note that he has produced urine in his Foley.  Past Medical History:  Diagnosis Date  . BPH (benign prostatic hyperplasia)   . CLL (chronic lymphocytic leukemia) ([place])   . Degenerative joint disease (DJD) of hip    hip  . DJD (degenerative joint disease) of cervical spine   . DJD (degenerative joint disease) of knee    left  . Elevated MCV   . Gilbert's syndrome   . Hernia, inguinal    left  . Neurogenic bladder   . Pancreatic cyst    2cm negative needle aspiration  . Spermatocele    right  . 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 Recurrent pneumonia 01/26/2017  . MDD (major depressive disorder), single episode, moderate ([place]) 01/26/2017  . Hyponatremia 01/25/2017  . Other constipation 07/31/2016  . Weight loss 07/03/2016  . Hypersensitivity reaction 06/05/2016  . Goals of care, counseling/discussion 05/24/2016  . Lymphoma, small lymphocytic ([place])  05/04/2016  . Pancytopenia, acquired ([place]) 05/04/2016  . Gilbert's syndrome 10/29/2015  . Anemia due to chronic illness 10/29/2015  . CLL (chronic lymphocytic leukemia) ([place]) 10/28/2015    Past Surgical History:  Procedure Laterality Date  . BACK SURGERY     L4 herniated disk  . COLONOSCOPY    . EUS  2007       Home Medications    Prior to Admission medications   Medication Sig Start Date End Date Taking? Authorizing Provider  aspirin 81 MG tablet Take 81 mg by mouth daily.   Yes [provider]  finasteride (PROSCAR) 5 MG tablet Take 5 mg by mouth daily.   Yes [provider]  guaiFENesin (MUCINEX) 600 MG 12 hr tablet Take 1 tablet (600 mg total) 2 (two) times daily by mouth. 01/29/17  Yes Bonnielee Haff, MD  loratadine (CLARITIN) 10 MG tablet Take 10 mg by mouth daily.   Yes [provider]  montelukast (SINGULAIR) 10 MG tablet Take 10 mg by mouth at bedtime.   Yes [provider]  omeprazole (PRILOSEC) 20 MG capsule Take 1 capsule (20 mg total) 2 (two) times daily before a meal by mouth. 01/29/17  Yes Bonnielee Haff, MD  polyethylene glycol [facility] / GLYCOLAX) packet Take 17 g by mouth 2 (two) times daily.   Yes [provider]  ranitidine (ZANTAC) 150 MG  tablet Take 1 tablet (150 mg total) 2 (two) times daily by mouth. 01/29/17  Yes Bonnielee Haff, MD  saccharomyces boulardii (FLORASTOR) 250 MG capsule Take 1 capsule (250 mg total) 2 (two) times daily by mouth. 01/29/17  Yes Bonnielee Haff, MD  senna (SENOKOT) 8.6 MG TABS tablet Take 1 tablet by mouth at bedtime.   Yes [provider]  temazepam (RESTORIL) 15 MG capsule Take 30 mg by mouth at bedtime.   Yes [provider]  venlafaxine XR (EFFEXOR-XR) 150 MG 24 hr capsule Take 1 capsule (150 mg total) daily with breakfast by mouth. 01/30/17  Yes Bonnielee Haff, MD  lactose free nutrition (BOOST) LIQD Take 237 mLs by mouth 2 (two) times daily between meals.     [provider]    Family History Family History  Problem Relation Age of Onset  . Breast cancer Mother 73  . Cancer Paternal Uncle 90       unknown  . Stomach cancer Neg Hx   . Colon cancer Neg Hx   . Esophageal cancer Neg Hx   . Rectal cancer Neg Hx     Social History Social History   Tobacco Use  . Smoking status: Never Smoker  . Smokeless tobacco: Never Used  Substance Use Topics  . Alcohol use: Yes    Alcohol/week: 1.8 oz    Types: 3 Glasses of wine per week  . Drug use: No     Allergies   Cymbalta [duloxetine hcl]; Remeron [mirtazapine]; and Sulfa antibiotics   Review of Systems Review of Systems  Constitutional: Negative for chills and fever.  HENT: Negative for ear pain and sore throat.   Eyes: Negative for pain and visual disturbance.  Respiratory: Negative for cough and shortness of breath.   Cardiovascular: Negative for chest pain and palpitations.  Gastrointestinal: Negative for abdominal pain and vomiting.  Genitourinary: Positive for decreased urine volume. Negative for dysuria and hematuria.  Musculoskeletal: Negative for arthralgias and back pain.  Skin: Negative for color change and rash.  Neurological: Negative for seizures and syncope.  All other systems reviewed and are negative.    Physical Exam Updated Vital Signs BP 136/71 (BP Location: Right Arm)   Pulse 89   Temp 98 F (36.7 C) (Oral)   Resp 16   Ht 6' (1.829 m)   Wt 68 kg (150 lb)   SpO2 98%   BMI 20.34 kg/m   Physical Exam  Constitutional: He is oriented to person, place, and time. He appears well-developed and well-nourished. No distress.  HENT:  Head: Normocephalic and atraumatic.  Dry MM  Eyes: Conjunctivae are normal.  Neck: Neck supple.  Cardiovascular: Normal rate, regular rhythm and normal heart sounds. Exam reveals no friction rub.  No murmur heard. Pulmonary/Chest: Effort normal and breath sounds normal. No respiratory distress. He has no wheezes. He  has no rales.  Abdominal: Soft. Bowel sounds are normal. He exhibits no distension.  No abdominal distention.  No suprapubic tenderness or fullness.  Genitourinary:  Genitourinary Comments: Foley catheter in place draining clear yellow urine.  No bleeding or discharge around the Foley.  Musculoskeletal: He exhibits no edema.  Neurological: He is alert and oriented to person, place, and time. He exhibits normal muscle tone.  Skin: Skin is warm. Capillary refill takes less than 2 seconds.  Psychiatric: He has a normal mood and affect.  Nursing note and vitals reviewed.    ED Treatments / Results  Labs (all labs ordered are listed, but  only abnormal results are displayed) Labs Reviewed  CBC - Abnormal; Notable for the following components:      Result Value   WBC 13.9 (*)    RBC 3.73 (*)    Hemoglobin 11.0 (*)    HCT 31.9 (*)    All other components within normal limits  BASIC METABOLIC PANEL - Abnormal; Notable for the following components:   Sodium 124 (*)    Chloride 87 (*)    Glucose, Bld 112 (*)    BUN 23 (*)    Calcium 8.4 (*)    All other components within normal limits  URINALYSIS, ROUTINE W REFLEX MICROSCOPIC - Abnormal; Notable for the following components:   Hgb urine dipstick SMALL (*)    Leukocytes, UA TRACE (*)    All other components within normal limits  I-STAT CHEM 8, ED - Abnormal; Notable for the following components:   Sodium 126 (*)    Chloride 87 (*)    Glucose, Bld 108 (*)    Calcium, Ion 1.11 (*)    Hemoglobin 11.9 (*)    HCT 35.0 (*)    All other components within normal limits    EKG  EKG Interpretation None       Radiology Dg Chest 2 View  Result Date: 02/19/2017 CLINICAL DATA:  Urinary retention.  Follow-up recent pneumonia. EXAM: CHEST  2 VIEW COMPARISON:  Radiographs and CT 01/25/2017 and 01/26/2017 FINDINGS: Improving right lung base aeration with residual focal opacity dependently. No new focal airspace disease. Possible minimal  residual pleural fluid dependently. No progressive pleural effusion. Normal heart size and mediastinal contours. No pulmonary edema. No pneumothorax. Unchanged osseous structures. IMPRESSION: Improving right lung base aeration with residual consolidation posteriorly. Recommend continued radiographic follow-up in an additional 3-4 weeks to complete resolution. No new abnormality. Electronically Signed   By: Jeb Levering M.D.   On: 02/19/2017 22:10    Procedures Procedures (including critical care time)  Medications Ordered in ED Medications  sodium chloride 0.9 % bolus 1,000 mL (0 mLs Intravenous Stopped 02/19/17 2218)     Initial Impression / Assessment and Plan / ED Course  I have reviewed the triage vital signs and the nursing notes.  Pertinent labs & imaging results that were available during my care of the patient were reviewed by me and considered in my medical decision making (see chart for details).     83 yo M with PMHx as above here with concern for urinary retention s/p recent foley placement. Bladder scan shows <30 cc. I suspect his sx are 2/2 decreased UOP in setting of recent obstruction/post-obstructive diuresis now complicated by dehydration 2/2 por PO intake. His Cr is near baseline but he does have elevated BUN, dry MM, and acute on chronic hyponatremia consistent with hypovolemia. UA is o/w without signs of UTI. He has no flank pain or signs of pyelo or stone. Pt given 1L NS with improvement in repeat Na. He is o/w asymptomatic and well appearing. His CXR shows resolving PNA, for which he has been treated. Will advise him to continue his tx for hyponatremia started during his last admission, with outpt Urology and PCP follow-up. Return precautions given. He is o/w asymptomatic from hyponatremia standpoint with no HA, tremors, vision changes, gait disturbances, or other abnormality.  Final Clinical Impressions(s) / ED Diagnoses   Final diagnoses:  Dehydration    Hyponatremia    ED Discharge Orders    None       Duffy Bruce, MD 02/20/17  0146  

## 2017-02-19 NOTE — ED Triage Notes (Signed)
Patient reports decreased output from catheter since 1200 today. Denies pain. Patient reports catheter was placed x2 days ago. Reports adequate intake. 65ml according to bladder scan.

## 2017-02-21 DIAGNOSIS — R338 Other retention of urine: Secondary | ICD-10-CM | POA: Diagnosis not present

## 2017-02-23 DIAGNOSIS — Z79899 Other long term (current) drug therapy: Secondary | ICD-10-CM | POA: Diagnosis not present

## 2017-02-28 DIAGNOSIS — R338 Other retention of urine: Secondary | ICD-10-CM | POA: Diagnosis not present

## 2017-03-04 ENCOUNTER — Other Ambulatory Visit: Payer: Self-pay | Admitting: Urology

## 2017-03-04 NOTE — Progress Notes (Signed)
Patient called with symptoms typical of a UTI. Urgency, dyruria with cath and increased need for cathing. Uncomfortable and wishes for abx. Unable to drop off ucx at this time given snow. Will call in cipro

## 2017-03-07 ENCOUNTER — Other Ambulatory Visit: Payer: Self-pay | Admitting: Geriatric Medicine

## 2017-03-07 ENCOUNTER — Ambulatory Visit
Admission: RE | Admit: 2017-03-07 | Discharge: 2017-03-07 | Disposition: A | Discharge status: Home / Self Care | Payer: Medicare Other | Source: Ambulatory Visit | Attending: Geriatric Medicine | Admitting: Geriatric Medicine

## 2017-03-07 DIAGNOSIS — F322 Major depressive disorder, single episode, severe without psychotic features: Secondary | ICD-10-CM | POA: Diagnosis not present

## 2017-03-07 DIAGNOSIS — R059 Cough, unspecified: Secondary | ICD-10-CM

## 2017-03-07 DIAGNOSIS — R05 Cough: Secondary | ICD-10-CM

## 2017-03-07 DIAGNOSIS — K5901 Slow transit constipation: Secondary | ICD-10-CM | POA: Diagnosis not present

## 2017-03-07 DIAGNOSIS — Z79899 Other long term (current) drug therapy: Secondary | ICD-10-CM | POA: Diagnosis not present

## 2017-03-07 DIAGNOSIS — J3089 Other allergic rhinitis: Secondary | ICD-10-CM | POA: Diagnosis not present

## 2017-03-07 DIAGNOSIS — E440 Moderate protein-calorie malnutrition: Secondary | ICD-10-CM | POA: Diagnosis not present

## 2017-03-07 DIAGNOSIS — E222 Syndrome of inappropriate secretion of antidiuretic hormone: Secondary | ICD-10-CM | POA: Diagnosis not present

## 2017-03-09 ENCOUNTER — Other Ambulatory Visit: Payer: Self-pay | Admitting: Geriatric Medicine

## 2017-03-09 DIAGNOSIS — R9389 Abnormal findings on diagnostic imaging of other specified body structures: Secondary | ICD-10-CM

## 2017-03-13 ENCOUNTER — Ambulatory Visit
Admission: RE | Admit: 2017-03-13 | Discharge: 2017-03-13 | Disposition: A | Discharge status: Home / Self Care | Payer: Medicare Other | Source: Ambulatory Visit | Attending: Geriatric Medicine | Admitting: Geriatric Medicine

## 2017-03-13 DIAGNOSIS — R9389 Abnormal findings on diagnostic imaging of other specified body structures: Secondary | ICD-10-CM

## 2017-03-13 DIAGNOSIS — J90 Pleural effusion, not elsewhere classified: Secondary | ICD-10-CM | POA: Diagnosis not present

## 2017-03-26 DIAGNOSIS — F411 Generalized anxiety disorder: Secondary | ICD-10-CM | POA: Diagnosis not present

## 2017-03-26 DIAGNOSIS — H6123 Impacted cerumen, bilateral: Secondary | ICD-10-CM | POA: Diagnosis not present

## 2017-03-26 DIAGNOSIS — H9193 Unspecified hearing loss, bilateral: Secondary | ICD-10-CM | POA: Diagnosis not present

## 2017-03-29 DIAGNOSIS — E538 Deficiency of other specified B group vitamins: Secondary | ICD-10-CM | POA: Diagnosis not present

## 2017-04-04 DIAGNOSIS — R278 Other lack of coordination: Secondary | ICD-10-CM | POA: Diagnosis not present

## 2017-04-04 DIAGNOSIS — R2681 Unsteadiness on feet: Secondary | ICD-10-CM | POA: Diagnosis not present

## 2017-04-04 DIAGNOSIS — M6281 Muscle weakness (generalized): Secondary | ICD-10-CM | POA: Diagnosis not present

## 2017-04-05 ENCOUNTER — Encounter: Payer: Self-pay | Admitting: Physician Assistant

## 2017-04-05 ENCOUNTER — Ambulatory Visit (INDEPENDENT_AMBULATORY_CARE_PROVIDER_SITE_OTHER): Payer: Medicare Other | Admitting: Physician Assistant

## 2017-04-05 VITALS — BP 124/70 | HR 106 | Ht 72.0 in | Wt 141.2 lb

## 2017-04-05 DIAGNOSIS — R1110 Vomiting, unspecified: Secondary | ICD-10-CM | POA: Diagnosis not present

## 2017-04-05 DIAGNOSIS — R634 Abnormal weight loss: Secondary | ICD-10-CM | POA: Diagnosis not present

## 2017-04-05 DIAGNOSIS — M6281 Muscle weakness (generalized): Secondary | ICD-10-CM | POA: Diagnosis not present

## 2017-04-05 DIAGNOSIS — R278 Other lack of coordination: Secondary | ICD-10-CM | POA: Diagnosis not present

## 2017-04-05 DIAGNOSIS — R2681 Unsteadiness on feet: Secondary | ICD-10-CM | POA: Diagnosis not present

## 2017-04-05 DIAGNOSIS — K219 Gastro-esophageal reflux disease without esophagitis: Secondary | ICD-10-CM | POA: Diagnosis not present

## 2017-04-05 NOTE — Progress Notes (Addendum)
Subjective:    Patient ID: Christopher Fowler., male    DOB: [birth year], 84 y.o.   MRN: [id number]  HPI Christopher Fowler is a very nice 84 year old white male, known to Dr. Carlean Fowler who comes in today for follow-up. He was last seen in the office on 01/24/2017. At that time he was complaining of regurgitation type symptoms, reflux anorexia and weight loss. He had lost his wife this past summer after a almost 34 year marriage. It was felt that depression was likely contributing to some of his anorexia and weight loss. He had also moved into an assisted living facility. He has now relocated to [facility] and says he likes it there and food is very good. He has currently been on omeprazole 20 mg by mouth twice daily and Zantac 150 mg by mouth twice daily. He is been drinking and sure once daily. He underwent EGD with Dr. Carlean Fowler on 01/25/2017 which was a normal exam. Around that same time he wound up being diagnosed with pneumonia and treated per per Dr. Felipa Fowler. He  says that has cleared and he is feeling much better. Says he is no longer having any cough or sputum production. He has no current complaints of heartburn or indigestion. He has no complaints of dysphagia or odynophagia. He also has not had any recent regurgitation type symptoms and no sour brash that he is aware of. He is very interested in having a diet to follow and any anti-reflux instructions we can provide. He feels that his pneumonia may have been related to poorly controlled acid reflux and/or regurgitation. His weight is down about 9 pounds since his last office visit but he had been sick with pneumonia for several weeks. He says he has follow-up with Dr.Gorsuch in February.  Review of Systems .Pertinent positive and negative review of systems were noted in the above HPI section.  All other review of systems was otherwise negative.  Outpatient Encounter Medications as of 04/05/2017  Medication Sig  . aspirin 81 MG tablet Take 81 mg by mouth  daily.  . finasteride (PROSCAR) 5 MG tablet Take 5 mg by mouth daily.  Marland Kitchen lactose free nutrition (BOOST) LIQD Take 237 mLs by mouth 2 (two) times daily between meals.  . montelukast (SINGULAIR) 10 MG tablet Take 10 mg by mouth at bedtime.  Marland Kitchen omeprazole (PRILOSEC) 20 MG capsule Take 1 capsule (20 mg total) 2 (two) times daily before a meal by mouth.  . polyethylene glycol (MIRALAX / GLYCOLAX) packet Take 17 g by mouth 2 (two) times daily.  . ranitidine (ZANTAC) 150 MG tablet Take 1 tablet (150 mg total) 2 (two) times daily by mouth.  . senna (SENOKOT) 8.6 MG TABS tablet Take 1 tablet by mouth at bedtime.  . temazepam (RESTORIL) 15 MG capsule Take 30 mg by mouth at bedtime.  Marland Kitchen venlafaxine XR (EFFEXOR-XR) 150 MG 24 hr capsule Take 1 capsule (150 mg total) daily with breakfast by mouth.  . [DISCONTINUED] guaiFENesin (MUCINEX) 600 MG 12 hr tablet Take 1 tablet (600 mg total) 2 (two) times daily by mouth.  . [DISCONTINUED] loratadine (CLARITIN) 10 MG tablet Take 10 mg by mouth daily.  . [DISCONTINUED] saccharomyces boulardii (FLORASTOR) 250 MG capsule Take 1 capsule (250 mg total) 2 (two) times daily by mouth.   No facility-administered encounter medications on file as of 04/05/2017.    Allergies  Allergen Reactions  . Cymbalta [Duloxetine Hcl]   . Remeron [Mirtazapine]   . Sulfa Antibiotics Hives  Patient Active Problem List   Diagnosis Date Noted  . Recurrent pneumonia 01/26/2017  . Christopher Fowler (major depressive disorder), single episode, moderate (Christopher Fowler) 01/26/2017  . Hyponatremia 01/25/2017  . Other constipation 07/31/2016  . Weight loss 07/03/2016  . Hypersensitivity reaction 06/05/2016  . Goals of care, counseling/discussion 05/24/2016  . Lymphoma, small lymphocytic (Christopher Fowler) 05/04/2016  . Pancytopenia, acquired (Christopher Fowler) 05/04/2016  . Gilbert's syndrome 10/29/2015  . Anemia due to chronic illness 10/29/2015  . Christopher Fowler (chronic lymphocytic leukemia) (Christopher Fowler) 10/28/2015   Social History   Socioeconomic  History  . Marital status: Widowed    Spouse name: Christopher Fowler  . Number of children: 1  . Years of education: Not on file  . Highest education level: Not on file  Social Needs  . Financial resource strain: Not on file  . Food insecurity - worry: Not on file  . Food insecurity - inability: Not on file  . Transportation needs - medical: Not on file  . Transportation needs - non-medical: Not on file  Occupational History  . 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 Smoking status: Never Smoker  . Smokeless tobacco: Never Used  Substance and Sexual Activity  . Alcohol use: Yes    Alcohol/week: 1.8 oz    Types: 3 Glasses of wine per week  . Drug use: No  . Sexual activity: No  Other Topics Concern  . Not on file  Social History Narrative   Widowed summer 2018   Friend's home, moved into assisted living summer 2018   Brother is Christopher Fowler    Mr. Christopher Fowler family history includes Breast cancer (age of onset: 57) in his mother; Cancer (age of onset: 71) in his paternal uncle.      Objective:    Vitals:   04/05/17 0959  BP: 124/70  Pulse: (!) 106    Physical Exam; well-developed elderly white male in no acute distress, very pleasant accompanied by his sister-in-law. Blood pressure 124/70 pulse 100, height 6 foot, weight 141, BMI 19.1. Weight was 150 at last office visit in October 2018 HEENT; nontraumatic normocephalic EOMI PERRLA sclera anicteric, Cardiovascular; regular rate and rhythm with S1-S2 no murmur or gallop, Pulmonary ;clear bilaterally, Abdomen ;soft, nontender nondistended no palpable mass or hepatosplenomegaly bowel sounds are present, Extremities; no clubbing cyanosis or edema skin warm and dry, Neuropsych; mood and affect appropriate       Assessment & Plan:   #38 84 year old white male with chronic GERD and recent exacerbation manifested with regurgitation and probable aspiration contributing to pneumonia which has  resolved. #2 weight loss-multifactorial, weight is down 9 pounds since last office visit the patient has been sick with pneumonia. He seems to be very pleased with his current living situation, food choices and is in good spirits today. #3 Christopher Fowler #4 major depression situational/grief  Plan; increase Ensure  to twice daily Continue omeprazole 20 mg by mouth twice a day Decrease Zantac to once daily at bedtime Continue strict anti-reflux regimen. He will either purchase a wedge pillow or a medcline pillow. Patient was given copy of an anti-reflux diet and antireflux instructions. We are happy to see him back at any point.   Amy S Esterwood PA-C 04/05/2017   Cc: Lajean Manes, MD  Agree with Ms. Genia Harold assessment and plan. Gatha Mayer, MD, [facility]

## 2017-04-05 NOTE — Patient Instructions (Addendum)
Take Omeprazole 20 mg, take 1 tablet by mouth twice daily.  Decrease the Zantac to 150 mg, take 1 tab daily at bedtime. Follow a strict antireflux regimen .  Look into getting a wedge pillow/ Medcline.  Drink 2 Ensure drinks per day.   We have provided you with antireflux information.   If you are age 82 or older, your body mass index should be between 23-30. Your Body mass index is 19.15 kg/m. If this is out of the aforementioned range listed, please consider follow up with your Primary Care Provider.

## 2017-04-06 DIAGNOSIS — R278 Other lack of coordination: Secondary | ICD-10-CM | POA: Diagnosis not present

## 2017-04-06 DIAGNOSIS — R2681 Unsteadiness on feet: Secondary | ICD-10-CM | POA: Diagnosis not present

## 2017-04-06 DIAGNOSIS — M6281 Muscle weakness (generalized): Secondary | ICD-10-CM | POA: Diagnosis not present

## 2017-04-09 DIAGNOSIS — R278 Other lack of coordination: Secondary | ICD-10-CM | POA: Diagnosis not present

## 2017-04-09 DIAGNOSIS — R2681 Unsteadiness on feet: Secondary | ICD-10-CM | POA: Diagnosis not present

## 2017-04-09 DIAGNOSIS — M6281 Muscle weakness (generalized): Secondary | ICD-10-CM | POA: Diagnosis not present

## 2017-04-11 DIAGNOSIS — R2681 Unsteadiness on feet: Secondary | ICD-10-CM | POA: Diagnosis not present

## 2017-04-11 DIAGNOSIS — M6281 Muscle weakness (generalized): Secondary | ICD-10-CM | POA: Diagnosis not present

## 2017-04-11 DIAGNOSIS — R278 Other lack of coordination: Secondary | ICD-10-CM | POA: Diagnosis not present

## 2017-04-12 DIAGNOSIS — R278 Other lack of coordination: Secondary | ICD-10-CM | POA: Diagnosis not present

## 2017-04-12 DIAGNOSIS — M6281 Muscle weakness (generalized): Secondary | ICD-10-CM | POA: Diagnosis not present

## 2017-04-12 DIAGNOSIS — R2681 Unsteadiness on feet: Secondary | ICD-10-CM | POA: Diagnosis not present

## 2017-04-13 DIAGNOSIS — E440 Moderate protein-calorie malnutrition: Secondary | ICD-10-CM | POA: Diagnosis not present

## 2017-04-13 DIAGNOSIS — E222 Syndrome of inappropriate secretion of antidiuretic hormone: Secondary | ICD-10-CM | POA: Diagnosis not present

## 2017-04-13 DIAGNOSIS — R278 Other lack of coordination: Secondary | ICD-10-CM | POA: Diagnosis not present

## 2017-04-13 DIAGNOSIS — R2681 Unsteadiness on feet: Secondary | ICD-10-CM | POA: Diagnosis not present

## 2017-04-13 DIAGNOSIS — F322 Major depressive disorder, single episode, severe without psychotic features: Secondary | ICD-10-CM | POA: Diagnosis not present

## 2017-04-13 DIAGNOSIS — M6281 Muscle weakness (generalized): Secondary | ICD-10-CM | POA: Diagnosis not present

## 2017-04-13 DIAGNOSIS — K219 Gastro-esophageal reflux disease without esophagitis: Secondary | ICD-10-CM | POA: Diagnosis not present

## 2017-04-13 DIAGNOSIS — I10 Essential (primary) hypertension: Secondary | ICD-10-CM | POA: Diagnosis not present

## 2017-04-13 DIAGNOSIS — J3089 Other allergic rhinitis: Secondary | ICD-10-CM | POA: Diagnosis not present

## 2017-04-16 DIAGNOSIS — R2681 Unsteadiness on feet: Secondary | ICD-10-CM | POA: Diagnosis not present

## 2017-04-16 DIAGNOSIS — M6281 Muscle weakness (generalized): Secondary | ICD-10-CM | POA: Diagnosis not present

## 2017-04-16 DIAGNOSIS — R278 Other lack of coordination: Secondary | ICD-10-CM | POA: Diagnosis not present

## 2017-04-17 DIAGNOSIS — M6281 Muscle weakness (generalized): Secondary | ICD-10-CM | POA: Diagnosis not present

## 2017-04-17 DIAGNOSIS — R278 Other lack of coordination: Secondary | ICD-10-CM | POA: Diagnosis not present

## 2017-04-17 DIAGNOSIS — R2681 Unsteadiness on feet: Secondary | ICD-10-CM | POA: Diagnosis not present

## 2017-04-18 DIAGNOSIS — R338 Other retention of urine: Secondary | ICD-10-CM | POA: Diagnosis not present

## 2017-04-18 DIAGNOSIS — C9100 Acute lymphoblastic leukemia not having achieved remission: Secondary | ICD-10-CM | POA: Diagnosis not present

## 2017-04-18 DIAGNOSIS — E871 Hypo-osmolality and hyponatremia: Secondary | ICD-10-CM | POA: Diagnosis not present

## 2017-04-18 DIAGNOSIS — F4320 Adjustment disorder, unspecified: Secondary | ICD-10-CM | POA: Diagnosis not present

## 2017-04-18 DIAGNOSIS — K4090 Unilateral inguinal hernia, without obstruction or gangrene, not specified as recurrent: Secondary | ICD-10-CM | POA: Diagnosis not present

## 2017-04-18 DIAGNOSIS — Z1389 Encounter for screening for other disorder: Secondary | ICD-10-CM | POA: Diagnosis not present

## 2017-04-18 DIAGNOSIS — J3089 Other allergic rhinitis: Secondary | ICD-10-CM | POA: Diagnosis not present

## 2017-04-18 DIAGNOSIS — Z681 Body mass index (BMI) 19 or less, adult: Secondary | ICD-10-CM | POA: Diagnosis not present

## 2017-04-18 DIAGNOSIS — K5909 Other constipation: Secondary | ICD-10-CM | POA: Diagnosis not present

## 2017-04-18 DIAGNOSIS — D6489 Other specified anemias: Secondary | ICD-10-CM | POA: Diagnosis not present

## 2017-04-18 DIAGNOSIS — D692 Other nonthrombocytopenic purpura: Secondary | ICD-10-CM | POA: Diagnosis not present

## 2017-04-18 DIAGNOSIS — E538 Deficiency of other specified B group vitamins: Secondary | ICD-10-CM | POA: Diagnosis not present

## 2017-04-19 DIAGNOSIS — R278 Other lack of coordination: Secondary | ICD-10-CM | POA: Diagnosis not present

## 2017-04-19 DIAGNOSIS — M6281 Muscle weakness (generalized): Secondary | ICD-10-CM | POA: Diagnosis not present

## 2017-04-19 DIAGNOSIS — R2681 Unsteadiness on feet: Secondary | ICD-10-CM | POA: Diagnosis not present

## 2017-04-20 DIAGNOSIS — R278 Other lack of coordination: Secondary | ICD-10-CM | POA: Diagnosis not present

## 2017-04-20 DIAGNOSIS — M6281 Muscle weakness (generalized): Secondary | ICD-10-CM | POA: Diagnosis not present

## 2017-04-20 DIAGNOSIS — R2681 Unsteadiness on feet: Secondary | ICD-10-CM | POA: Diagnosis not present

## 2017-04-23 DIAGNOSIS — R2681 Unsteadiness on feet: Secondary | ICD-10-CM | POA: Diagnosis not present

## 2017-04-23 DIAGNOSIS — R278 Other lack of coordination: Secondary | ICD-10-CM | POA: Diagnosis not present

## 2017-04-23 DIAGNOSIS — M6281 Muscle weakness (generalized): Secondary | ICD-10-CM | POA: Diagnosis not present

## 2017-04-24 DIAGNOSIS — M6281 Muscle weakness (generalized): Secondary | ICD-10-CM | POA: Diagnosis not present

## 2017-04-24 DIAGNOSIS — R2681 Unsteadiness on feet: Secondary | ICD-10-CM | POA: Diagnosis not present

## 2017-04-24 DIAGNOSIS — R278 Other lack of coordination: Secondary | ICD-10-CM | POA: Diagnosis not present

## 2017-04-25 DIAGNOSIS — Z85828 Personal history of other malignant neoplasm of skin: Secondary | ICD-10-CM | POA: Diagnosis not present

## 2017-04-25 DIAGNOSIS — R2681 Unsteadiness on feet: Secondary | ICD-10-CM | POA: Diagnosis not present

## 2017-04-25 DIAGNOSIS — D1801 Hemangioma of skin and subcutaneous tissue: Secondary | ICD-10-CM | POA: Diagnosis not present

## 2017-04-25 DIAGNOSIS — R278 Other lack of coordination: Secondary | ICD-10-CM | POA: Diagnosis not present

## 2017-04-25 DIAGNOSIS — M6281 Muscle weakness (generalized): Secondary | ICD-10-CM | POA: Diagnosis not present

## 2017-04-25 DIAGNOSIS — L821 Other seborrheic keratosis: Secondary | ICD-10-CM | POA: Diagnosis not present

## 2017-04-25 DIAGNOSIS — C44319 Basal cell carcinoma of skin of other parts of face: Secondary | ICD-10-CM | POA: Diagnosis not present

## 2017-04-25 DIAGNOSIS — D044 Carcinoma in situ of skin of scalp and neck: Secondary | ICD-10-CM | POA: Diagnosis not present

## 2017-04-25 DIAGNOSIS — L814 Other melanin hyperpigmentation: Secondary | ICD-10-CM | POA: Diagnosis not present

## 2017-04-25 DIAGNOSIS — L570 Actinic keratosis: Secondary | ICD-10-CM | POA: Diagnosis not present

## 2017-04-26 DIAGNOSIS — R2681 Unsteadiness on feet: Secondary | ICD-10-CM | POA: Diagnosis not present

## 2017-04-26 DIAGNOSIS — M6281 Muscle weakness (generalized): Secondary | ICD-10-CM | POA: Diagnosis not present

## 2017-04-26 DIAGNOSIS — R278 Other lack of coordination: Secondary | ICD-10-CM | POA: Diagnosis not present

## 2017-05-01 DIAGNOSIS — R278 Other lack of coordination: Secondary | ICD-10-CM | POA: Diagnosis not present

## 2017-05-01 DIAGNOSIS — M6281 Muscle weakness (generalized): Secondary | ICD-10-CM | POA: Diagnosis not present

## 2017-05-01 DIAGNOSIS — R2681 Unsteadiness on feet: Secondary | ICD-10-CM | POA: Diagnosis not present

## 2017-05-02 DIAGNOSIS — E538 Deficiency of other specified B group vitamins: Secondary | ICD-10-CM | POA: Diagnosis not present

## 2017-05-02 DIAGNOSIS — M6281 Muscle weakness (generalized): Secondary | ICD-10-CM | POA: Diagnosis not present

## 2017-05-02 DIAGNOSIS — R2681 Unsteadiness on feet: Secondary | ICD-10-CM | POA: Diagnosis not present

## 2017-05-02 DIAGNOSIS — R278 Other lack of coordination: Secondary | ICD-10-CM | POA: Diagnosis not present

## 2017-05-03 ENCOUNTER — Inpatient Hospital Stay: Payer: Medicare Other | Attending: Hematology and Oncology | Admitting: Hematology and Oncology

## 2017-05-03 ENCOUNTER — Inpatient Hospital Stay: Payer: Medicare Other

## 2017-05-03 ENCOUNTER — Encounter: Payer: Self-pay | Admitting: Hematology and Oncology

## 2017-05-03 ENCOUNTER — Telehealth: Payer: Self-pay | Admitting: Hematology and Oncology

## 2017-05-03 DIAGNOSIS — C9110 Chronic lymphocytic leukemia of B-cell type not having achieved remission: Secondary | ICD-10-CM

## 2017-05-03 DIAGNOSIS — R2681 Unsteadiness on feet: Secondary | ICD-10-CM | POA: Diagnosis not present

## 2017-05-03 DIAGNOSIS — Z7982 Long term (current) use of aspirin: Secondary | ICD-10-CM | POA: Diagnosis not present

## 2017-05-03 DIAGNOSIS — L989 Disorder of the skin and subcutaneous tissue, unspecified: Secondary | ICD-10-CM | POA: Insufficient documentation

## 2017-05-03 DIAGNOSIS — Z9221 Personal history of antineoplastic chemotherapy: Secondary | ICD-10-CM | POA: Diagnosis not present

## 2017-05-03 DIAGNOSIS — Z79899 Other long term (current) drug therapy: Secondary | ICD-10-CM | POA: Diagnosis not present

## 2017-05-03 DIAGNOSIS — R278 Other lack of coordination: Secondary | ICD-10-CM | POA: Diagnosis not present

## 2017-05-03 DIAGNOSIS — M6281 Muscle weakness (generalized): Secondary | ICD-10-CM | POA: Diagnosis not present

## 2017-05-03 LAB — CBC WITH DIFFERENTIAL/PLATELET
BASOS ABS: 0.1 10*3/uL (ref 0.0–0.1)
Basophils Relative: 1 %
Eosinophils Absolute: 0.1 10*3/uL (ref 0.0–0.5)
Eosinophils Relative: 2 %
HEMATOCRIT: 36.5 % — AB (ref 38.4–49.9)
HEMOGLOBIN: 12.4 g/dL — AB (ref 13.0–17.1)
LYMPHS ABS: 2.8 10*3/uL (ref 0.9–3.3)
LYMPHS PCT: 31 %
MCH: 31 pg (ref 27.2–33.4)
MCHC: 34 g/dL (ref 32.0–36.0)
MCV: 91.3 fL (ref 79.3–98.0)
Monocytes Absolute: 1 10*3/uL — ABNORMAL HIGH (ref 0.1–0.9)
Monocytes Relative: 11 %
NEUTROS ABS: 5 10*3/uL (ref 1.5–6.5)
Neutrophils Relative %: 55 %
Platelets: 261 10*3/uL (ref 140–400)
RBC: 4 MIL/uL — AB (ref 4.20–5.82)
RDW: 14.6 % (ref 11.0–14.6)
WBC: 8.9 10*3/uL (ref 4.0–10.3)
nRBC: 0 /100 WBC

## 2017-05-03 LAB — COMPREHENSIVE METABOLIC PANEL
ALBUMIN: 3.6 g/dL (ref 3.5–5.0)
ALT: 12 U/L (ref 0–55)
ANION GAP: 8 (ref 3–11)
AST: 24 U/L (ref 5–34)
Alkaline Phosphatase: 95 U/L (ref 40–150)
BILIRUBIN TOTAL: 0.8 mg/dL (ref 0.2–1.2)
BUN: 22 mg/dL (ref 7–26)
CHLORIDE: 99 mmol/L (ref 98–109)
CO2: 29 mmol/L (ref 22–29)
Calcium: 8.5 mg/dL (ref 8.4–10.4)
Creatinine, Ser: 0.95 mg/dL (ref 0.70–1.30)
GFR calc Af Amer: >60 mL/min (ref >60)
GFR calc non Af Amer: >60 mL/min (ref >60)
GLUCOSE: 111 mg/dL (ref 70–140)
POTASSIUM: 4.1 mmol/L (ref 3.5–5.1)
SODIUM: 136 mmol/L (ref 136–145)
TOTAL PROTEIN: 6.5 g/dL (ref 6.4–8.3)

## 2017-05-03 NOTE — Telephone Encounter (Signed)
Gave avs and calendar for august  °

## 2017-05-03 NOTE — Progress Notes (Signed)
[facility] OFFICE PROGRESS NOTE  Patient Care Team: Crist Infante, MD as PCP - General (Internal Medicine)  SUMMARY OF ONCOLOGIC HISTORY:   CLL (chronic lymphocytic leukemia) ([place])   10/28/2015 Pathology Results    Flow cytometry confirmed CLL      10/28/2015 Tumor Marker    FISH panel confirmed del 13 q      05/24/2016 PET scan    There is a mildly prominent AP window lymph node and numerous small axillary lymph nodes, but these are not hypermetabolic beyond the mediastinal blood pool activity. 2. Mild splenomegaly, without abnormal splenic activity. 3. Other imaging findings of potential clinical significance: Chronic left frontal and right maxillary sinusitis. Coronary, aortic arch, and branch vessel atherosclerotic vascular disease. Aortoiliac atherosclerotic vascular disease. Mildly prominent prostate gland. Right scrotal hydrocele. Spondylosis, with postoperative findings in the lumbar spine. Degenerative arthropathy of both hips.      06/05/2016 - 10/23/2016 Chemotherapy    He was started on Gazyva      08/25/2016 PET scan    1. The AP window lymph node is slightly smaller at 1.1 cm, and remains of similar activity to background blood pool. 2. Prior splenomegaly has resolved. Normal splenic activity. 3. No current active hypermetabolic activity to suggest active malignancy. 4. Other imaging findings of potential clinical significance: Aortic Atherosclerosis (ICD10-I70.0). Coronary atherosclerosis. Prominent stool throughout the colon favors constipation. Prostatomegaly. Right scrotal hydrocele. Mild chronic left frontal sinusitis. Biapical pleuroparenchymal scarring.      03/14/2017 Imaging    1. Overall improvement in right lower lobe consolidation consistent with improving pneumonia. Again, the associated high density supports aspiration as the etiology, and there is a new cavitary component which may reflect a small abscess. Bilateral pleural effusions have  improved. 2. No new airspace disease. 3. No adenopathy. 4. 4.4 cm ascending aortic aneurysm.        INTERVAL HISTORY: Please see below for problem oriented charting. He returns for further follow-up He is currently in independent living community He denies recent infection He is following closely with dermatologist for skin cancer check Appetite is stable, no recent weight loss No recent lymphadenopathy  REVIEW OF SYSTEMS:   Constitutional: Denies fevers, chills or abnormal weight loss Eyes: Denies blurriness of vision Ears, nose, mouth, throat, and face: Denies mucositis or sore throat Respiratory: Denies cough, dyspnea or wheezes Cardiovascular: Denies palpitation, chest discomfort or lower extremity swelling Gastrointestinal:  Denies nausea, heartburn or change in bowel habits Lymphatics: Denies new lymphadenopathy or easy bruising Neurological:Denies numbness, tingling or new weaknesses Behavioral/Psych: Mood is stable, no new changes  All other systems were reviewed with the patient and are negative.  I have reviewed the past medical history, past surgical history, social history and family history with the patient and they are unchanged from previous note.  ALLERGIES:  is allergic to cymbalta [duloxetine hcl]; remeron [mirtazapine]; and sulfa antibiotics.  MEDICATIONS:  Current Outpatient Medications  Medication Sig Dispense Refill  . aspirin 81 MG tablet Take 81 mg by mouth daily.    . finasteride (PROSCAR) 5 MG tablet Take 5 mg by mouth daily.    Marland Kitchen lactose free nutrition (BOOST) LIQD Take 237 mLs by mouth 2 (two) times daily between meals.    . montelukast (SINGULAIR) 10 MG tablet Take 10 mg by mouth at bedtime.    Marland Kitchen omeprazole (PRILOSEC) 20 MG capsule Take 1 capsule (20 mg total) 2 (two) times daily before a meal by mouth. 60 capsule 0  . polyethylene  glycol (MIRALAX / GLYCOLAX) packet Take 17 g by mouth 2 (two) times daily.    . ranitidine (ZANTAC) 150 MG tablet  Take 1 tablet (150 mg total) 2 (two) times daily by mouth. 60 tablet 0  . senna (SENOKOT) 8.6 MG TABS tablet Take 1 tablet by mouth at bedtime.    . temazepam (RESTORIL) 15 MG capsule Take 30 mg by mouth at bedtime.    Marland Kitchen venlafaxine XR (EFFEXOR-XR) 150 MG 24 hr capsule Take 1 capsule (150 mg total) daily with breakfast by mouth. 30 capsule 0   No current facility-administered medications for this visit.     PHYSICAL EXAMINATION: ECOG PERFORMANCE STATUS: 1 - Symptomatic but completely ambulatory  Vitals:   05/03/17 1440  BP: 132/77  Pulse: (!) 102  Resp: 18  Temp: 98.3 F (36.8 C)  SpO2: 99%   Filed Weights   05/03/17 1440  Weight: 144 lb 14.4 oz (65.7 kg)    GENERAL:alert, no distress and comfortable SKIN: He has multiple seborrheic keratosis and suspicious lesion behind his ear and left forehead EYES: normal, Conjunctiva are pink and non-injected, sclera clear OROPHARYNX:no exudate, no erythema and lips, buccal mucosa, and tongue normal  NECK: supple, thyroid normal size, non-tender, without nodularity LYMPH:  no palpable lymphadenopathy in the cervical, axillary or inguinal LUNGS: clear to auscultation and percussion with normal breathing effort HEART: regular rate & rhythm and no murmurs and no lower extremity edema ABDOMEN:abdomen soft, non-tender and normal bowel sounds Musculoskeletal:no cyanosis of digits and no clubbing  NEURO: alert & oriented x 3 with fluent speech, no focal motor/sensory deficits  LABORATORY DATA:  I have reviewed the data as listed    Component Value Date/Time   NA 136 05/03/2017 1415   NA 136 10/19/2016 1233   K 4.1 05/03/2017 1415   K 4.1 10/19/2016 1233   CL 99 05/03/2017 1415   CO2 29 05/03/2017 1415   CO2 28 10/19/2016 1233   GLUCOSE 111 05/03/2017 1415   GLUCOSE 93 10/19/2016 1233   BUN 22 05/03/2017 1415   BUN 18.4 10/19/2016 1233   CREATININE 0.95 05/03/2017 1415   CREATININE 1.1 10/19/2016 1233   CALCIUM 8.5 05/03/2017 1415    CALCIUM 8.8 10/19/2016 1233   PROT 6.5 05/03/2017 1415   PROT 6.7 10/19/2016 1233   ALBUMIN 3.6 05/03/2017 1415   ALBUMIN 3.9 10/19/2016 1233   AST 24 05/03/2017 1415   AST 30 10/19/2016 1233   ALT 12 05/03/2017 1415   ALT 15 10/19/2016 1233   ALKPHOS 95 05/03/2017 1415   ALKPHOS 57 10/19/2016 1233   BILITOT 0.8 05/03/2017 1415   BILITOT 2.05 (H) 10/19/2016 1233   GFRNONAA >60 05/03/2017 1415   GFRAA >60 05/03/2017 1415    No results found for: SPEP, UPEP  Lab Results  Component Value Date   WBC 8.9 05/03/2017   NEUTROABS 5.0 05/03/2017   HGB 12.4 (L) 05/03/2017   HCT 36.5 (L) 05/03/2017   MCV 91.3 05/03/2017   PLT 261 05/03/2017      Chemistry      Component Value Date/Time   NA 136 05/03/2017 1415   NA 136 10/19/2016 1233   K 4.1 05/03/2017 1415   K 4.1 10/19/2016 1233   CL 99 05/03/2017 1415   CO2 29 05/03/2017 1415   CO2 28 10/19/2016 1233   BUN 22 05/03/2017 1415   BUN 18.4 10/19/2016 1233   CREATININE 0.95 05/03/2017 1415   CREATININE 1.1 10/19/2016 1233  Component Value Date/Time   CALCIUM 8.5 05/03/2017 1415   CALCIUM 8.8 10/19/2016 1233   ALKPHOS 95 05/03/2017 1415   ALKPHOS 57 10/19/2016 1233   AST 24 05/03/2017 1415   AST 30 10/19/2016 1233   ALT 12 05/03/2017 1415   ALT 15 10/19/2016 1233   BILITOT 0.8 05/03/2017 1415   BILITOT 2.05 (H) 10/19/2016 1233      ASSESSMENT & PLAN:  CLL (chronic lymphocytic leukemia) ([place]) He has no clinical signs of disease relapse I plan to see him back in 6 months with repeat history, physical examination and blood work.  Skin lesion He is following closely with dermatologist for monitoring and removal of skin lesions The patient is susceptible for skin cancer due to his diagnosis of CLL He will continue aggressive skin protection and close follow-up with dermatologist   No orders of the defined types were placed in this encounter.  All questions were answered. The patient knows to call the clinic  with any problems, questions or concerns. No barriers to learning was detected. I spent 10 minutes counseling the patient face to face. The total time spent in the appointment was 15 minutes and more than 50% was on counseling and review of test results     [name], MD 05/03/2017 4:11 PM

## 2017-05-03 NOTE — Assessment & Plan Note (Signed)
He has no clinical signs of disease relapse I plan to see him back in 6 months with repeat history, physical examination and blood work.

## 2017-05-03 NOTE — Assessment & Plan Note (Signed)
He is following closely with dermatologist for monitoring and removal of skin lesions The patient is susceptible for skin cancer due to his diagnosis of CLL He will continue aggressive skin protection and close follow-up with dermatologist

## 2017-05-04 DIAGNOSIS — Z85828 Personal history of other malignant neoplasm of skin: Secondary | ICD-10-CM | POA: Diagnosis not present

## 2017-05-04 DIAGNOSIS — D044 Carcinoma in situ of skin of scalp and neck: Secondary | ICD-10-CM | POA: Diagnosis not present

## 2017-05-07 DIAGNOSIS — R278 Other lack of coordination: Secondary | ICD-10-CM | POA: Diagnosis not present

## 2017-05-07 DIAGNOSIS — M6281 Muscle weakness (generalized): Secondary | ICD-10-CM | POA: Diagnosis not present

## 2017-05-07 DIAGNOSIS — R2681 Unsteadiness on feet: Secondary | ICD-10-CM | POA: Diagnosis not present

## 2017-05-09 DIAGNOSIS — R2681 Unsteadiness on feet: Secondary | ICD-10-CM | POA: Diagnosis not present

## 2017-05-09 DIAGNOSIS — R278 Other lack of coordination: Secondary | ICD-10-CM | POA: Diagnosis not present

## 2017-05-09 DIAGNOSIS — M6281 Muscle weakness (generalized): Secondary | ICD-10-CM | POA: Diagnosis not present

## 2017-05-17 DIAGNOSIS — C44329 Squamous cell carcinoma of skin of other parts of face: Secondary | ICD-10-CM | POA: Diagnosis not present

## 2017-05-17 DIAGNOSIS — Z85828 Personal history of other malignant neoplasm of skin: Secondary | ICD-10-CM | POA: Diagnosis not present

## 2017-05-22 ENCOUNTER — Ambulatory Visit: Payer: Self-pay | Admitting: Physician Assistant

## 2017-05-24 ENCOUNTER — Encounter: Payer: Self-pay | Admitting: Physician Assistant

## 2017-05-24 ENCOUNTER — Ambulatory Visit (INDEPENDENT_AMBULATORY_CARE_PROVIDER_SITE_OTHER): Payer: Medicare Other | Admitting: Physician Assistant

## 2017-05-24 VITALS — BP 118/74 | HR 88 | Ht 72.0 in | Wt 144.0 lb

## 2017-05-24 DIAGNOSIS — K219 Gastro-esophageal reflux disease without esophagitis: Secondary | ICD-10-CM

## 2017-05-24 MED ORDER — OMEPRAZOLE 40 MG PO CPDR: DELAYED_RELEASE_CAPSULE | ORAL | 11 refills | Status: DC

## 2017-05-24 NOTE — Patient Instructions (Signed)
We sent a prescription to [facility]. 1. Omeprazole 40 mg- take 1 tablet twice daily.   May use Mylanta 30 cc as needed.  Continue Anti-refkux regimine.   If you are age 82 or older, your body mass index should be between 23-30. Your Body mass index is 19.53 kg/m. If this is out of the aforementioned range listed, please consider follow up with your Primary Care Provider.

## 2017-05-24 NOTE — Progress Notes (Addendum)
Subjective:    Patient ID: Christopher Fowler., male    DOB: 20-Sep-1924, 84 y.o.   MRN: [id number]  HPI  Christopher Fowler is a very nice 84 year old white male, known to Dr. Carlean Purl. He was last seen in the office on 04/05/2017 by myself, at that time with complaints of GERD and weight loss. He had had a recent episode of possible aspiration-related pneumonia. He had been seen for upper endoscopy in November 2018 which was a normal exam. At the time of last office visit symptoms well felt secondary to chronic GERD and recent exacerbation with regurgitation and possible aspiration which had resolved. His weight loss was felt to be multifactorial after illness with recent pneumonia. We continued him on omeprazole 20 mg twice daily, decreased Zantac to bedtime dosing we discussed a strict antireflux regimen, and elevation of the head of the bed. Also suggested he start ensure twice daily to help with weight gain. Comes in  today for follow-up. Overall he has been doing well. He says he continues to have intermittent symptoms but these are not occurring on a daily basis. He sometimes gets a "flareup" after meals. This seems to be more of a sensation of reflux or sour brash than heartburn. He has an extensive list with him today and has multiple questions about food choices which we discussed at great length. There also appears to be some confusion about his medication as he feels that he's been taking Zantac twice daily and has not been on omeprazole.   Review of Systems Pertinent positive and negative review of systems were noted in the above HPI section.  All other review of systems was otherwise negative.  Outpatient Encounter Medications as of 05/24/2017  Medication Sig  . aspirin 81 MG tablet Take 81 mg by mouth daily.  . finasteride (PROSCAR) 5 MG tablet Take 5 mg by mouth daily.  Marland Kitchen lactose free nutrition (BOOST) LIQD Take 237 mLs by mouth 2 (two) times daily between meals.  . montelukast (SINGULAIR) 10  MG tablet Take 10 mg by mouth at bedtime.  Marland Kitchen omeprazole (PRILOSEC) 20 MG capsule Take 1 capsule (20 mg total) 2 (two) times daily before a meal by mouth.  . polyethylene glycol (MIRALAX / GLYCOLAX) packet Take 17 g by mouth 2 (two) times daily.  . ranitidine (ZANTAC) 150 MG tablet Take 1 tablet (150 mg total) 2 (two) times daily by mouth.  . temazepam (RESTORIL) 15 MG capsule Take 30 mg by mouth at bedtime.  Marland Kitchen venlafaxine XR (EFFEXOR-XR) 150 MG 24 hr capsule Take 1 capsule (150 mg total) daily with breakfast by mouth.  Marland Kitchen omeprazole (PRILOSEC) 40 MG capsule Take 1 tab twice daily before breakfast and before dinner.  . senna (SENOKOT) 8.6 MG TABS tablet Take 1 tablet by mouth at bedtime.   No facility-administered encounter medications on file as of 05/24/2017.    Allergies  Allergen Reactions  . Cymbalta [Duloxetine Hcl]   . Remeron [Mirtazapine]   . Sulfa Antibiotics Hives   Patient Active Problem List   Diagnosis Date Noted  . Skin lesion 05/03/2017  . Recurrent pneumonia 01/26/2017  . MDD (major depressive disorder), single episode, moderate ([place]) 01/26/2017  . Hyponatremia 01/25/2017  . Other constipation 07/31/2016  . Weight loss 07/03/2016  . Hypersensitivity reaction 06/05/2016  . Goals of care, counseling/discussion 05/24/2016  . Lymphoma, small lymphocytic ([place]) 05/04/2016  . Pancytopenia, acquired ([place]) 05/04/2016  . Gilbert's syndrome 10/29/2015  . Anemia due to chronic illness 10/29/2015  .  CLL (chronic lymphocytic leukemia) ([place]) 10/28/2015   Social History   Socioeconomic History  . Marital status: Widowed    Spouse name: Christopher Fowler  . Number of children: 1  . Years of education: Not on file  . Highest education level: Not on file  Social Needs  . Financial resource strain: Not on file  . Food insecurity - worry: Not on file  . Food insecurity - inability: Not on file  . Transportation needs - medical: Not on file  . Transportation needs - non-medical: Not on  file  Occupational History  . 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 Smoking status: Never Smoker  . Smokeless tobacco: Never Used  Substance and Sexual Activity  . Alcohol use: Yes    Alcohol/week: 1.8 oz    Types: 3 Glasses of wine per week  . Drug use: No  . Sexual activity: No  Other Topics Concern  . Not on file  Social History Narrative   Widowed summer 2018   Friend's home, moved into assisted living summer 2018   Brother is Christopher Fowler    Mr. Nall family history includes Breast cancer (age of onset: 70) in his mother; Cancer (age of onset: 4) in his paternal uncle.      Objective:    Vitals:   05/24/17 0938  BP: 118/74  Pulse: 88    Physical Exam well-developed elderly white male in no acute distress, very pleasant accompanied by family member blood pressure 118/74 pulse 88, weight is 144 today., Up 3 pounds from last office visit HEENT; nontraumatic normocephalic EOMI PERRLA he has a bandage over his left forehead, Not further examined today discussion only       Assessment & Plan:   #72 84 year old white male with chronic GERD, symptomatic with sour brash. Negative EGD November 2018. Symptoms are improving, not occurring daily. Patient very focused on his dietary intake, diet was discussed at length today. Unclear whether he's been getting omeprazole twice daily or whether his assisted living facility has been giving him Zantac twice daily. We will clarify this.   #2 Weight loss, no further weight loss since last office visit and Up 3 pounds #3 CLL #4 major depression/situational grief improved  Plan continue strict antireflux regimen, He does have the head of his bed elevated, is using an incline pillow at this point Would like him to stay on omeprazole 40 mg by mouth twice daily, new prescription will be called Continue ensure twice daily Patient will follow-up with Dr. Carlean Purl or myself on an as-needed  basis.  Greater than 50% of office visit was spent in counseling regarding management of reflux and dietary management for GERD.  Amy S Esterwood PA-C 05/24/2017   Cc: Crist Infante, MD   Agree with Ms. Genia Harold assessment and plan. Gatha Mayer, MD, [facility]

## 2017-05-30 DIAGNOSIS — J3089 Other allergic rhinitis: Secondary | ICD-10-CM | POA: Diagnosis not present

## 2017-05-30 DIAGNOSIS — K219 Gastro-esophageal reflux disease without esophagitis: Secondary | ICD-10-CM | POA: Diagnosis not present

## 2017-05-30 DIAGNOSIS — C9100 Acute lymphoblastic leukemia not having achieved remission: Secondary | ICD-10-CM | POA: Diagnosis not present

## 2017-05-30 DIAGNOSIS — E538 Deficiency of other specified B group vitamins: Secondary | ICD-10-CM | POA: Diagnosis not present

## 2017-05-30 DIAGNOSIS — Z681 Body mass index (BMI) 19 or less, adult: Secondary | ICD-10-CM | POA: Diagnosis not present

## 2017-05-31 DIAGNOSIS — R8271 Bacteriuria: Secondary | ICD-10-CM | POA: Diagnosis not present

## 2017-05-31 DIAGNOSIS — R8279 Other abnormal findings on microbiological examination of urine: Secondary | ICD-10-CM | POA: Diagnosis not present

## 2017-05-31 DIAGNOSIS — R3914 Feeling of incomplete bladder emptying: Secondary | ICD-10-CM | POA: Diagnosis not present

## 2017-06-04 DIAGNOSIS — F411 Generalized anxiety disorder: Secondary | ICD-10-CM | POA: Diagnosis not present

## 2017-06-28 DIAGNOSIS — E538 Deficiency of other specified B group vitamins: Secondary | ICD-10-CM | POA: Diagnosis not present

## 2017-08-01 DIAGNOSIS — E538 Deficiency of other specified B group vitamins: Secondary | ICD-10-CM | POA: Diagnosis not present

## 2017-08-06 ENCOUNTER — Telehealth: Payer: Self-pay | Admitting: *Deleted

## 2017-08-06 ENCOUNTER — Encounter: Payer: Self-pay | Admitting: Physician Assistant

## 2017-08-06 ENCOUNTER — Ambulatory Visit (INDEPENDENT_AMBULATORY_CARE_PROVIDER_SITE_OTHER): Payer: Medicare Other | Admitting: Physician Assistant

## 2017-08-06 VITALS — BP 128/76 | HR 96 | Ht 72.0 in | Wt 146.4 lb

## 2017-08-06 DIAGNOSIS — K219 Gastro-esophageal reflux disease without esophagitis: Secondary | ICD-10-CM | POA: Diagnosis not present

## 2017-08-06 MED ORDER — AMBULATORY NON FORMULARY MEDICATION: 6 refills | Status: DC

## 2017-08-06 MED ORDER — AMBULATORY NON FORMULARY MEDICATION: 6 refills | Status: AC

## 2017-08-06 NOTE — Telephone Encounter (Signed)
Called [facility] and they did not have the Dicyclomine 10mg /3ml.  I called [facility]. 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  He does have the 90 ml of the Dicyclomine 10mg /59ml . I faxed him the prescription. Called the patient to advise this, he was not in. Will call him later.

## 2017-08-06 NOTE — Telephone Encounter (Signed)
Called the [facility]. They are working on the prescription , should be ready in about an hour, 3:30 Pm.  I called the patient and he said his son will take him over later this afternoon.  He thanked me for checking on the prescription for him.

## 2017-08-06 NOTE — Patient Instructions (Signed)
If you are age 82 or older, your body mass index should be between 23-30. Your Body mass index is 19.85 kg/m. If this is out of the aforementioned range listed, please consider follow up with your Primary Care Provider.  You have been scheduled for a Barium Esophogram at [facility] (1st floor of the hospital) on Thursday 5-16 at 1:30 PM. Please arrive 15 minutes prior to your appointment for registration. Make certain not to have anything to eat or drink after midnight prior to your test. If you need to reschedule for any reason, please contact radiology at [phone] to do so.  Continue Dexilant 60 mg every morning. Stop the Carafate. Continue anti-reflux diet.  We have given you a prescription for GI Cocktail to take to your pharmacy.  __________________________________________________________________ A barium swallow is an examination that concentrates on views of the esophagus. This tends to be a double contrast exam (barium and two liquids which, when combined, create a gas to distend the wall of the oesophagus) or single contrast (non-ionic iodine based). The study is usually tailored to your symptoms so a good history is essential. Attention is paid during the study to the form, structure and configuration of the esophagus, looking for functional disorders (such as aspiration, dysphagia, achalasia, motility and reflux) EXAMINATION You may be asked to change into a gown, depending on the type of swallow being performed. A radiologist and radiographer will perform the procedure. The radiologist will advise you of the type of contrast selected for your procedure and direct you during the exam. You will be asked to stand, sit or lie in several different positions and to hold a small amount of fluid in your mouth before being asked to swallow while the imaging is performed .In some instances you may be asked to swallow barium coated marshmallows to assess the motility of a solid food  bolus. The exam can be recorded as a digital or video fluoroscopy procedure. POST PROCEDURE It will take 1-2 days for the barium to pass through your system. To facilitate this, it is important, unless otherwise directed, to increase your fluids for the next 24-48hrs and to resume your normal diet.  This test typically takes about 30 minutes to perform. __________________________________________________________________________________

## 2017-08-06 NOTE — Progress Notes (Addendum)
Subjective:    Patient ID: Christopher Fowler., male    DOB: 08-01-1924, 84 y.o.   MRN: [id number]  HPI Christopher Fowler is a very nice 84 year old white male, known to Dr. Carlean Purl and myself.  His PCP is Dr. Joylene Draft. He was last seen in our office in February 2019 with complaints of GERD and sour brash.  At that time he was placed on omeprazole 40 mg p.o. twice daily. And undergone EGD in November 2018 which was a normal exam.  Chest CT in December 2018 showed a 4.4 cm a sending aorta aneurysm, no adenopathy. Patient comes back in today stating that he still having problems with GERD.  He has been seen by Dr. Haynes Kerns in the interim and was switched to Dexilant 60 mg p.o. every morning and was placed on Carafate 1 g 4 times daily.  Exline He has been sleeping with the head of the bed elevated and has been following a very strict antireflux regimen.  He says he usually feels okay early in the morning and then by midday starts developing symptoms after lunch.  Is not having any heartburn or indigestion but has more of a sensation of gurgling and fullness in his upper esophagus and feels as if he would have fluid flow out if he would bend over. No dysphagia or odynophagia.  He also has been having some difficulty swallowing the Carafate tablets, even with breaking them up feels as if there is sitting in his esophagus. Complains of some generalized gassiness in his abdomen no abdominal pain.  He has occasional constipation managed with milk of magnesia. He is being followed by psychiatry for depression and is currently on Effexor X are 150 mg daily and takes temazepam 15 mg at bedtime for sleep.  He states he does not feel particularly depressed.  His wife had died last summer and he was quite depressed last year.  These current GERD symptoms seem to manifest after his wife had passed away.  Review of Systems Pertinent positive and negative review of systems were noted in the above HPI section.  All other review of  systems was otherwise negative.  Outpatient Encounter Medications as of 08/06/2017  Medication Sig  . DEXILANT 60 MG capsule Take 1 tablet by mouth daily.  . finasteride (PROSCAR) 5 MG tablet Take 5 mg by mouth daily.  Marland Kitchen lactose free nutrition (BOOST) LIQD Take 237 mLs by mouth 2 (two) times daily between meals.  . polyethylene glycol (MIRALAX / GLYCOLAX) packet Take 17 g by mouth 2 (two) times daily.  . sucralfate (CARAFATE) 1 g tablet Take 1 tablet by mouth 4 (four) times daily -  before meals and at bedtime.  . temazepam (RESTORIL) 15 MG capsule Take 30 mg by mouth at bedtime.  Marland Kitchen venlafaxine XR (EFFEXOR-XR) 150 MG 24 hr capsule Take 1 capsule (150 mg total) daily with breakfast by mouth.  . AMBULATORY NON FORMULARY MEDICATION Medication Name: GI Cocktail 90 ml- Lidocaine 2 % 90 ml- Dicyclomine 10 mg/5 ml 270 ml-Maalox Total: 450 ml Take 30 cc's after lunch and dinner and as needed.  . [DISCONTINUED] AMBULATORY NON FORMULARY MEDICATION Medication Name: GI Cocktail 90 ml- Lidocaine 2 % 90 ml- Dicyclomine 10 mg/5 ml 270 Maalox Total: 450 ml Take 30 cc's after lunch and dinner and as needed.  . [DISCONTINUED] aspirin 81 MG tablet Take 81 mg by mouth daily.  . [DISCONTINUED] montelukast (SINGULAIR) 10 MG tablet Take 10 mg by mouth at bedtime.  . [  DISCONTINUED] omeprazole (PRILOSEC) 20 MG capsule Take 1 capsule (20 mg total) 2 (two) times daily before a meal by mouth.  . [DISCONTINUED] omeprazole (PRILOSEC) 40 MG capsule Take 1 tab twice daily before breakfast and before dinner.  . [DISCONTINUED] ranitidine (ZANTAC) 150 MG tablet Take 1 tablet (150 mg total) 2 (two) times daily by mouth.  . [DISCONTINUED] senna (SENOKOT) 8.6 MG TABS tablet Take 1 tablet by mouth at bedtime.   No facility-administered encounter medications on file as of 08/06/2017.    Allergies  Allergen Reactions  . Cymbalta [Duloxetine Hcl]   . Remeron [Mirtazapine]   . Sulfa Antibiotics Hives   Patient Active  Problem List   Diagnosis Date Noted  . Skin lesion 05/03/2017  . Recurrent pneumonia 01/26/2017  . MDD (major depressive disorder), single episode, moderate ([place]) 01/26/2017  . Hyponatremia 01/25/2017  . Other constipation 07/31/2016  . Weight loss 07/03/2016  . Hypersensitivity reaction 06/05/2016  . Goals of care, counseling/discussion 05/24/2016  . Lymphoma, small lymphocytic ([place]) 05/04/2016  . Pancytopenia, acquired ([place]) 05/04/2016  . Gilbert's syndrome 10/29/2015  . Anemia due to chronic illness 10/29/2015  . CLL (chronic lymphocytic leukemia) ([place]) 10/28/2015   Social History   Socioeconomic History  . Marital status: Widowed    Spouse name: [name]  . Number of children: 1  . Years of education: Not on file  . Highest education level: Not on file  Occupational History  . Occupation: retired    Fish farm manager: [employer]    Comment: marketing  Social Needs  . Financial resource strain: Not on file  . Food insecurity:    Worry: Not on file    Inability: Not on file  . Transportation needs:    Medical: Not on file    Non-medical: Not on file  Tobacco Use  . Smoking status: Never Smoker  . Smokeless tobacco: Never Used  Substance and Sexual Activity  . Alcohol use: Yes    Alcohol/week: 1.8 oz    Types: 3 Glasses of wine per week  . Drug use: No  . Sexual activity: Never  Lifestyle  . Physical activity:    Days per week: Not on file    Minutes per session: Not on file  . Stress: Not on file  Relationships  . Social connections:    Talks on phone: Not on file    Gets together: Not on file    Attends religious service: Not on file    Active member of club or organization: Not on file    Attends meetings of clubs or organizations: Not on file    Relationship status: Not on file  . Intimate partner violence:    Fear of current or ex partner: Not on file    Emotionally abused: Not on file    Physically abused: Not on file    Forced sexual activity: Not on file    Other Topics Concern  . Not on file  Social History Narrative   Widowed summer 2018   Friend's home, moved into assisted living summer 2018   Brother is [name]    Christopher Fowler family history includes Breast cancer (age of onset: 42) in his mother; Cancer (age of onset: 26) in his paternal uncle.      Objective:    Vitals:   08/06/17 0941  BP: 128/76  Pulse: 96    Physical Exam; well-developed elderly white male, in no acute distress.  He is here alone today.  Blood pressure 128/76,  pulse 96, height 6 foot, weight 146 (up 2 pounds since last office visit).  HEENT ;nontraumatic normocephalic EOMI PERRLA sclera anicteric, Oropharynx benign.  Cardiovascular ;regular rate and rhythm with S1-S2 no murmur rub or gallop, Pulmonary ;clear bilaterally, Abdomen; soft, nondistended bowel sounds are active there is no palpable mass or hepatosplenomegaly, no tenderness.  Extremities; no clubbing cyanosis or edema skin warm and dry, Neuro psych; alert and oriented, grossly nonfocal mood and affect appropriate however affect flat       Assessment & Plan:   #15 84 year old white male with chronic GERD with primary complaint of fullness and gurgling sensation in the upper esophagus postprandially.  No current complaints of heartburn or indigestion  He is on high-dose PPI with Dexilant 60 mg p.o. daily with persistent symptoms. Also on Carafate tablets 4 times daily which he is having a hard time swallowing.  I doubt he has severe refractory GERD, suspect a component of functional GERD. Rule out dysmotility,  #2 chronic lymphocytic leukemia stage IV #3 depression  Plan; continue Dexilant 60 mg p.o. every morning Stop Carafate tablets Start trial of GI cocktail post lunch and post dinner, and as needed. Schedule for barium swallow with tablet He will continue to follow a strict antireflux regimen which  he has been very careful with. Patient will follow up with Dr. Carlean Purl in 1 month.  I  am also happy to follow him up as needed.     Amy S Esterwood PA-C 08/06/2017   Cc: Crist Infante, MD  Agree with Ms. Genia Harold assessment and plan. Gatha Mayer, MD, [facility]

## 2017-08-06 NOTE — Telephone Encounter (Signed)
I advised the patient that his prescription for the GI Cocktail was not covered by either insurance.  The cost is $46.76.  The patient said that was fine with him and he will have someone take him to [facility] later this afternoon.

## 2017-08-07 ENCOUNTER — Telehealth: Payer: Self-pay | Admitting: Physician Assistant

## 2017-08-07 NOTE — Telephone Encounter (Signed)
The patient asked if he could take his anti-depressent tablet before the test. I told him yes with a few sips of water.  He said he can take the his other medications after the test.  He thanked me for calling him back.

## 2017-08-09 ENCOUNTER — Ambulatory Visit (HOSPITAL_COMMUNITY)
Admission: RE | Admit: 2017-08-09 | Discharge: 2017-08-09 | Disposition: A | Discharge status: Home / Self Care | Payer: Medicare Other | Source: Ambulatory Visit | Attending: Physician Assistant | Admitting: Physician Assistant

## 2017-08-09 DIAGNOSIS — R9389 Abnormal findings on diagnostic imaging of other specified body structures: Secondary | ICD-10-CM | POA: Diagnosis not present

## 2017-08-09 DIAGNOSIS — K219 Gastro-esophageal reflux disease without esophagitis: Secondary | ICD-10-CM | POA: Insufficient documentation

## 2017-08-13 ENCOUNTER — Telehealth: Payer: Self-pay | Admitting: Physician Assistant

## 2017-08-13 NOTE — Telephone Encounter (Signed)
Patient is aware 

## 2017-08-13 NOTE — Telephone Encounter (Signed)
Pt would like  A call back regarding results of barium swallow. He wants to know what Amy's recommendations are due to results.

## 2017-08-16 DIAGNOSIS — R1313 Dysphagia, pharyngeal phase: Secondary | ICD-10-CM | POA: Diagnosis not present

## 2017-08-21 DIAGNOSIS — R1313 Dysphagia, pharyngeal phase: Secondary | ICD-10-CM | POA: Diagnosis not present

## 2017-08-23 DIAGNOSIS — R1313 Dysphagia, pharyngeal phase: Secondary | ICD-10-CM | POA: Diagnosis not present

## 2017-08-28 DIAGNOSIS — R1313 Dysphagia, pharyngeal phase: Secondary | ICD-10-CM | POA: Diagnosis not present

## 2017-08-30 DIAGNOSIS — R1313 Dysphagia, pharyngeal phase: Secondary | ICD-10-CM | POA: Diagnosis not present

## 2017-09-03 DIAGNOSIS — F411 Generalized anxiety disorder: Secondary | ICD-10-CM | POA: Diagnosis not present

## 2017-09-04 DIAGNOSIS — R1313 Dysphagia, pharyngeal phase: Secondary | ICD-10-CM | POA: Diagnosis not present

## 2017-09-05 DIAGNOSIS — E538 Deficiency of other specified B group vitamins: Secondary | ICD-10-CM | POA: Diagnosis not present

## 2017-09-07 DIAGNOSIS — R1313 Dysphagia, pharyngeal phase: Secondary | ICD-10-CM | POA: Diagnosis not present

## 2017-09-10 DIAGNOSIS — R1313 Dysphagia, pharyngeal phase: Secondary | ICD-10-CM | POA: Diagnosis not present

## 2017-09-13 DIAGNOSIS — R1313 Dysphagia, pharyngeal phase: Secondary | ICD-10-CM | POA: Diagnosis not present

## 2017-09-18 DIAGNOSIS — R1313 Dysphagia, pharyngeal phase: Secondary | ICD-10-CM | POA: Diagnosis not present

## 2017-09-20 DIAGNOSIS — R1313 Dysphagia, pharyngeal phase: Secondary | ICD-10-CM | POA: Diagnosis not present

## 2017-09-25 DIAGNOSIS — R1313 Dysphagia, pharyngeal phase: Secondary | ICD-10-CM | POA: Diagnosis not present

## 2017-10-02 ENCOUNTER — Ambulatory Visit: Payer: Self-pay | Admitting: Physician Assistant

## 2017-10-09 DIAGNOSIS — E538 Deficiency of other specified B group vitamins: Secondary | ICD-10-CM | POA: Diagnosis not present

## 2017-10-10 DIAGNOSIS — R3914 Feeling of incomplete bladder emptying: Secondary | ICD-10-CM | POA: Diagnosis not present

## 2017-10-10 DIAGNOSIS — R8279 Other abnormal findings on microbiological examination of urine: Secondary | ICD-10-CM | POA: Diagnosis not present

## 2017-10-16 ENCOUNTER — Encounter

## 2017-10-16 ENCOUNTER — Ambulatory Visit (INDEPENDENT_AMBULATORY_CARE_PROVIDER_SITE_OTHER): Payer: Medicare Other | Admitting: Internal Medicine

## 2017-10-16 ENCOUNTER — Encounter: Payer: Self-pay | Admitting: Internal Medicine

## 2017-10-16 VITALS — BP 110/70 | HR 100 | Ht 72.0 in | Wt 147.0 lb

## 2017-10-16 DIAGNOSIS — K219 Gastro-esophageal reflux disease without esophagitis: Secondary | ICD-10-CM

## 2017-10-16 DIAGNOSIS — R634 Abnormal weight loss: Secondary | ICD-10-CM | POA: Diagnosis not present

## 2017-10-16 DIAGNOSIS — R1313 Dysphagia, pharyngeal phase: Secondary | ICD-10-CM

## 2017-10-16 MED ORDER — ALUM HYDROXIDE-MAG CARBONATE 95-358 MG/15ML PO SUSP: 30.0000 mL | Freq: Three times a day (TID) | ORAL | 0 refills | Status: AC

## 2017-10-16 NOTE — Patient Instructions (Signed)
  Please purchase over the counter Gaviscon and try it after meals and at bedtime.   Talk with Dr Casimiro Needle about trying Remeron. We know its on your allergy list but not sure where that came from.   Call us in several weeks and book a follow up appointment for October or November.    I appreciate the opportunity to care for you. Silvano Rusk, MD, [facility]

## 2017-10-16 NOTE — Progress Notes (Signed)
   Christopher Fowler. 84 y.o. 02-02-1925 [id number]  Assessment & Plan:   Encounter Diagnoses  Name Primary?  . Gastroesophageal reflux disease without esophagitis Yes  . Pharyngeal dysphagia   . Loss of weight     F/u Oct or Nov Gaviscon tid hs ? Remeron???se or all Message to [name] and cc him to see what he thinks re: Remeron  HY:[id number], Elta Guadeloupe, MD Dr. [name]  Subjective:   Chief Complaint:GERD Reflux HPI Patient is here w/ daughter - better appetite overall - weight fairly stable. Has occasional reflux - gets gurgling in back of throat. Dr. Joylene Draft put him on [place] but he is not sure its any better than other PPI. Occasional use of GI cocktail. Does not think sucralfate helps.  Some increased bloating and flatulence. Bending over leads to regurgitation. Appetite still off Drinks Boost/Ensure Does use Tums some  Wt Readings from Last 3 Encounters:  10/16/17 147 lb (66.7 kg)  08/06/17 146 lb 6 oz (66.4 kg)  05/24/17 144 lb (65.3 kg)    Allergies  Allergen Reactions  . Cymbalta [Duloxetine Hcl]   . Remeron [Mirtazapine]   . Sulfa Antibiotics Hives   Current Meds  Medication Sig  . AMBULATORY NON FORMULARY MEDICATION Medication Name: GI Cocktail 90 ml- Lidocaine 2 % 90 ml- Dicyclomine 10 mg/5 ml 270 ml-Maalox Total: 450 ml Take 30 cc's after lunch and dinner and as needed.  Marland Kitchen DEXILANT 60 MG capsule Take 1 tablet by mouth daily.  . finasteride (PROSCAR) 5 MG tablet Take 5 mg by mouth daily.  Marland Kitchen lactose free nutrition (BOOST) LIQD Take 237 mLs by mouth 2 (two) times daily between meals.  . polyethylene glycol (MIRALAX / GLYCOLAX) packet Take 17 g by mouth 2 (two) times daily.  . temazepam (RESTORIL) 30 MG capsule Take 1 capsule by mouth at bedtime.  Marland Kitchen venlafaxine XR (EFFEXOR-XR) 75 MG 24 hr capsule Take 3 capsules by mouth daily.   Past Medical History:  Diagnosis Date  . BPH (benign prostatic hyperplasia)   . CLL (chronic lymphocytic  leukemia) ([place])   . Degenerative joint disease (DJD) of hip    hip  . DJD (degenerative joint disease) of cervical spine   . DJD (degenerative joint disease) of knee    left  . Elevated MCV   . Gilbert's syndrome   . Hernia, inguinal    left  . Neurogenic bladder   . Pancreatic cyst    2cm negative needle aspiration  . Spermatocele    right  . 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 BACK SURGERY     L4 herniated disk  . COLONOSCOPY    . EUS  2007   Social History   Social History Narrative   Widowed summer 2018   Friend's home, moved into assisted living summer 2018   Brother is Michelangelo Rindfleisch   family history includes Breast cancer (age of onset: 47) in his mother; Cancer (age of onset: 56) in his paternal uncle.   Review of Systems As above Less depressed  Objective:   Physical Exam BP 110/70 (BP Location: Left Arm, Patient Position: Sitting, Cuff Size: Normal)   Pulse 100   Ht 6' (1.829 m)   Wt 147 lb (66.7 kg)   BMI 19.94 kg/m  NAD Appropriate mood/affect Alert and oriented  15 minutes time spent with patient > half in counseling coordination of care

## 2017-10-29 ENCOUNTER — Other Ambulatory Visit: Payer: Self-pay | Admitting: Hematology and Oncology

## 2017-10-29 DIAGNOSIS — F321 Major depressive disorder, single episode, moderate: Secondary | ICD-10-CM

## 2017-10-29 DIAGNOSIS — R634 Abnormal weight loss: Secondary | ICD-10-CM

## 2017-10-29 DIAGNOSIS — C9110 Chronic lymphocytic leukemia of B-cell type not having achieved remission: Secondary | ICD-10-CM

## 2017-11-01 ENCOUNTER — Telehealth: Payer: Self-pay | Admitting: Hematology and Oncology

## 2017-11-01 ENCOUNTER — Encounter: Payer: Self-pay | Admitting: Hematology and Oncology

## 2017-11-01 ENCOUNTER — Inpatient Hospital Stay: Payer: Medicare Other | Attending: Hematology and Oncology | Admitting: Hematology and Oncology

## 2017-11-01 ENCOUNTER — Inpatient Hospital Stay: Payer: Medicare Other

## 2017-11-01 DIAGNOSIS — R634 Abnormal weight loss: Secondary | ICD-10-CM

## 2017-11-01 DIAGNOSIS — R1313 Dysphagia, pharyngeal phase: Secondary | ICD-10-CM | POA: Diagnosis not present

## 2017-11-01 DIAGNOSIS — F321 Major depressive disorder, single episode, moderate: Secondary | ICD-10-CM

## 2017-11-01 DIAGNOSIS — C9111 Chronic lymphocytic leukemia of B-cell type in remission: Secondary | ICD-10-CM | POA: Insufficient documentation

## 2017-11-01 DIAGNOSIS — C9110 Chronic lymphocytic leukemia of B-cell type not having achieved remission: Secondary | ICD-10-CM

## 2017-11-01 DIAGNOSIS — K219 Gastro-esophageal reflux disease without esophagitis: Secondary | ICD-10-CM | POA: Diagnosis not present

## 2017-11-01 DIAGNOSIS — K5901 Slow transit constipation: Secondary | ICD-10-CM | POA: Diagnosis not present

## 2017-11-01 LAB — COMPREHENSIVE METABOLIC PANEL
ALBUMIN: 3.9 g/dL (ref 3.5–5.0)
ALK PHOS: 83 U/L (ref 38–126)
ALT: 17 U/L (ref 0–44)
ANION GAP: 8 (ref 5–15)
AST: 25 U/L (ref 15–41)
BUN: 21 mg/dL (ref 8–23)
CALCIUM: 8.6 mg/dL — AB (ref 8.9–10.3)
CO2: 31 mmol/L (ref 22–32)
Chloride: 98 mmol/L (ref 98–111)
Creatinine, Ser: 1.13 mg/dL (ref 0.61–1.24)
GFR calc non Af Amer: 55 mL/min — ABNORMAL LOW (ref >60)
GLUCOSE: 74 mg/dL (ref 70–99)
POTASSIUM: 4.1 mmol/L (ref 3.5–5.1)
SODIUM: 137 mmol/L (ref 135–145)
Total Bilirubin: 0.9 mg/dL (ref 0.3–1.2)
Total Protein: 6.9 g/dL (ref 6.5–8.1)

## 2017-11-01 LAB — CBC WITH DIFFERENTIAL/PLATELET
BASOS PCT: 1 %
Basophils Absolute: 0.1 10*3/uL (ref 0.0–0.1)
EOS ABS: 0.1 10*3/uL (ref 0.0–0.5)
EOS PCT: 2 %
HCT: 37.9 % — ABNORMAL LOW (ref 38.4–49.9)
HEMOGLOBIN: 12.5 g/dL — AB (ref 13.0–17.1)
Lymphocytes Relative: 33 %
Lymphs Abs: 2.6 10*3/uL (ref 0.9–3.3)
MCH: 30.5 pg (ref 27.2–33.4)
MCHC: 33 g/dL (ref 32.0–36.0)
MCV: 92.4 fL (ref 79.3–98.0)
MONOS PCT: 10 %
Monocytes Absolute: 0.8 10*3/uL (ref 0.1–0.9)
NEUTROS PCT: 54 %
Neutro Abs: 4.3 10*3/uL (ref 1.5–6.5)
Platelets: 233 10*3/uL (ref 140–400)
RBC: 4.1 MIL/uL — ABNORMAL LOW (ref 4.20–5.82)
RDW: 13.6 % (ref 11.0–14.6)
WBC: 7.8 10*3/uL (ref 4.0–10.3)

## 2017-11-01 LAB — TSH: TSH: 1.343 u[IU]/mL (ref 0.320–4.118)

## 2017-11-01 NOTE — Telephone Encounter (Signed)
Gave patient avs and calendar of upcoming appts.  °

## 2017-11-02 ENCOUNTER — Encounter: Payer: Self-pay | Admitting: Hematology and Oncology

## 2017-11-02 NOTE — Assessment & Plan Note (Signed)
His weight loss has stabilized Recent TSH is within normal limits

## 2017-11-02 NOTE — Assessment & Plan Note (Addendum)
He has no clinical signs of disease relapse I plan to see him back in 12 months with repeat history, physical examination and blood work. We discussed the importance of annual influenza vaccination

## 2017-11-02 NOTE — Progress Notes (Signed)
[place] OFFICE PROGRESS NOTE  Patient Care Team: Crist Infante, MD as PCP - General (Internal Medicine)  ASSESSMENT & PLAN:  CLL (chronic lymphocytic leukemia) ([place]) He has no clinical signs of disease relapse I plan to see him back in 12 months with repeat history, physical examination and blood work. We discussed the importance of annual influenza vaccination  Weight loss His weight loss has stabilized Recent TSH is within normal limits   No orders of the defined types were placed in this encounter.   INTERVAL HISTORY: Please see below for problem oriented charting. He returns for further follow-up Denies recent infection No new lymphadenopathy His weight is stable  SUMMARY OF ONCOLOGIC HISTORY:   CLL (chronic lymphocytic leukemia) ([place])   10/28/2015 Pathology Results    Flow cytometry confirmed CLL    10/28/2015 Tumor Marker    FISH panel confirmed del 13 q    05/24/2016 PET scan    There is a mildly prominent AP window lymph node and numerous small axillary lymph nodes, but these are not hypermetabolic beyond the mediastinal blood pool activity. 2. Mild splenomegaly, without abnormal splenic activity. 3. Other imaging findings of potential clinical significance: Chronic left frontal and right maxillary sinusitis. Coronary, aortic arch, and branch vessel atherosclerotic vascular disease. Aortoiliac atherosclerotic vascular disease. Mildly prominent prostate gland. Right scrotal hydrocele. Spondylosis, with postoperative findings in the lumbar spine. Degenerative arthropathy of both hips.    06/05/2016 - 10/23/2016 Chemotherapy    He was started on Gazyva    08/25/2016 PET scan    1. The AP window lymph node is slightly smaller at 1.1 cm, and remains of similar activity to background blood pool. 2. Prior splenomegaly has resolved. Normal splenic activity. 3. No current active hypermetabolic activity to suggest active malignancy. 4. Other imaging findings of  potential clinical significance: Aortic Atherosclerosis (ICD10-I70.0). Coronary atherosclerosis. Prominent stool throughout the colon favors constipation. Prostatomegaly. Right scrotal hydrocele. Mild chronic left frontal sinusitis. Biapical pleuroparenchymal scarring.    03/14/2017 Imaging    1. Overall improvement in right lower lobe consolidation consistent with improving pneumonia. Again, the associated high density supports aspiration as the etiology, and there is a new cavitary component which may reflect a small abscess. Bilateral pleural effusions have improved. 2. No new airspace disease. 3. No adenopathy. 4. 4.4 cm ascending aortic aneurysm.      REVIEW OF SYSTEMS:   Constitutional: Denies fevers, chills or abnormal weight loss Eyes: Denies blurriness of vision Ears, nose, mouth, throat, and face: Denies mucositis or sore throat Respiratory: Denies cough, dyspnea or wheezes Cardiovascular: Denies palpitation, chest discomfort or lower extremity swelling Gastrointestinal:  Denies nausea, heartburn or change in bowel habits Skin: Denies abnormal skin rashes Lymphatics: Denies new lymphadenopathy or easy bruising Neurological:Denies numbness, tingling or new weaknesses Behavioral/Psych: Mood is stable, no new changes  All other systems were reviewed with the patient and are negative.  I have reviewed the past medical history, past surgical history, social history and family history with the patient and they are unchanged from previous note.  ALLERGIES:  is allergic to cymbalta [duloxetine hcl]; remeron [mirtazapine]; and sulfa antibiotics.  MEDICATIONS:  Current Outpatient Medications  Medication Sig Dispense Refill  . aluminum hydroxide-magnesium carbonate (GAVISCON) 95-358 MG/15ML SUSP Take 30 mLs by mouth 4 (four) times daily - after meals and at bedtime.  0  . AMBULATORY NON FORMULARY MEDICATION Medication Name: GI Cocktail 90 ml- Lidocaine 2 % 90 ml- Dicyclomine 10 mg/5  ml 270 ml-Maalox  Total: 450 ml Take 30 cc's after lunch and dinner and as needed. 450 mL 6  . DEXILANT 60 MG capsule Take 1 tablet by mouth daily.    . finasteride (PROSCAR) 5 MG tablet Take 5 mg by mouth daily.    Marland Kitchen lactose free nutrition (BOOST) LIQD Take 237 mLs by mouth 2 (two) times daily between meals.    . polyethylene glycol (MIRALAX / GLYCOLAX) packet Take 17 g by mouth 2 (two) times daily.    . temazepam (RESTORIL) 30 MG capsule Take 1 capsule by mouth at bedtime.    Marland Kitchen venlafaxine XR (EFFEXOR-XR) 75 MG 24 hr capsule Take 3 capsules by mouth daily.     No current facility-administered medications for this visit.     PHYSICAL EXAMINATION: ECOG PERFORMANCE STATUS: 0 - Asymptomatic  Vitals:   11/01/17 1449  BP: (!) 144/72  Pulse: 92  Resp: 17  Temp: 98.2 F (36.8 C)  SpO2: 100%   Filed Weights   11/01/17 1449  Weight: 148 lb 3.2 oz (67.2 kg)    GENERAL:alert, no distress and comfortable SKIN: skin color, texture, turgor are normal, no rashes or significant lesions EYES: normal, Conjunctiva are pink and non-injected, sclera clear OROPHARYNX:no exudate, no erythema and lips, buccal mucosa, and tongue normal  NECK: supple, thyroid normal size, non-tender, without nodularity LYMPH:  no palpable lymphadenopathy in the cervical, axillary or inguinal LUNGS: clear to auscultation and percussion with normal breathing effort HEART: regular rate & rhythm and no murmurs and no lower extremity edema ABDOMEN:abdomen soft, non-tender and normal bowel sounds Musculoskeletal:no cyanosis of digits and no clubbing  NEURO: alert & oriented x 3 with fluent speech, no focal motor/sensory deficits  LABORATORY DATA:  I have reviewed the data as listed    Component Value Date/Time   NA 137 11/01/2017 1430   NA 136 10/19/2016 1233   K 4.1 11/01/2017 1430   K 4.1 10/19/2016 1233   CL 98 11/01/2017 1430   CO2 31 11/01/2017 1430   CO2 28 10/19/2016 1233   GLUCOSE 74 11/01/2017 1430    GLUCOSE 93 10/19/2016 1233   BUN 21 11/01/2017 1430   BUN 18.4 10/19/2016 1233   CREATININE 1.13 11/01/2017 1430   CREATININE 1.1 10/19/2016 1233   CALCIUM 8.6 (L) 11/01/2017 1430   CALCIUM 8.8 10/19/2016 1233   PROT 6.9 11/01/2017 1430   PROT 6.7 10/19/2016 1233   ALBUMIN 3.9 11/01/2017 1430   ALBUMIN 3.9 10/19/2016 1233   AST 25 11/01/2017 1430   AST 30 10/19/2016 1233   ALT 17 11/01/2017 1430   ALT 15 10/19/2016 1233   ALKPHOS 83 11/01/2017 1430   ALKPHOS 57 10/19/2016 1233   BILITOT 0.9 11/01/2017 1430   BILITOT 2.05 (H) 10/19/2016 1233   GFRNONAA 55 (L) 11/01/2017 1430   GFRAA >60 11/01/2017 1430    No results found for: SPEP, UPEP  Lab Results  Component Value Date   WBC 7.8 11/01/2017   NEUTROABS 4.3 11/01/2017   HGB 12.5 (L) 11/01/2017   HCT 37.9 (L) 11/01/2017   MCV 92.4 11/01/2017   PLT 233 11/01/2017      Chemistry      Component Value Date/Time   NA 137 11/01/2017 1430   NA 136 10/19/2016 1233   K 4.1 11/01/2017 1430   K 4.1 10/19/2016 1233   CL 98 11/01/2017 1430   CO2 31 11/01/2017 1430   CO2 28 10/19/2016 1233   BUN 21 11/01/2017 1430   BUN 18.4  10/19/2016 1233   CREATININE 1.13 11/01/2017 1430   CREATININE 1.1 10/19/2016 1233      Component Value Date/Time   CALCIUM 8.6 (L) 11/01/2017 1430   CALCIUM 8.8 10/19/2016 1233   ALKPHOS 83 11/01/2017 1430   ALKPHOS 57 10/19/2016 1233   AST 25 11/01/2017 1430   AST 30 10/19/2016 1233   ALT 17 11/01/2017 1430   ALT 15 10/19/2016 1233   BILITOT 0.9 11/01/2017 1430   BILITOT 2.05 (H) 10/19/2016 1233      All questions were answered. The patient knows to call the clinic with any problems, questions or concerns. No barriers to learning was detected.  I spent 10 minutes counseling the patient face to face. The total time spent in the appointment was 15 minutes and more than 50% was on counseling and review of test results  [name], MD 11/02/2017 7:52 AM

## 2017-11-14 DIAGNOSIS — E538 Deficiency of other specified B group vitamins: Secondary | ICD-10-CM | POA: Diagnosis not present

## 2017-11-21 DIAGNOSIS — H2513 Age-related nuclear cataract, bilateral: Secondary | ICD-10-CM | POA: Diagnosis not present

## 2017-11-23 ENCOUNTER — Telehealth: Payer: Self-pay | Admitting: *Deleted

## 2017-11-23 NOTE — Telephone Encounter (Signed)
Pt has PCP appt in 1 week. Wants to know if it is OK for him to take shingles vaccine.

## 2017-11-23 NOTE — Telephone Encounter (Signed)
The only type allowed is Shingrix 

## 2017-11-27 ENCOUNTER — Telehealth: Payer: Self-pay | Admitting: *Deleted

## 2017-11-27 NOTE — Telephone Encounter (Signed)
Notified he can take Shingrix. Verbalized understanding

## 2017-11-29 DIAGNOSIS — R3914 Feeling of incomplete bladder emptying: Secondary | ICD-10-CM | POA: Diagnosis not present

## 2017-11-30 DIAGNOSIS — H9313 Tinnitus, bilateral: Secondary | ICD-10-CM | POA: Diagnosis not present

## 2017-11-30 DIAGNOSIS — Z681 Body mass index (BMI) 19 or less, adult: Secondary | ICD-10-CM | POA: Diagnosis not present

## 2017-11-30 DIAGNOSIS — E538 Deficiency of other specified B group vitamins: Secondary | ICD-10-CM | POA: Diagnosis not present

## 2017-11-30 DIAGNOSIS — C9100 Acute lymphoblastic leukemia not having achieved remission: Secondary | ICD-10-CM | POA: Diagnosis not present

## 2017-11-30 DIAGNOSIS — J3089 Other allergic rhinitis: Secondary | ICD-10-CM | POA: Diagnosis not present

## 2017-11-30 DIAGNOSIS — Z23 Encounter for immunization: Secondary | ICD-10-CM | POA: Diagnosis not present

## 2017-11-30 DIAGNOSIS — K219 Gastro-esophageal reflux disease without esophagitis: Secondary | ICD-10-CM | POA: Diagnosis not present

## 2017-11-30 DIAGNOSIS — K5909 Other constipation: Secondary | ICD-10-CM | POA: Diagnosis not present

## 2017-11-30 DIAGNOSIS — F4320 Adjustment disorder, unspecified: Secondary | ICD-10-CM | POA: Diagnosis not present

## 2017-12-13 DIAGNOSIS — L570 Actinic keratosis: Secondary | ICD-10-CM | POA: Diagnosis not present

## 2017-12-13 DIAGNOSIS — C44629 Squamous cell carcinoma of skin of left upper limb, including shoulder: Secondary | ICD-10-CM | POA: Diagnosis not present

## 2017-12-13 DIAGNOSIS — Z85828 Personal history of other malignant neoplasm of skin: Secondary | ICD-10-CM | POA: Diagnosis not present

## 2017-12-13 DIAGNOSIS — L821 Other seborrheic keratosis: Secondary | ICD-10-CM | POA: Diagnosis not present

## 2017-12-19 DIAGNOSIS — E538 Deficiency of other specified B group vitamins: Secondary | ICD-10-CM | POA: Diagnosis not present

## 2017-12-25 DIAGNOSIS — R300 Dysuria: Secondary | ICD-10-CM | POA: Diagnosis not present

## 2017-12-25 DIAGNOSIS — R3914 Feeling of incomplete bladder emptying: Secondary | ICD-10-CM | POA: Diagnosis not present

## 2017-12-25 DIAGNOSIS — R8271 Bacteriuria: Secondary | ICD-10-CM | POA: Diagnosis not present

## 2018-01-01 DIAGNOSIS — K219 Gastro-esophageal reflux disease without esophagitis: Secondary | ICD-10-CM | POA: Diagnosis not present

## 2018-01-01 DIAGNOSIS — K5901 Slow transit constipation: Secondary | ICD-10-CM | POA: Diagnosis not present

## 2018-01-01 DIAGNOSIS — R1313 Dysphagia, pharyngeal phase: Secondary | ICD-10-CM | POA: Diagnosis not present

## 2018-01-10 DIAGNOSIS — F411 Generalized anxiety disorder: Secondary | ICD-10-CM | POA: Diagnosis not present

## 2018-01-16 DIAGNOSIS — E538 Deficiency of other specified B group vitamins: Secondary | ICD-10-CM | POA: Diagnosis not present

## 2018-02-14 DIAGNOSIS — E538 Deficiency of other specified B group vitamins: Secondary | ICD-10-CM | POA: Diagnosis not present

## 2018-02-25 DIAGNOSIS — R12 Heartburn: Secondary | ICD-10-CM | POA: Diagnosis not present

## 2018-02-25 DIAGNOSIS — K219 Gastro-esophageal reflux disease without esophagitis: Secondary | ICD-10-CM | POA: Diagnosis not present

## 2018-05-13 DIAGNOSIS — F329 Major depressive disorder, single episode, unspecified: Secondary | ICD-10-CM | POA: Diagnosis not present

## 2018-05-13 DIAGNOSIS — E786 Lipoprotein deficiency: Secondary | ICD-10-CM | POA: Diagnosis not present

## 2018-05-13 DIAGNOSIS — K5900 Constipation, unspecified: Secondary | ICD-10-CM | POA: Diagnosis not present

## 2018-05-13 DIAGNOSIS — J189 Pneumonia, unspecified organism: Secondary | ICD-10-CM | POA: Diagnosis not present

## 2018-05-13 DIAGNOSIS — D510 Vitamin B12 deficiency anemia due to intrinsic factor deficiency: Secondary | ICD-10-CM | POA: Diagnosis not present

## 2018-05-13 DIAGNOSIS — C9110 Chronic lymphocytic leukemia of B-cell type not having achieved remission: Secondary | ICD-10-CM | POA: Diagnosis not present

## 2018-05-13 DIAGNOSIS — R0989 Other specified symptoms and signs involving the circulatory and respiratory systems: Secondary | ICD-10-CM | POA: Diagnosis not present

## 2018-05-13 DIAGNOSIS — F5101 Primary insomnia: Secondary | ICD-10-CM | POA: Diagnosis not present

## 2018-05-13 DIAGNOSIS — N319 Neuromuscular dysfunction of bladder, unspecified: Secondary | ICD-10-CM | POA: Diagnosis not present

## 2018-05-13 DIAGNOSIS — K219 Gastro-esophageal reflux disease without esophagitis: Secondary | ICD-10-CM | POA: Diagnosis not present

## 2018-05-13 DIAGNOSIS — N400 Enlarged prostate without lower urinary tract symptoms: Secondary | ICD-10-CM | POA: Diagnosis not present

## 2018-05-13 DIAGNOSIS — Z13220 Encounter for screening for lipoid disorders: Secondary | ICD-10-CM | POA: Diagnosis not present

## 2018-05-13 DIAGNOSIS — R05 Cough: Secondary | ICD-10-CM | POA: Diagnosis not present

## 2018-05-13 DIAGNOSIS — R918 Other nonspecific abnormal finding of lung field: Secondary | ICD-10-CM | POA: Diagnosis not present

## 2018-05-22 DIAGNOSIS — R05 Cough: Secondary | ICD-10-CM | POA: Diagnosis not present

## 2018-05-22 DIAGNOSIS — K5900 Constipation, unspecified: Secondary | ICD-10-CM | POA: Diagnosis not present

## 2018-05-22 DIAGNOSIS — D510 Vitamin B12 deficiency anemia due to intrinsic factor deficiency: Secondary | ICD-10-CM | POA: Diagnosis not present

## 2018-05-22 DIAGNOSIS — N400 Enlarged prostate without lower urinary tract symptoms: Secondary | ICD-10-CM | POA: Diagnosis not present

## 2018-05-22 DIAGNOSIS — F5101 Primary insomnia: Secondary | ICD-10-CM | POA: Diagnosis not present

## 2018-05-22 DIAGNOSIS — K219 Gastro-esophageal reflux disease without esophagitis: Secondary | ICD-10-CM | POA: Diagnosis not present

## 2018-05-22 DIAGNOSIS — C9110 Chronic lymphocytic leukemia of B-cell type not having achieved remission: Secondary | ICD-10-CM | POA: Diagnosis not present

## 2018-05-22 DIAGNOSIS — R0989 Other specified symptoms and signs involving the circulatory and respiratory systems: Secondary | ICD-10-CM | POA: Diagnosis not present

## 2018-05-22 DIAGNOSIS — N319 Neuromuscular dysfunction of bladder, unspecified: Secondary | ICD-10-CM | POA: Diagnosis not present

## 2018-05-22 DIAGNOSIS — Z13220 Encounter for screening for lipoid disorders: Secondary | ICD-10-CM | POA: Diagnosis not present

## 2018-05-22 DIAGNOSIS — F329 Major depressive disorder, single episode, unspecified: Secondary | ICD-10-CM | POA: Diagnosis not present

## 2018-05-22 DIAGNOSIS — J189 Pneumonia, unspecified organism: Secondary | ICD-10-CM | POA: Diagnosis not present

## 2018-05-27 DIAGNOSIS — K2950 Unspecified chronic gastritis without bleeding: Secondary | ICD-10-CM | POA: Diagnosis not present

## 2018-05-27 DIAGNOSIS — R1310 Dysphagia, unspecified: Secondary | ICD-10-CM | POA: Diagnosis not present

## 2018-05-27 DIAGNOSIS — K2970 Gastritis, unspecified, without bleeding: Secondary | ICD-10-CM | POA: Diagnosis not present

## 2018-05-27 DIAGNOSIS — K219 Gastro-esophageal reflux disease without esophagitis: Secondary | ICD-10-CM | POA: Diagnosis not present

## 2018-05-27 DIAGNOSIS — F418 Other specified anxiety disorders: Secondary | ICD-10-CM | POA: Diagnosis not present

## 2018-05-27 DIAGNOSIS — K222 Esophageal obstruction: Secondary | ICD-10-CM | POA: Diagnosis not present

## 2018-05-30 DIAGNOSIS — K2950 Unspecified chronic gastritis without bleeding: Secondary | ICD-10-CM | POA: Diagnosis not present

## 2018-06-18 DIAGNOSIS — N390 Urinary tract infection, site not specified: Secondary | ICD-10-CM | POA: Diagnosis not present

## 2018-06-18 DIAGNOSIS — R300 Dysuria: Secondary | ICD-10-CM | POA: Diagnosis not present

## 2018-06-18 DIAGNOSIS — D510 Vitamin B12 deficiency anemia due to intrinsic factor deficiency: Secondary | ICD-10-CM | POA: Diagnosis not present

## 2018-07-02 DIAGNOSIS — F411 Generalized anxiety disorder: Secondary | ICD-10-CM | POA: Diagnosis not present

## 2018-07-03 IMAGING — CT NM PET TUM IMG INITIAL (PI) SKULL BASE T - THIGH
8 series · 25 of 25 positions shown · non-contrast
Comparison: Cervical and thoracic MRI dated 05/11/2013

CLINICAL DATA: Initial treatment strategy for small lymphocytic
lymphoma.

EXAM:
NUCLEAR MEDICINE PET SKULL BASE TO THIGH
TECHNIQUE: 7.9 mCi F-18 FDG was injected intravenously. Full-ring PET imaging
was performed from the skull base to thigh after the radiotracer. CT
data was obtained and used for attenuation correction and anatomic
localization.
FASTING BLOOD GLUCOSE:  Value: 107 mg/dl

[Series 3: pet sk_thigh ac · axial · 5.0mm · 4.07mm/px · z∈[-995,-71]mm · 5 of 232 slices shown]
[im 1/232]
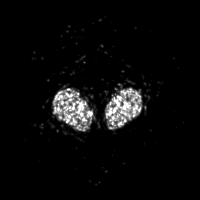
[im 58/232]
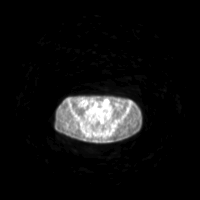
[im 116/232]
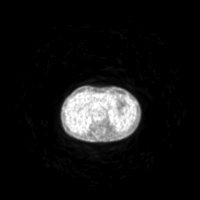
[im 174/232]
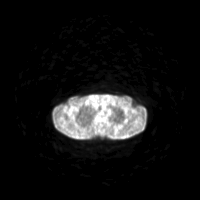
[im 232/232]
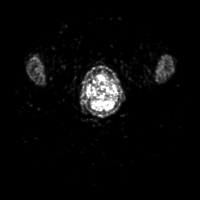

[Series 4: ct sk_thigh 5.0 b31f · axial · 5.0mm · 0.92mm/px · z∈[-995,-71]mm · 5 of 232 slices shown]
[im 1/232]
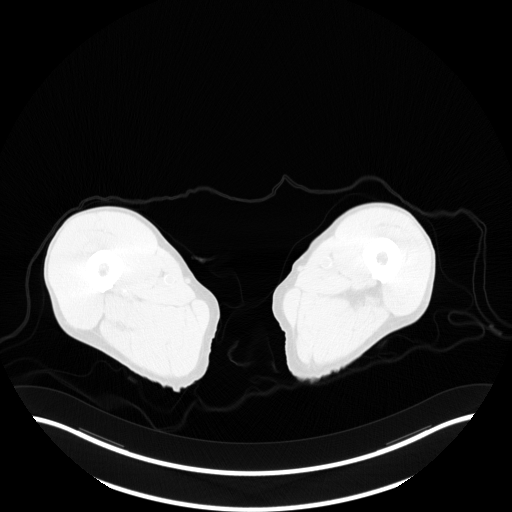
[im 58/232]
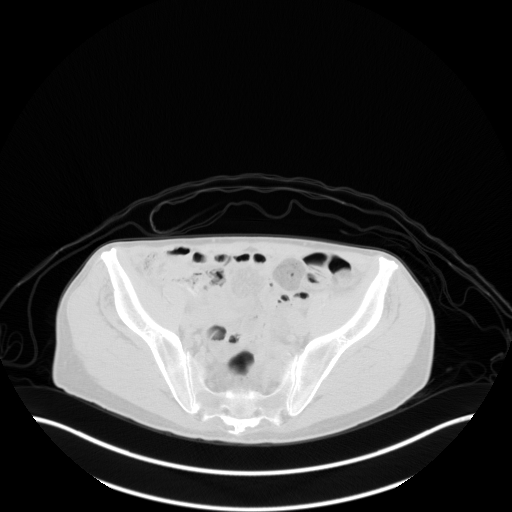
[im 116/232]
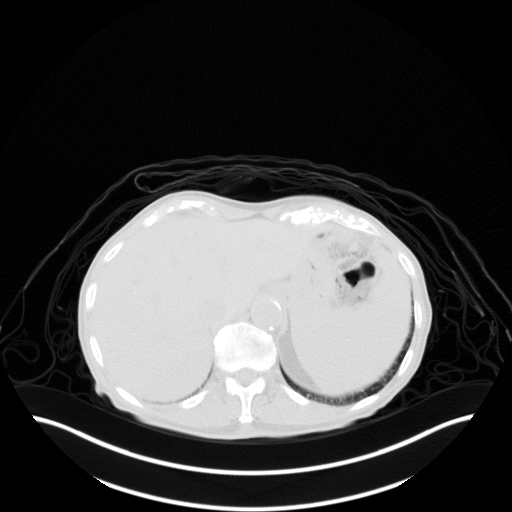
[im 174/232]
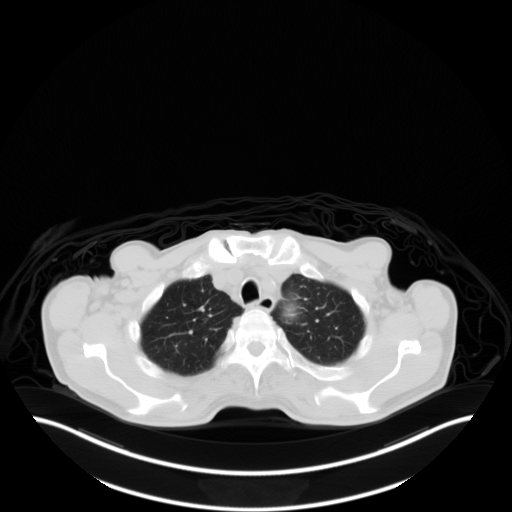
[im 232/232  brain]
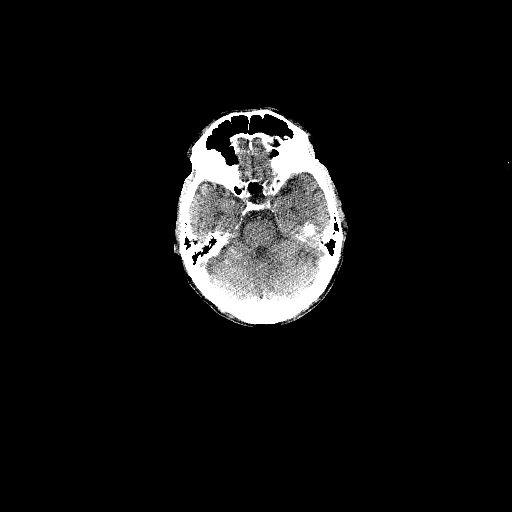

[Series 7: ct sk_thigh 5.0 b70f lung_bone · axial · 5.0mm · 0.72mm/px · z∈[-571,-251]mm · 2 of 81 slices shown]
[im 1/81  bone]
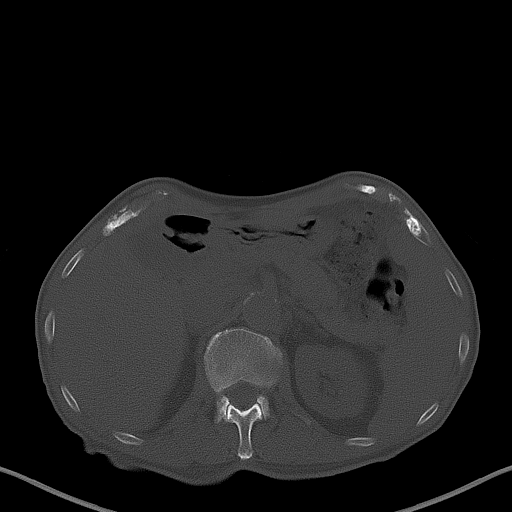
[im 81/81  bone]
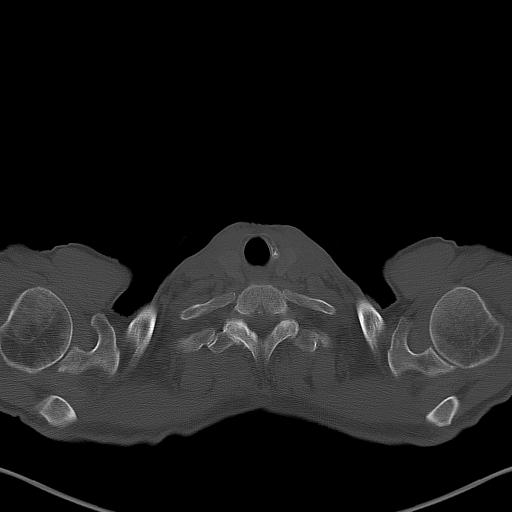

[Series 8: pet sk_thigh nac · axial · 5.0mm · 4.07mm/px · z∈[-995,-71]mm · 5 of 232 slices shown]
[im 1/232]
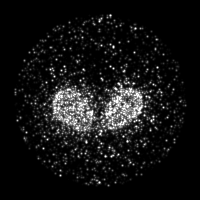
[im 58/232]
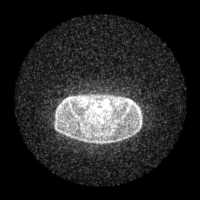
[im 116/232]
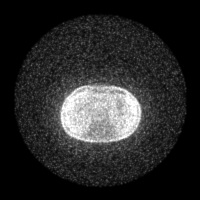
[im 174/232]
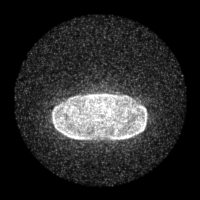
[im 232/232]
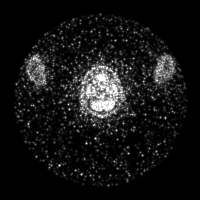

[Series 604: range-ct sk_thigh 5.0 (id)<alpha range> · 1 of 62 slices shown (1 of 2)]
[im 1/62]
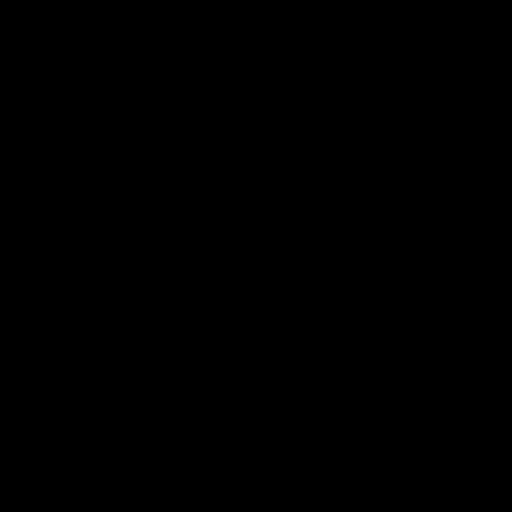

[Series 605: mip collection · coronal · 1.92mm/px · 1 of 32 slices shown]
[im 1/32]
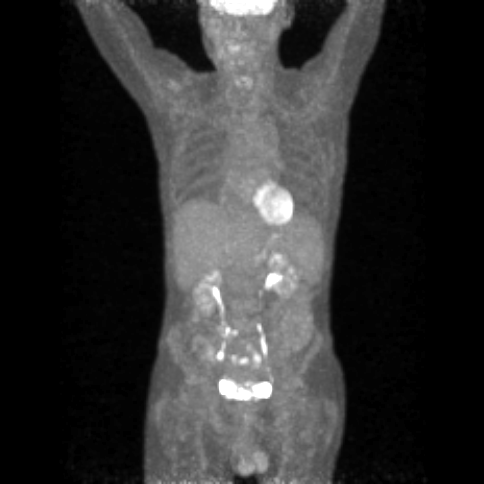

[Series 606: range-ct sk_thigh 5.0 (id)<alpha range> · 5 of 208 slices shown (2 of 2)]
[im 1/208]
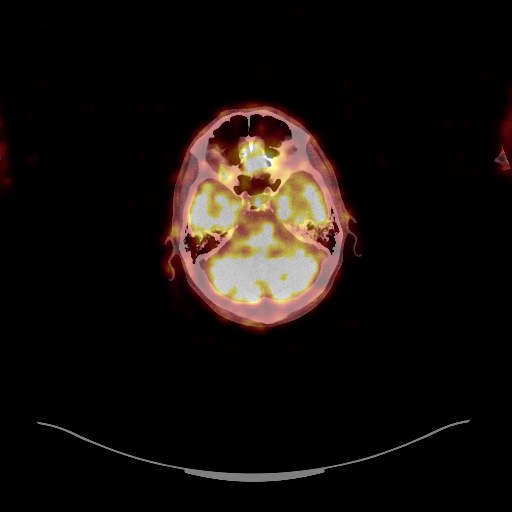
[im 52/208]
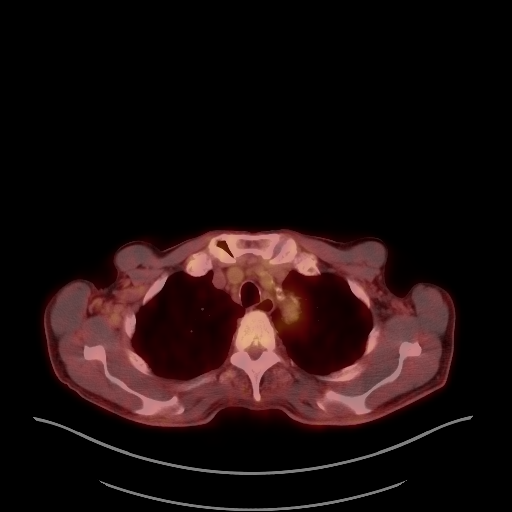
[im 104/208]
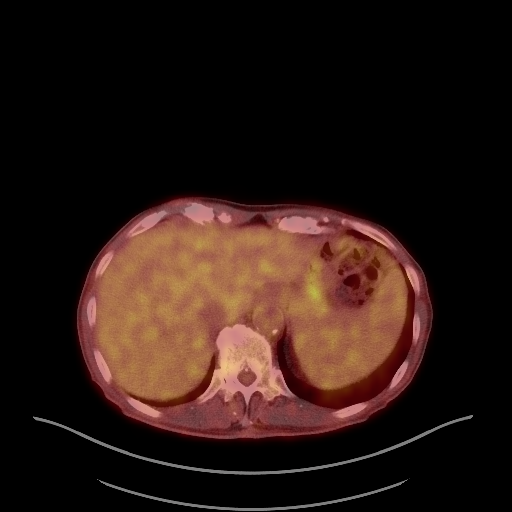
[im 156/208]
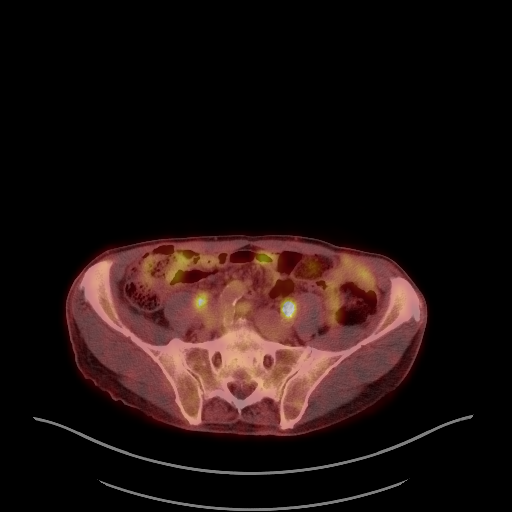
[im 208/208]
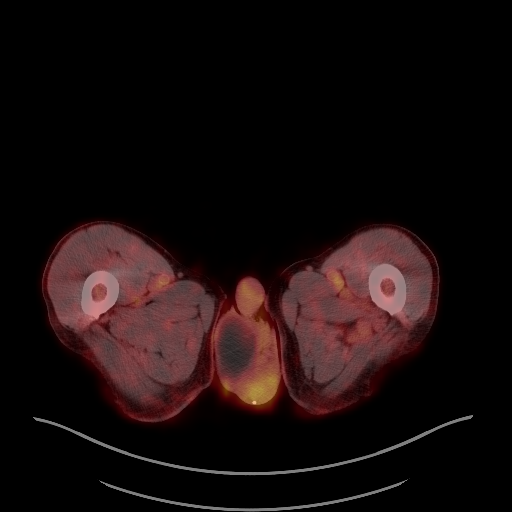

[Series 1032: results mm oncology reading · 5.0mm · 0.69mm/px · 1 of 2 slices shown]
[im 1/2]
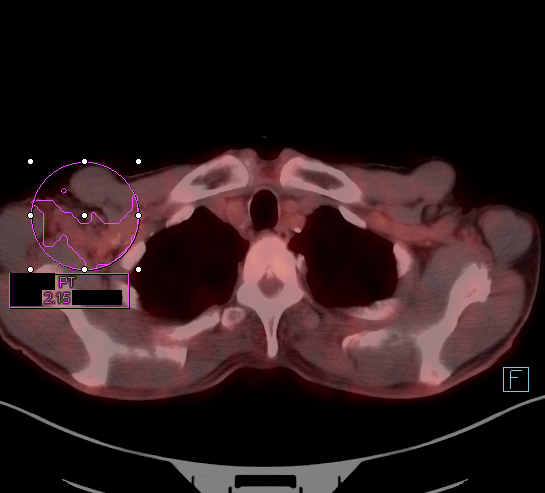

[25 of 25 positions shown; findings below may reference images not displayed]

FINDINGS: NECK

No hypermetabolic lymph nodes in the neck.

Polypoid mucoperiosteal thickening in the left frontal sinus.
Chronic right maxillary sinusitis. Mild bilateral carotid bulb
atherosclerotic calcification. Torus palatinus.

CHEST

There are numerous small axillary lymph nodes, particularly on the
right side, most of which have fatty hila. The right axillary lymph
nodes have maximum SUV of 2.2. Background mediastinal blood pool
activity at 2.4.

AP window lymph node 1.3 cm in short axis, maximum SUV

Coronary, aortic arch, and branch vessel atherosclerotic vascular
disease. Biapical pleuroparenchymal scarring. Mild dependent
subsegmental atelectasis in both lungs. No worrisome nodules
identified.

ABDOMEN/PELVIS

No abnormal hypermetabolic activity within the liver, pancreas,
adrenal glands, or spleen. No hypermetabolic lymph nodes in the
abdomen or pelvis.

Physiologic activity in bowel and in the ureters. Aortoiliac
atherosclerotic vascular disease. Mildly prominent prostate gland
indents the bladder base. Right scrotal hydrocele.

Background liver activity SUV 2.9. Mild splenomegaly, splenic volume
580 cubic cm, no abnormal splenic activity currently.

SKELETON

No focal hypermetabolic activity to suggest skeletal metastasis.

Spondylosis noted. Degenerative right sternoclavicular arthropathy.
Postoperative findings in the lumbar spine. Lower lumbar
degenerative facet arthropathy. Degenerative arthropathy of both
hips.
IMPRESSION: 1. There is a mildly prominent AP window lymph node and numerous
small axillary lymph nodes, but these are not hypermetabolic beyond
the mediastinal blood pool activity.
2. Mild splenomegaly, without abnormal splenic activity.
3. Other imaging findings of potential clinical significance:
Chronic left frontal and right maxillary sinusitis. Coronary, aortic
arch, and branch vessel atherosclerotic vascular disease. Aortoiliac
atherosclerotic vascular disease. Mildly prominent prostate gland.
Right scrotal hydrocele. Spondylosis, with postoperative findings in
the lumbar spine. Degenerative arthropathy of both hips.

## 2018-07-17 DIAGNOSIS — D510 Vitamin B12 deficiency anemia due to intrinsic factor deficiency: Secondary | ICD-10-CM | POA: Diagnosis not present

## 2018-07-30 DIAGNOSIS — K219 Gastro-esophageal reflux disease without esophagitis: Secondary | ICD-10-CM | POA: Diagnosis not present

## 2018-07-30 DIAGNOSIS — R12 Heartburn: Secondary | ICD-10-CM | POA: Diagnosis not present

## 2018-08-14 DIAGNOSIS — D510 Vitamin B12 deficiency anemia due to intrinsic factor deficiency: Secondary | ICD-10-CM | POA: Diagnosis not present

## 2018-09-18 DIAGNOSIS — D510 Vitamin B12 deficiency anemia due to intrinsic factor deficiency: Secondary | ICD-10-CM | POA: Diagnosis not present

## 2018-10-09 DIAGNOSIS — R300 Dysuria: Secondary | ICD-10-CM | POA: Diagnosis not present

## 2018-10-16 DIAGNOSIS — D510 Vitamin B12 deficiency anemia due to intrinsic factor deficiency: Secondary | ICD-10-CM | POA: Diagnosis not present

## 2018-10-18 DIAGNOSIS — R339 Retention of urine, unspecified: Secondary | ICD-10-CM | POA: Diagnosis not present

## 2018-10-18 DIAGNOSIS — N390 Urinary tract infection, site not specified: Secondary | ICD-10-CM | POA: Diagnosis not present

## 2018-10-22 ENCOUNTER — Telehealth: Payer: Self-pay | Admitting: *Deleted

## 2018-10-22 NOTE — Telephone Encounter (Signed)
Received letter from patient (Sent to Scan) patient has moved to [facility] with his daughter. He has established care with Dr. Hinda Kehr [facility] in [place]. He will have that office contact us with release of information if needed. Copy sent to scan.

## 2018-10-31 ENCOUNTER — Ambulatory Visit: Payer: Self-pay | Admitting: Hematology and Oncology

## 2018-10-31 ENCOUNTER — Other Ambulatory Visit: Payer: Self-pay

## 2018-11-05 DIAGNOSIS — F411 Generalized anxiety disorder: Secondary | ICD-10-CM | POA: Diagnosis not present

## 2018-11-06 DIAGNOSIS — C9110 Chronic lymphocytic leukemia of B-cell type not having achieved remission: Secondary | ICD-10-CM | POA: Diagnosis not present

## 2018-11-06 DIAGNOSIS — G4700 Insomnia, unspecified: Secondary | ICD-10-CM | POA: Diagnosis not present

## 2018-11-06 DIAGNOSIS — F329 Major depressive disorder, single episode, unspecified: Secondary | ICD-10-CM | POA: Diagnosis not present

## 2018-11-06 DIAGNOSIS — K5900 Constipation, unspecified: Secondary | ICD-10-CM | POA: Diagnosis not present

## 2018-11-06 DIAGNOSIS — K219 Gastro-esophageal reflux disease without esophagitis: Secondary | ICD-10-CM | POA: Diagnosis not present

## 2018-11-06 DIAGNOSIS — D510 Vitamin B12 deficiency anemia due to intrinsic factor deficiency: Secondary | ICD-10-CM | POA: Diagnosis not present

## 2018-11-27 DIAGNOSIS — Z136 Encounter for screening for cardiovascular disorders: Secondary | ICD-10-CM | POA: Diagnosis not present

## 2018-11-27 DIAGNOSIS — Z1329 Encounter for screening for other suspected endocrine disorder: Secondary | ICD-10-CM | POA: Diagnosis not present

## 2018-11-27 DIAGNOSIS — K219 Gastro-esophageal reflux disease without esophagitis: Secondary | ICD-10-CM | POA: Diagnosis not present

## 2018-11-27 DIAGNOSIS — D510 Vitamin B12 deficiency anemia due to intrinsic factor deficiency: Secondary | ICD-10-CM | POA: Diagnosis not present

## 2018-11-28 DIAGNOSIS — Z1329 Encounter for screening for other suspected endocrine disorder: Secondary | ICD-10-CM | POA: Diagnosis not present

## 2018-11-28 DIAGNOSIS — D510 Vitamin B12 deficiency anemia due to intrinsic factor deficiency: Secondary | ICD-10-CM | POA: Diagnosis not present

## 2018-11-28 DIAGNOSIS — Z136 Encounter for screening for cardiovascular disorders: Secondary | ICD-10-CM | POA: Diagnosis not present

## 2018-11-28 DIAGNOSIS — E7800 Pure hypercholesterolemia, unspecified: Secondary | ICD-10-CM | POA: Diagnosis not present

## 2018-12-25 DIAGNOSIS — Z23 Encounter for immunization: Secondary | ICD-10-CM | POA: Diagnosis not present

## 2018-12-25 DIAGNOSIS — D510 Vitamin B12 deficiency anemia due to intrinsic factor deficiency: Secondary | ICD-10-CM | POA: Diagnosis not present

## 2019-01-27 DIAGNOSIS — D510 Vitamin B12 deficiency anemia due to intrinsic factor deficiency: Secondary | ICD-10-CM | POA: Diagnosis not present

## 2019-02-07 DIAGNOSIS — R339 Retention of urine, unspecified: Secondary | ICD-10-CM | POA: Diagnosis not present

## 2019-02-14 IMAGING — DX DG CHEST 2V
2 series · 2 of 2 positions shown · non-contrast
Comparison: PET-CT 08/25/2016.  Chest radiographs 08/28/2006.

CLINICAL DATA: Bronchitis. Productive cough with bloody sputum for
1 week. History of leukemia.

EXAM:
CHEST  2 VIEW

[dg chest 2 view (1 of 2)]
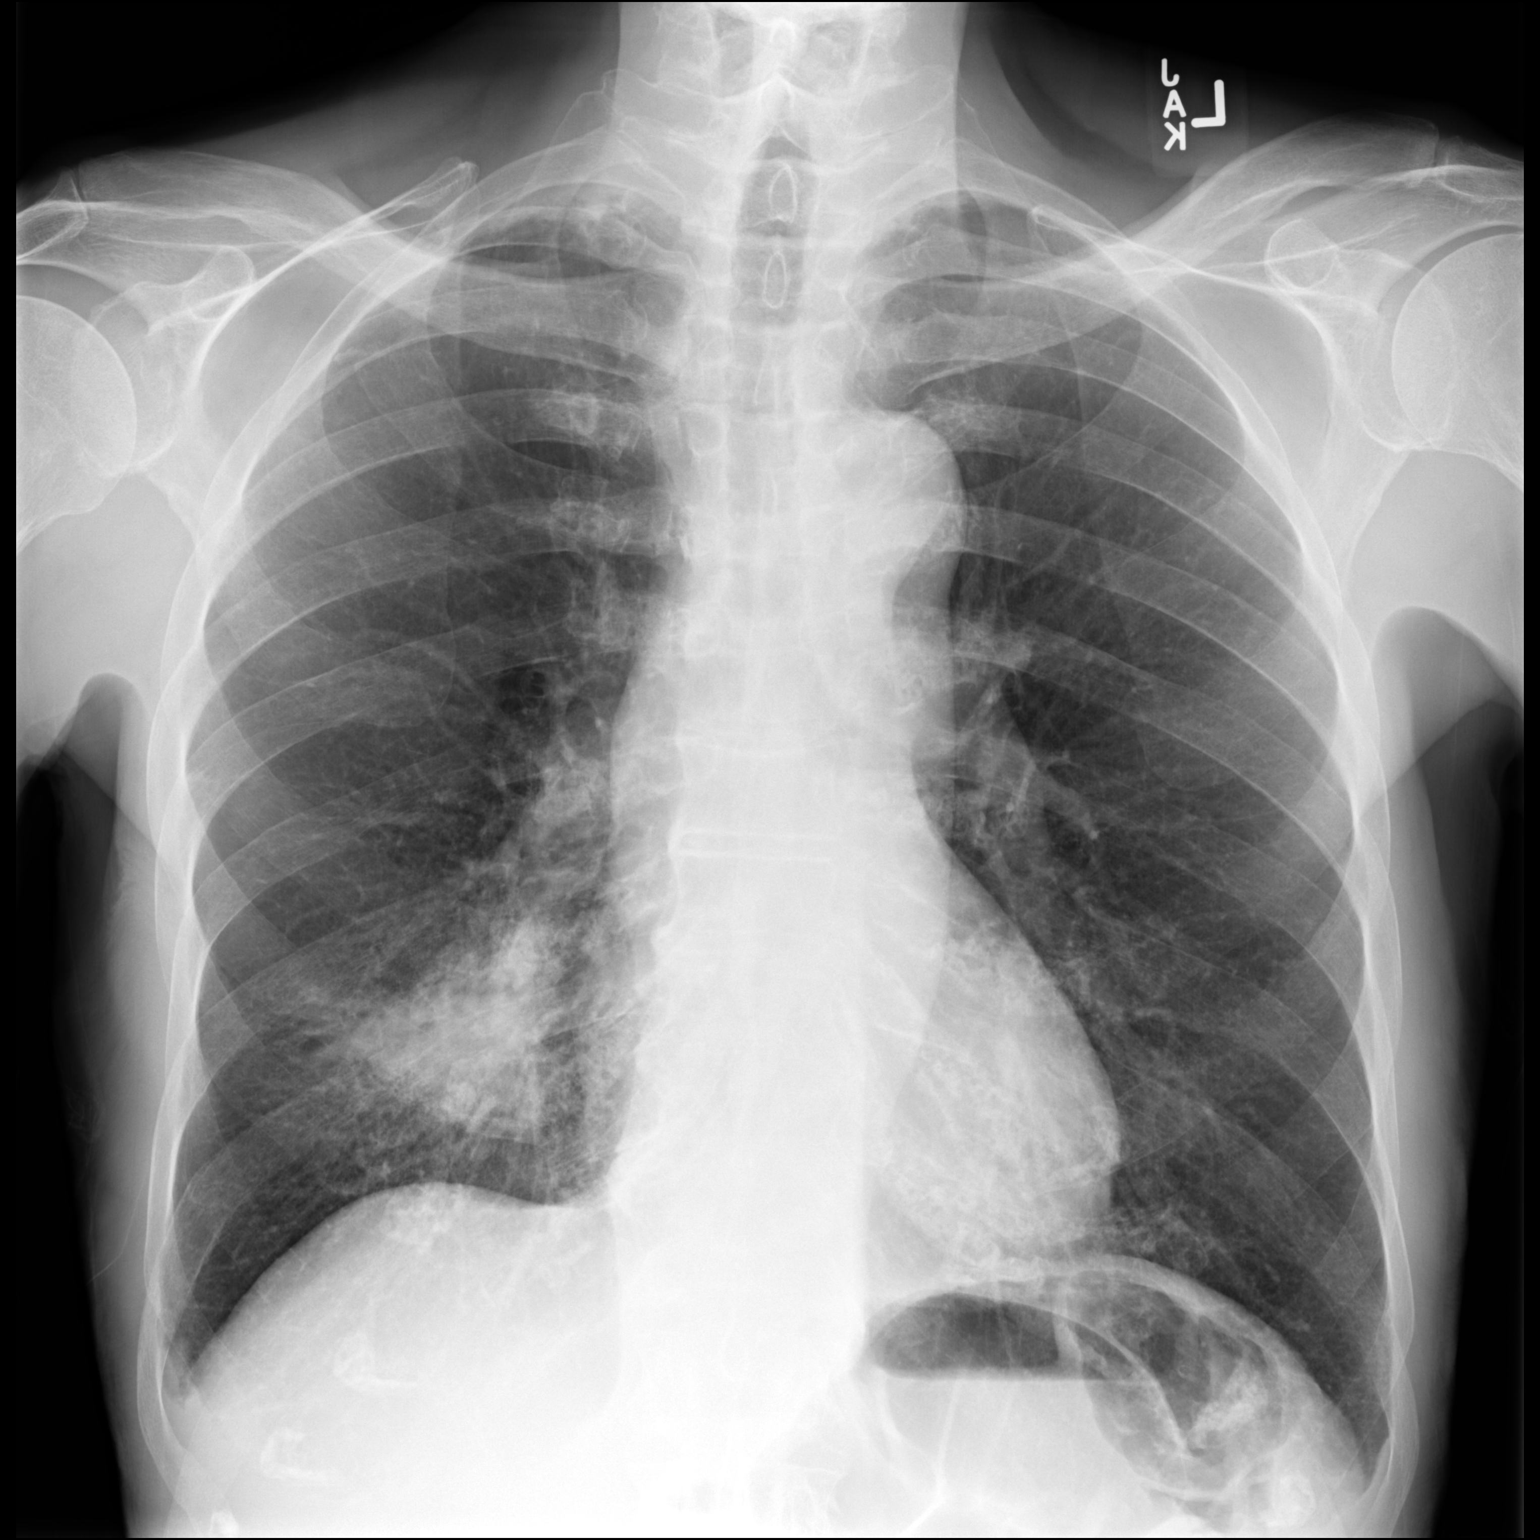

[dg chest 2 view (2 of 2)]
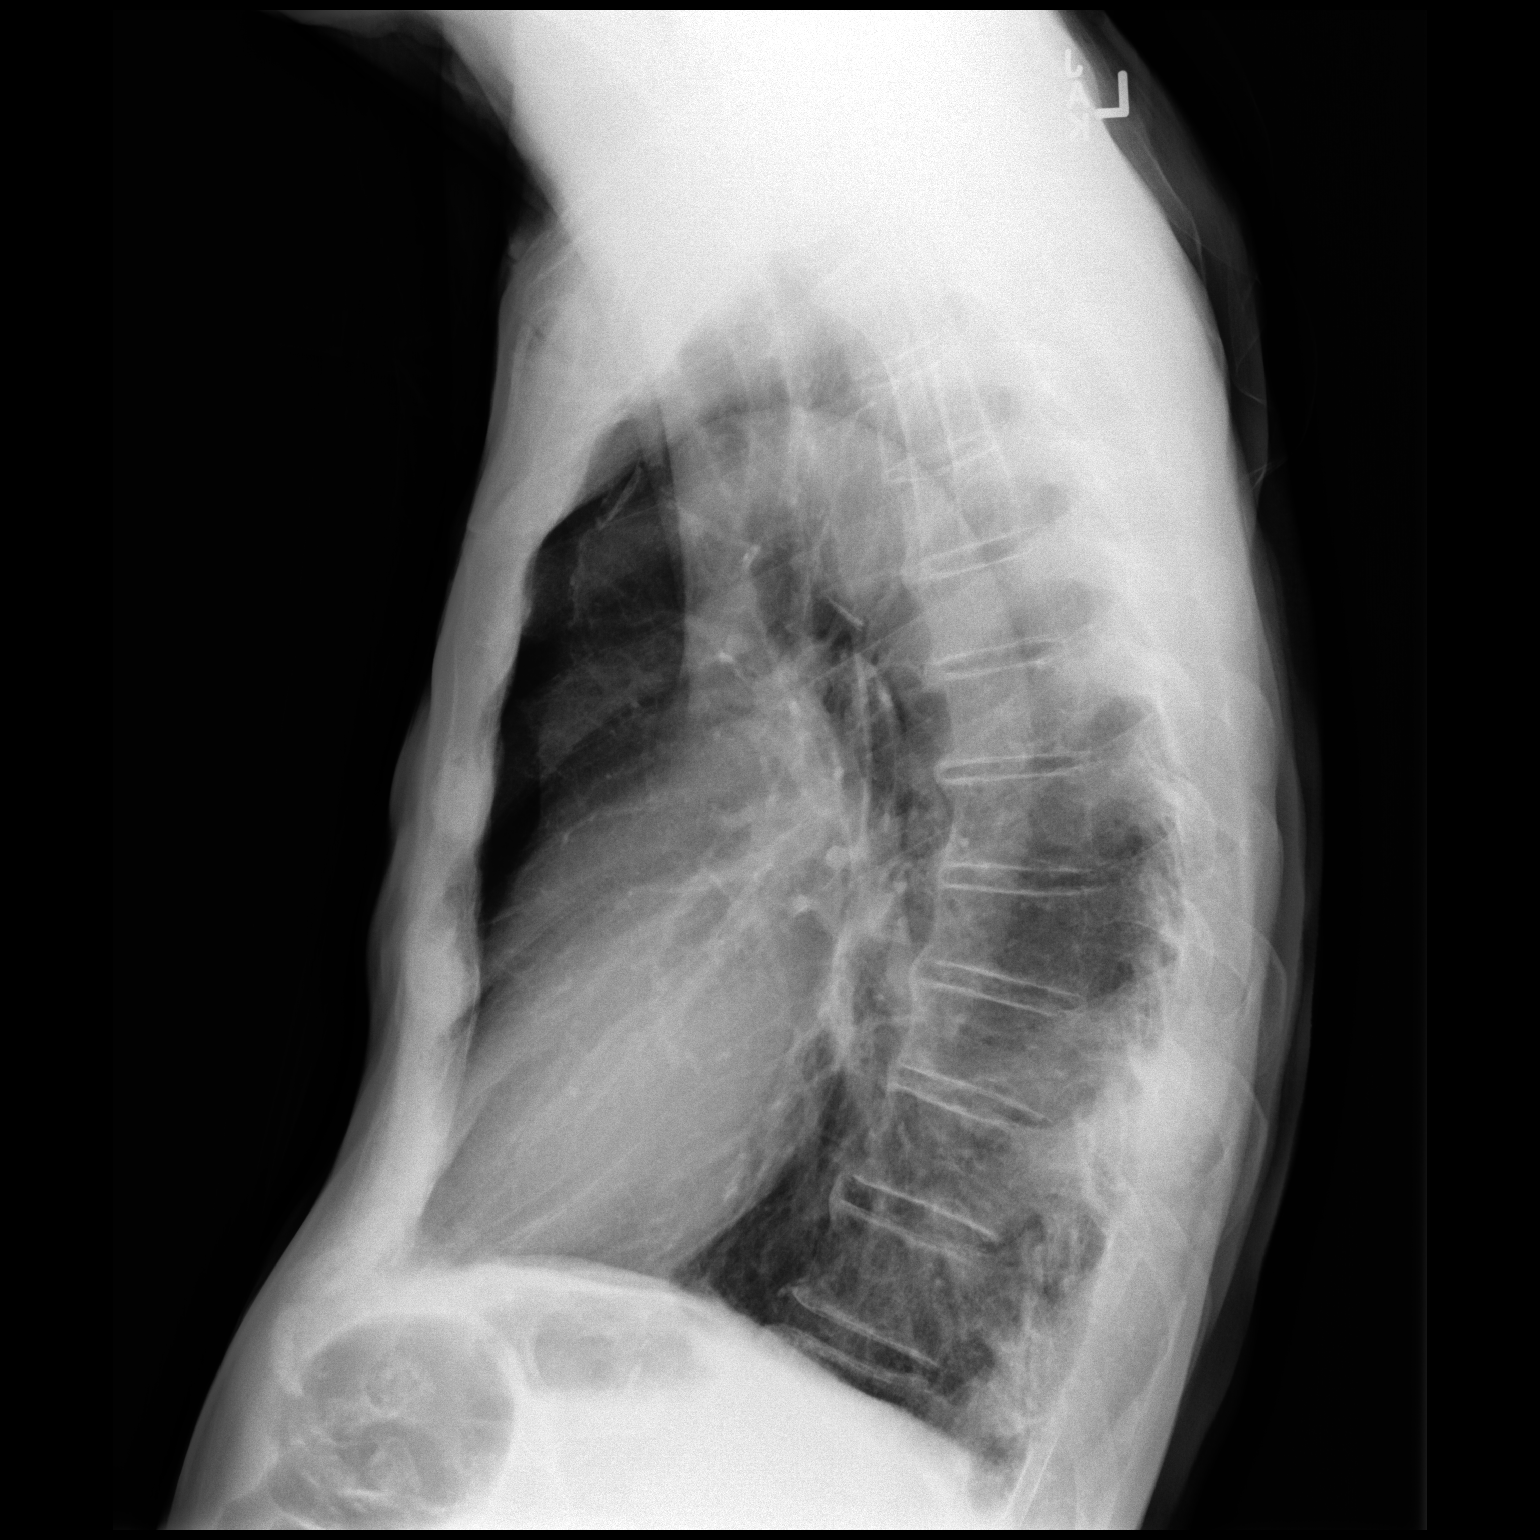

[2 of 2 positions shown; findings below may reference images not displayed]

FINDINGS: The cardiomediastinal silhouette is within normal limits. The lungs
are hyperinflated with new confluent airspace opacity posteriorly in
the right lower lobe. There is mild biapical pleural thickening. No
sizable pleural effusion or pneumothorax is identified. No acute
osseous abnormality is seen.
IMPRESSION: New right lower lobe airspace opacity compatible with pneumonia.
Followup PA and lateral chest X-ray is recommended in 3-4 weeks
following trial of antibiotic therapy to ensure resolution and
exclude underlying malignancy.

## 2019-02-25 DIAGNOSIS — D510 Vitamin B12 deficiency anemia due to intrinsic factor deficiency: Secondary | ICD-10-CM | POA: Diagnosis not present

## 2019-03-04 DIAGNOSIS — H2513 Age-related nuclear cataract, bilateral: Secondary | ICD-10-CM | POA: Diagnosis not present

## 2019-03-05 DIAGNOSIS — C9110 Chronic lymphocytic leukemia of B-cell type not having achieved remission: Secondary | ICD-10-CM | POA: Diagnosis not present

## 2019-03-05 DIAGNOSIS — D510 Vitamin B12 deficiency anemia due to intrinsic factor deficiency: Secondary | ICD-10-CM | POA: Diagnosis not present

## 2019-04-15 DIAGNOSIS — F411 Generalized anxiety disorder: Secondary | ICD-10-CM | POA: Diagnosis not present

## 2019-04-28 DIAGNOSIS — D510 Vitamin B12 deficiency anemia due to intrinsic factor deficiency: Secondary | ICD-10-CM | POA: Diagnosis not present

## 2019-05-19 DIAGNOSIS — R339 Retention of urine, unspecified: Secondary | ICD-10-CM | POA: Diagnosis not present

## 2019-05-20 DIAGNOSIS — N319 Neuromuscular dysfunction of bladder, unspecified: Secondary | ICD-10-CM | POA: Diagnosis not present

## 2019-05-20 DIAGNOSIS — K219 Gastro-esophageal reflux disease without esophagitis: Secondary | ICD-10-CM | POA: Diagnosis not present

## 2019-05-20 DIAGNOSIS — S3993XA Unspecified injury of pelvis, initial encounter: Secondary | ICD-10-CM | POA: Diagnosis not present

## 2019-05-20 DIAGNOSIS — I110 Hypertensive heart disease with heart failure: Secondary | ICD-10-CM | POA: Diagnosis not present

## 2019-05-20 DIAGNOSIS — I10 Essential (primary) hypertension: Secondary | ICD-10-CM | POA: Diagnosis present

## 2019-05-20 DIAGNOSIS — N3000 Acute cystitis without hematuria: Secondary | ICD-10-CM | POA: Diagnosis present

## 2019-05-20 DIAGNOSIS — I340 Nonrheumatic mitral (valve) insufficiency: Secondary | ICD-10-CM | POA: Diagnosis not present

## 2019-05-20 DIAGNOSIS — W1839XA Other fall on same level, initial encounter: Secondary | ICD-10-CM | POA: Diagnosis not present

## 2019-05-20 DIAGNOSIS — C9110 Chronic lymphocytic leukemia of B-cell type not having achieved remission: Secondary | ICD-10-CM | POA: Diagnosis present

## 2019-05-20 DIAGNOSIS — Z01818 Encounter for other preprocedural examination: Secondary | ICD-10-CM | POA: Diagnosis not present

## 2019-05-20 DIAGNOSIS — Y92009 Unspecified place in unspecified non-institutional (private) residence as the place of occurrence of the external cause: Secondary | ICD-10-CM | POA: Diagnosis not present

## 2019-05-20 DIAGNOSIS — R54 Age-related physical debility: Secondary | ICD-10-CM | POA: Diagnosis not present

## 2019-05-20 DIAGNOSIS — S79921A Unspecified injury of right thigh, initial encounter: Secondary | ICD-10-CM | POA: Diagnosis not present

## 2019-05-20 DIAGNOSIS — I361 Nonrheumatic tricuspid (valve) insufficiency: Secondary | ICD-10-CM | POA: Diagnosis not present

## 2019-05-20 DIAGNOSIS — R338 Other retention of urine: Secondary | ICD-10-CM | POA: Diagnosis not present

## 2019-05-20 DIAGNOSIS — S72144A Nondisplaced intertrochanteric fracture of right femur, initial encounter for closed fracture: Secondary | ICD-10-CM | POA: Diagnosis not present

## 2019-05-20 DIAGNOSIS — S72001A Fracture of unspecified part of neck of right femur, initial encounter for closed fracture: Secondary | ICD-10-CM | POA: Diagnosis not present

## 2019-05-20 DIAGNOSIS — S72144D Nondisplaced intertrochanteric fracture of right femur, subsequent encounter for closed fracture with routine healing: Secondary | ICD-10-CM | POA: Diagnosis not present

## 2019-05-20 DIAGNOSIS — Z9181 History of falling: Secondary | ICD-10-CM | POA: Diagnosis not present

## 2019-05-20 DIAGNOSIS — S72009A Fracture of unspecified part of neck of unspecified femur, initial encounter for closed fracture: Secondary | ICD-10-CM | POA: Diagnosis not present

## 2019-05-20 DIAGNOSIS — I5022 Chronic systolic (congestive) heart failure: Secondary | ICD-10-CM | POA: Diagnosis not present

## 2019-05-20 DIAGNOSIS — D510 Vitamin B12 deficiency anemia due to intrinsic factor deficiency: Secondary | ICD-10-CM | POA: Diagnosis present

## 2019-05-20 DIAGNOSIS — I351 Nonrheumatic aortic (valve) insufficiency: Secondary | ICD-10-CM | POA: Diagnosis not present

## 2019-05-20 DIAGNOSIS — M1990 Unspecified osteoarthritis, unspecified site: Secondary | ICD-10-CM | POA: Diagnosis not present

## 2019-05-20 DIAGNOSIS — W19XXXA Unspecified fall, initial encounter: Secondary | ICD-10-CM | POA: Diagnosis not present

## 2019-05-23 DIAGNOSIS — Z9181 History of falling: Secondary | ICD-10-CM | POA: Diagnosis not present

## 2019-05-23 DIAGNOSIS — N179 Acute kidney failure, unspecified: Secondary | ICD-10-CM | POA: Diagnosis not present

## 2019-05-23 DIAGNOSIS — I509 Heart failure, unspecified: Secondary | ICD-10-CM | POA: Diagnosis not present

## 2019-05-23 DIAGNOSIS — I5020 Unspecified systolic (congestive) heart failure: Secondary | ICD-10-CM | POA: Diagnosis not present

## 2019-05-23 DIAGNOSIS — K219 Gastro-esophageal reflux disease without esophagitis: Secondary | ICD-10-CM | POA: Diagnosis not present

## 2019-05-23 DIAGNOSIS — I10 Essential (primary) hypertension: Secondary | ICD-10-CM | POA: Diagnosis not present

## 2019-05-23 DIAGNOSIS — W1839XA Other fall on same level, initial encounter: Secondary | ICD-10-CM | POA: Diagnosis not present

## 2019-05-23 DIAGNOSIS — M8580 Other specified disorders of bone density and structure, unspecified site: Secondary | ICD-10-CM | POA: Diagnosis not present

## 2019-05-23 DIAGNOSIS — S7291XA Unspecified fracture of right femur, initial encounter for closed fracture: Secondary | ICD-10-CM | POA: Diagnosis not present

## 2019-05-23 DIAGNOSIS — B9689 Other specified bacterial agents as the cause of diseases classified elsewhere: Secondary | ICD-10-CM | POA: Diagnosis not present

## 2019-05-23 DIAGNOSIS — I110 Hypertensive heart disease with heart failure: Secondary | ICD-10-CM | POA: Diagnosis not present

## 2019-05-23 DIAGNOSIS — Y92009 Unspecified place in unspecified non-institutional (private) residence as the place of occurrence of the external cause: Secondary | ICD-10-CM | POA: Diagnosis not present

## 2019-05-23 DIAGNOSIS — R338 Other retention of urine: Secondary | ICD-10-CM | POA: Diagnosis not present

## 2019-05-23 DIAGNOSIS — N390 Urinary tract infection, site not specified: Secondary | ICD-10-CM | POA: Diagnosis not present

## 2019-05-23 DIAGNOSIS — C9110 Chronic lymphocytic leukemia of B-cell type not having achieved remission: Secondary | ICD-10-CM | POA: Diagnosis not present

## 2019-05-23 DIAGNOSIS — I700 Atherosclerosis of aorta: Secondary | ICD-10-CM | POA: Diagnosis not present

## 2019-05-23 DIAGNOSIS — S72144D Nondisplaced intertrochanteric fracture of right femur, subsequent encounter for closed fracture with routine healing: Secondary | ICD-10-CM | POA: Diagnosis not present

## 2019-05-23 DIAGNOSIS — G4733 Obstructive sleep apnea (adult) (pediatric): Secondary | ICD-10-CM | POA: Diagnosis not present

## 2019-05-23 DIAGNOSIS — E871 Hypo-osmolality and hyponatremia: Secondary | ICD-10-CM | POA: Diagnosis not present

## 2019-05-23 DIAGNOSIS — D510 Vitamin B12 deficiency anemia due to intrinsic factor deficiency: Secondary | ICD-10-CM | POA: Diagnosis not present

## 2019-05-23 DIAGNOSIS — I493 Ventricular premature depolarization: Secondary | ICD-10-CM | POA: Diagnosis not present

## 2019-05-23 DIAGNOSIS — N3000 Acute cystitis without hematuria: Secondary | ICD-10-CM | POA: Diagnosis not present

## 2019-05-23 DIAGNOSIS — D72829 Elevated white blood cell count, unspecified: Secondary | ICD-10-CM | POA: Diagnosis not present

## 2019-05-23 DIAGNOSIS — R2689 Other abnormalities of gait and mobility: Secondary | ICD-10-CM | POA: Diagnosis not present

## 2019-05-23 DIAGNOSIS — R531 Weakness: Secondary | ICD-10-CM | POA: Diagnosis not present

## 2019-05-23 DIAGNOSIS — I5022 Chronic systolic (congestive) heart failure: Secondary | ICD-10-CM | POA: Diagnosis not present

## 2019-05-23 DIAGNOSIS — F329 Major depressive disorder, single episode, unspecified: Secondary | ICD-10-CM | POA: Diagnosis not present

## 2019-05-23 DIAGNOSIS — S72144A Nondisplaced intertrochanteric fracture of right femur, initial encounter for closed fracture: Secondary | ICD-10-CM | POA: Diagnosis not present

## 2019-05-23 DIAGNOSIS — K5900 Constipation, unspecified: Secondary | ICD-10-CM | POA: Diagnosis not present

## 2019-05-23 DIAGNOSIS — W19XXXA Unspecified fall, initial encounter: Secondary | ICD-10-CM | POA: Diagnosis not present

## 2019-05-23 DIAGNOSIS — I719 Aortic aneurysm of unspecified site, without rupture: Secondary | ICD-10-CM | POA: Diagnosis not present

## 2019-05-23 DIAGNOSIS — N319 Neuromuscular dysfunction of bladder, unspecified: Secondary | ICD-10-CM | POA: Diagnosis not present

## 2019-06-02 DIAGNOSIS — D510 Vitamin B12 deficiency anemia due to intrinsic factor deficiency: Secondary | ICD-10-CM | POA: Diagnosis not present

## 2019-06-03 DIAGNOSIS — D510 Vitamin B12 deficiency anemia due to intrinsic factor deficiency: Secondary | ICD-10-CM | POA: Diagnosis not present

## 2019-06-03 DIAGNOSIS — K5900 Constipation, unspecified: Secondary | ICD-10-CM | POA: Diagnosis not present

## 2019-06-03 DIAGNOSIS — W19XXXD Unspecified fall, subsequent encounter: Secondary | ICD-10-CM | POA: Diagnosis not present

## 2019-06-03 DIAGNOSIS — N319 Neuromuscular dysfunction of bladder, unspecified: Secondary | ICD-10-CM | POA: Diagnosis not present

## 2019-06-03 DIAGNOSIS — R338 Other retention of urine: Secondary | ICD-10-CM | POA: Diagnosis not present

## 2019-06-03 DIAGNOSIS — I719 Aortic aneurysm of unspecified site, without rupture: Secondary | ICD-10-CM | POA: Diagnosis not present

## 2019-06-03 DIAGNOSIS — I110 Hypertensive heart disease with heart failure: Secondary | ICD-10-CM | POA: Diagnosis not present

## 2019-06-03 DIAGNOSIS — S72144D Nondisplaced intertrochanteric fracture of right femur, subsequent encounter for closed fracture with routine healing: Secondary | ICD-10-CM | POA: Diagnosis not present

## 2019-06-03 DIAGNOSIS — F329 Major depressive disorder, single episode, unspecified: Secondary | ICD-10-CM | POA: Diagnosis not present

## 2019-06-03 DIAGNOSIS — I5020 Unspecified systolic (congestive) heart failure: Secondary | ICD-10-CM | POA: Diagnosis not present

## 2019-06-03 DIAGNOSIS — C9110 Chronic lymphocytic leukemia of B-cell type not having achieved remission: Secondary | ICD-10-CM | POA: Diagnosis not present

## 2019-06-03 DIAGNOSIS — I10 Essential (primary) hypertension: Secondary | ICD-10-CM | POA: Diagnosis not present

## 2019-06-03 DIAGNOSIS — K219 Gastro-esophageal reflux disease without esophagitis: Secondary | ICD-10-CM | POA: Diagnosis not present

## 2019-06-06 DIAGNOSIS — Z4889 Encounter for other specified surgical aftercare: Secondary | ICD-10-CM | POA: Diagnosis not present

## 2019-06-09 DIAGNOSIS — S72001A Fracture of unspecified part of neck of right femur, initial encounter for closed fracture: Secondary | ICD-10-CM | POA: Diagnosis not present

## 2019-06-09 DIAGNOSIS — Z8679 Personal history of other diseases of the circulatory system: Secondary | ICD-10-CM | POA: Diagnosis not present

## 2019-06-09 DIAGNOSIS — K5900 Constipation, unspecified: Secondary | ICD-10-CM | POA: Diagnosis not present

## 2019-06-09 DIAGNOSIS — C9110 Chronic lymphocytic leukemia of B-cell type not having achieved remission: Secondary | ICD-10-CM | POA: Diagnosis not present

## 2019-06-09 DIAGNOSIS — E871 Hypo-osmolality and hyponatremia: Secondary | ICD-10-CM | POA: Diagnosis not present

## 2019-06-09 DIAGNOSIS — D510 Vitamin B12 deficiency anemia due to intrinsic factor deficiency: Secondary | ICD-10-CM | POA: Diagnosis not present

## 2019-06-09 DIAGNOSIS — Z87440 Personal history of urinary (tract) infections: Secondary | ICD-10-CM | POA: Diagnosis not present

## 2019-06-09 DIAGNOSIS — Z136 Encounter for screening for cardiovascular disorders: Secondary | ICD-10-CM | POA: Diagnosis not present

## 2019-06-13 DIAGNOSIS — Z23 Encounter for immunization: Secondary | ICD-10-CM | POA: Diagnosis not present

## 2019-06-17 DIAGNOSIS — G43009 Migraine without aura, not intractable, without status migrainosus: Secondary | ICD-10-CM | POA: Diagnosis not present

## 2019-06-23 DIAGNOSIS — D510 Vitamin B12 deficiency anemia due to intrinsic factor deficiency: Secondary | ICD-10-CM | POA: Diagnosis not present

## 2019-06-23 DIAGNOSIS — Z87440 Personal history of urinary (tract) infections: Secondary | ICD-10-CM | POA: Diagnosis not present

## 2019-06-23 DIAGNOSIS — Z136 Encounter for screening for cardiovascular disorders: Secondary | ICD-10-CM | POA: Diagnosis not present

## 2019-06-23 DIAGNOSIS — E871 Hypo-osmolality and hyponatremia: Secondary | ICD-10-CM | POA: Diagnosis not present

## 2019-07-03 DIAGNOSIS — S72144D Nondisplaced intertrochanteric fracture of right femur, subsequent encounter for closed fracture with routine healing: Secondary | ICD-10-CM | POA: Diagnosis not present

## 2019-07-07 DIAGNOSIS — R399 Unspecified symptoms and signs involving the genitourinary system: Secondary | ICD-10-CM | POA: Diagnosis not present

## 2019-07-10 DIAGNOSIS — N390 Urinary tract infection, site not specified: Secondary | ICD-10-CM | POA: Diagnosis not present

## 2019-07-10 DIAGNOSIS — C9110 Chronic lymphocytic leukemia of B-cell type not having achieved remission: Secondary | ICD-10-CM | POA: Diagnosis not present

## 2024-03-27 DEATH — deceased
